# Patient Record
Sex: Female | Born: 1980
Health system: Southern US, Community
[De-identification: ages and names within clinical notes are randomized; demographics above are authoritative.]

## PROBLEM LIST (undated history)

## (undated) ENCOUNTER — Inpatient Hospital Stay (HOSPITAL_COMMUNITY): Payer: Self-pay

## (undated) DIAGNOSIS — O24419 Gestational diabetes mellitus in pregnancy, unspecified control: Secondary | ICD-10-CM

## (undated) DIAGNOSIS — E119 Type 2 diabetes mellitus without complications: Secondary | ICD-10-CM

## (undated) DIAGNOSIS — U071 COVID-19: Secondary | ICD-10-CM

## (undated) HISTORY — DX: Type 2 diabetes mellitus without complications: E11.9

## (undated) HISTORY — DX: Morbid (severe) obesity due to excess calories: E66.01

---
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
## 2012-04-12 LAB — OB RESULTS CONSOLE HGB/HCT, BLOOD: Hemoglobin: 10.8 g/dL

## 2012-04-12 LAB — OB RESULTS CONSOLE HIV ANTIBODY (ROUTINE TESTING): HIV: NONREACTIVE

## 2012-04-12 LAB — OB RESULTS CONSOLE RPR: RPR: NONREACTIVE

## 2012-07-18 ENCOUNTER — Inpatient Hospital Stay (HOSPITAL_COMMUNITY)
Admission: AD | Admit: 2012-07-18 | Discharge: 2012-07-18 | Disposition: A | Discharge status: Home / Self Care | Payer: Self-pay | Source: Ambulatory Visit | Attending: Obstetrics & Gynecology | Admitting: Obstetrics & Gynecology

## 2012-07-18 ENCOUNTER — Encounter (HOSPITAL_COMMUNITY): Payer: Self-pay

## 2012-07-18 DIAGNOSIS — O30009 Twin pregnancy, unspecified number of placenta and unspecified number of amniotic sacs, unspecified trimester: Secondary | ICD-10-CM | POA: Insufficient documentation

## 2012-07-18 DIAGNOSIS — O99810 Abnormal glucose complicating pregnancy: Secondary | ICD-10-CM | POA: Insufficient documentation

## 2012-07-18 DIAGNOSIS — O30049 Twin pregnancy, dichorionic/diamniotic, unspecified trimester: Secondary | ICD-10-CM | POA: Diagnosis present

## 2012-07-18 DIAGNOSIS — O24919 Unspecified diabetes mellitus in pregnancy, unspecified trimester: Secondary | ICD-10-CM

## 2012-07-18 DIAGNOSIS — O24419 Gestational diabetes mellitus in pregnancy, unspecified control: Secondary | ICD-10-CM | POA: Diagnosis present

## 2012-07-18 DIAGNOSIS — O4410 Placenta previa with hemorrhage, unspecified trimester: Secondary | ICD-10-CM | POA: Insufficient documentation

## 2012-07-18 HISTORY — DX: Type 2 diabetes mellitus without complications: E11.9

## 2012-07-18 LAB — DIFFERENTIAL
Lymphocytes Relative: 35 % (ref 12–46)
Monocytes Absolute: 0.5 10*3/uL (ref 0.1–1.0)
Monocytes Relative: 8 % (ref 3–12)
Neutro Abs: 3.3 10*3/uL (ref 1.7–7.7)

## 2012-07-18 LAB — CBC
HCT: 32.4 % — ABNORMAL LOW (ref 36.0–46.0)
Hemoglobin: 10.6 g/dL — ABNORMAL LOW (ref 12.0–15.0)
MCHC: 32.7 g/dL (ref 30.0–36.0)
WBC: 5.9 10*3/uL (ref 4.0–10.5)

## 2012-07-18 LAB — RPR: RPR Ser Ql: NONREACTIVE

## 2012-07-18 LAB — TYPE AND SCREEN

## 2012-07-18 LAB — RAPID HIV SCREEN (WH-MAU): Rapid HIV Screen: NONREACTIVE

## 2012-07-18 NOTE — MAU Note (Signed)
Patient is in requesting check up. [place] She states that she is a type 1 diabetic on insulin 3 times a day, twins (fertility meds to concieve). She denies any pain, vaginal bleeding or lof. She states that the placenta is low lying.

## 2012-07-18 NOTE — MAU Provider Note (Signed)
Attestation of Attending Supervision of Advanced Practitioner (CNM/NP): Evaluation and management procedures were performed by the Advanced Practitioner under my supervision and collaboration.  I have reviewed the Advanced Practitioner's note and chart, and I agree with the management and plan.  HARRAWAY-SMITH, Tashea Othman 3:22 PM

## 2012-07-18 NOTE — MAU Provider Note (Signed)
History     CSN: [id number]  Arrival date and time: 07/18/12 1143   None     No chief complaint on file.  HPI This is a 31 y.o. female at [redacted]w[redacted]d who presents to establish OB prenatal care. She is recently from [place] and received care in [place]. She admits to twin gestation via "pill" induction. Also has gestational diabetes, and is on "insulin 3 times a day".  Does not record blood sugar measurements. She has no complaints, but states was seen in [place] for contractions but did not dilate.  RN NOTE: Patient is in requesting check up. [place] She states that she is a type 1 diabetic on insulin 3 times a day, twins (fertility meds to concieve). She denies any pain, vaginal bleeding or lof. She states that the placenta is low lying.       OB History    Grav Para Term Preterm Abortions TAB SAB Ect Mult Living   1               Past Medical History  Diagnosis Date  . Diabetes mellitus without complication     type 1    History reviewed. No pertinent past surgical history.  History reviewed. No pertinent family history.  History  Substance Use Topics  . Smoking status: Never Smoker   . Smokeless tobacco: Not on file  . Alcohol Use: No    Allergies: No Known Allergies  Prescriptions prior to admission  Medication Sig Dispense Refill  . calcium carbonate 200 MG capsule Take 333 mg by mouth daily.      . ferrous gluconate (FERGON) 246 (28 FE) MG tablet Take 100 mg by mouth daily with breakfast.      . folic acid (FOLVITE) 400 MCG tablet Take 350 mcg by mouth daily.      . Magnesium 100 MG TABS Take 150 mg by mouth daily.      . progesterone (ENDOMETRIN) 100 MG vaginal insert Place 400 mg vaginally daily at 2 PM daily at 2 PM.      . pyridOXINE (VITAMIN B-6) 50 MG tablet Take 5 mg by mouth daily.        ROS See HPI  Physical Exam   Blood pressure 121/60, pulse 90, temperature 97.9 F (36.6 C),  temperature source Oral, resp. rate 16.  Physical Exam  Constitutional: She is oriented to person, place, and time. She appears well-developed and well-nourished. No distress.  Cardiovascular: Normal rate, regular rhythm and normal heart sounds.   Respiratory: Effort normal and breath sounds normal. She has no wheezes. She has no rales.  GI: Soft. She exhibits no distension. There is no tenderness. There is no rebound and no guarding.  Musculoskeletal: Normal range of motion.  Neurological: She is alert and oriented to person, place, and time.  Skin: Skin is warm and dry.  Psychiatric: She has a normal mood and affect.  FHR reassuring in both twins No contractions  MAU Course  Procedures   Assessment and Plan  A:  Twin IUP at [redacted]w[redacted]d        Low lying placenta       Gest Diabetes       Late transfer of care     P:  Consulted Dr Erin Fulling       Will draw OB panel plus Hgb A1c       Schedule outpt [place]  Records sent to be photoscanned       [facility] 07/18/2012, 12:46 PM

## 2012-07-20 LAB — HEMOGLOBINOPATHY EVALUATION
Hgb A2 Quant: 2.9 % (ref 2.2–3.2)
Hgb F Quant: 0 % (ref 0.0–2.0)
Hgb S Quant: 0 %

## 2012-07-22 ENCOUNTER — Other Ambulatory Visit (HOSPITAL_COMMUNITY): Payer: Self-pay | Admitting: Advanced Practice Midwife

## 2012-07-22 DIAGNOSIS — O30049 Twin pregnancy, dichorionic/diamniotic, unspecified trimester: Secondary | ICD-10-CM

## 2012-07-22 DIAGNOSIS — O24919 Unspecified diabetes mellitus in pregnancy, unspecified trimester: Secondary | ICD-10-CM

## 2012-07-29 ENCOUNTER — Ambulatory Visit (HOSPITAL_COMMUNITY)
Admission: RE | Admit: 2012-07-29 | Discharge: 2012-07-29 | Disposition: A | Discharge status: Home / Self Care | Payer: Self-pay | Source: Ambulatory Visit | Attending: Advanced Practice Midwife | Admitting: Advanced Practice Midwife

## 2012-07-29 ENCOUNTER — Other Ambulatory Visit (HOSPITAL_COMMUNITY): Payer: Self-pay | Admitting: Advanced Practice Midwife

## 2012-07-29 DIAGNOSIS — O30049 Twin pregnancy, dichorionic/diamniotic, unspecified trimester: Secondary | ICD-10-CM

## 2012-07-29 DIAGNOSIS — O99810 Abnormal glucose complicating pregnancy: Secondary | ICD-10-CM | POA: Insufficient documentation

## 2012-07-29 DIAGNOSIS — O30009 Twin pregnancy, unspecified number of placenta and unspecified number of amniotic sacs, unspecified trimester: Secondary | ICD-10-CM | POA: Insufficient documentation

## 2012-07-29 DIAGNOSIS — O24919 Unspecified diabetes mellitus in pregnancy, unspecified trimester: Secondary | ICD-10-CM

## 2012-08-01 ENCOUNTER — Encounter: Payer: Self-pay | Admitting: Obstetrics & Gynecology

## 2012-08-01 ENCOUNTER — Encounter: Payer: Self-pay | Attending: Obstetrics & Gynecology | Admitting: Dietician

## 2012-08-01 ENCOUNTER — Ambulatory Visit (INDEPENDENT_AMBULATORY_CARE_PROVIDER_SITE_OTHER): Payer: Self-pay | Admitting: Obstetrics & Gynecology

## 2012-08-01 VITALS — BP 135/76 | Temp 98.1°F | Wt 251.3 lb

## 2012-08-01 DIAGNOSIS — O30049 Twin pregnancy, dichorionic/diamniotic, unspecified trimester: Secondary | ICD-10-CM

## 2012-08-01 DIAGNOSIS — O24419 Gestational diabetes mellitus in pregnancy, unspecified control: Secondary | ICD-10-CM

## 2012-08-01 DIAGNOSIS — O99810 Abnormal glucose complicating pregnancy: Secondary | ICD-10-CM | POA: Insufficient documentation

## 2012-08-01 DIAGNOSIS — O30009 Twin pregnancy, unspecified number of placenta and unspecified number of amniotic sacs, unspecified trimester: Secondary | ICD-10-CM

## 2012-08-01 DIAGNOSIS — Z713 Dietary counseling and surveillance: Secondary | ICD-10-CM | POA: Insufficient documentation

## 2012-08-01 DIAGNOSIS — Z23 Encounter for immunization: Secondary | ICD-10-CM

## 2012-08-01 LAB — POCT URINALYSIS DIP (DEVICE)
Bilirubin Urine: NEGATIVE
Ketones, ur: NEGATIVE mg/dL
pH: 7 (ref 5.0–8.0)

## 2012-08-01 MED ORDER — INFLUENZA VIRUS VACC SPLIT PF IM SUSP
0.5000 mL | Freq: Once | INTRAMUSCULAR | Status: AC
  Administered 2012-08-01: 0.5 mL via INTRAMUSCULAR

## 2012-08-01 MED ORDER — TETANUS-DIPHTH-ACELL PERTUSSIS 5-2.5-18.5 LF-MCG/0.5 IM SUSP
0.5000 mL | Freq: Once | INTRAMUSCULAR | Status: AC
  Administered 2012-08-01: 0.5 mL via INTRAMUSCULAR

## 2012-08-01 NOTE — Patient Instructions (Signed)
Return to clinic for any obstetric concerns or go to MAU for evaluation  

## 2012-08-01 NOTE — Progress Notes (Signed)
Diabetes Education:  [name] pregnant with twins comes in today for GDM counseling.  Has no previous history of diabetes but, has a positive family history for DM type 2 (mother and father).  Became pregnant in [place] and there received some diabetes education and diet instruction.  Was started on Levemir 10 units at HS and Humalog 6 units AC each meal.  Currently, has 1 Levemir insulin pen and 3 Humalog insulin pens at her home.  Will probably need to be switched to NPH on a BID basis given that Levemir is not approved for use in pregnancy in the [place].  She has a One Touch Ultra meter for monitoring.  She did not bring the meter or her glucose log today.  She reports fasting glucose levels at about 110 mg/dl.  I had difficulty discovering what her post-meal glucose levels were running.  She currently has approximately 200 strips left to use.  Given that she is uninsured and the strips will run her approximately $ 100.00 for 100 strips, I provided her a True Tack meter to use when her One Touch Strips run out.  Today, on return demonstration, her glucose was 102 at 11:00 AM following a glass of milk this morning.  We reviewed the CHO restricted diet, the times and the goals of monitoring.  I was assisted by the Arabic interpreter, Lisa [name].  Lisa [name]'s sister was present and she speaks and understands [place].  Lisa [name] lives with her and she is very attentive and helpful.  Maggie Shanira Tine, RN, RD, CDE

## 2012-08-01 NOTE — Progress Notes (Signed)
First prenatal visit today in this clinic, received care in [place]. Records reviewed, problem list updated.  Diagnosed with GDM based on fasting BS of 143; HbA1C of 6.7.  She is on insulin but has not checked her BS in a while.  HbA1C on 11/18 was 5.9.  Growth scan on 11/29 only showed 2% discordance, cervix 2 cm long.  Normal physical exam, pap smear done. Patient to receive DM teaching today, flu shot, Tdap vaccine. No other complaints or concerns.  Fetal movement and labor precautions reviewed.

## 2012-08-01 NOTE — Progress Notes (Signed)
Pulse: 106

## 2012-08-08 ENCOUNTER — Ambulatory Visit (INDEPENDENT_AMBULATORY_CARE_PROVIDER_SITE_OTHER): Payer: Self-pay | Admitting: Obstetrics and Gynecology

## 2012-08-08 ENCOUNTER — Encounter: Payer: Self-pay | Admitting: Dietician

## 2012-08-08 ENCOUNTER — Inpatient Hospital Stay (HOSPITAL_COMMUNITY)
Admission: AD | Admit: 2012-08-08 | Discharge: 2012-08-08 | Disposition: A | Discharge status: Home / Self Care | Payer: Self-pay | Source: Ambulatory Visit | Attending: Obstetrics & Gynecology | Admitting: Obstetrics & Gynecology

## 2012-08-08 ENCOUNTER — Encounter (HOSPITAL_COMMUNITY): Payer: Self-pay | Admitting: *Deleted

## 2012-08-08 VITALS — BP 127/78 | Temp 96.5°F | Wt 246.2 lb

## 2012-08-08 DIAGNOSIS — O30049 Twin pregnancy, dichorionic/diamniotic, unspecified trimester: Secondary | ICD-10-CM | POA: Insufficient documentation

## 2012-08-08 DIAGNOSIS — O24419 Gestational diabetes mellitus in pregnancy, unspecified control: Secondary | ICD-10-CM

## 2012-08-08 DIAGNOSIS — O99810 Abnormal glucose complicating pregnancy: Secondary | ICD-10-CM

## 2012-08-08 DIAGNOSIS — O30009 Twin pregnancy, unspecified number of placenta and unspecified number of amniotic sacs, unspecified trimester: Secondary | ICD-10-CM | POA: Insufficient documentation

## 2012-08-08 DIAGNOSIS — O26879 Cervical shortening, unspecified trimester: Secondary | ICD-10-CM

## 2012-08-08 LAB — POCT URINALYSIS DIP (DEVICE)
Glucose, UA: NEGATIVE mg/dL
Nitrite: NEGATIVE
Urobilinogen, UA: 0.2 mg/dL (ref 0.0–1.0)

## 2012-08-08 MED ORDER — "INSULIN SYRINGE-NEEDLE U-100 31G X 5/16"" 1 ML MISC": 1.0000 | Freq: Two times a day (BID) | Status: DC

## 2012-08-08 MED ORDER — INSULIN NPH (HUMAN) (ISOPHANE) 100 UNIT/ML ~~LOC~~ SUSP: 10.0000 [IU] | Freq: Every day | SUBCUTANEOUS | Status: DC

## 2012-08-08 NOTE — MAU Note (Signed)
Sent from the clinic for her scheduled NST;

## 2012-08-08 NOTE — MAU Provider Note (Signed)
MAU Attending Note  31 y.o. G1P0 with twins at [redacted]w[redacted]d here for NST. NST performed today was reviewed and was found to be reactive X 2.  No contractions noted. Will continue recommended antenatal testing and prenatal care. No other complaints or concerns.  Patient sent home with fetal movement and labor precautions reviewed.   Jaynie Collins, MD, FACOG Attending Obstetrician & Gynecologist Faculty Practice, [facility] [place]

## 2012-08-08 NOTE — Progress Notes (Signed)
Diabetes Education:  Switching from Levemir to Humulin NPH sulin.  Has been using the insulin pen in the past.  Will be using insulin syringes.  Review of drawing up and injecting insulin using a syringe.  She relates she has seen her mother use a syringe in the past.  Completed a return demonstration for drawing up and injecting insulin using the injection pad.  She has been injecting her insulin for a number of months. Provided a BD insulin take home kit.  Maggie Lorita Forinash, RN, RD, CDE

## 2012-08-08 NOTE — Progress Notes (Signed)
Short cx. Denies PTL sx.  Revieweds/sx. Twins/ A2 GDM. Will schedule FATs> Diane unable to take today. NST in MAU.  CBGs within range for last 3 days though had mildly elevated fasting  and pc dinners before that. D/W Maggie> Finish Detemir (5 doses) then start Humulin N 10 u qhs. Nasal congestion> neosynephrine nose GTTS.

## 2012-08-08 NOTE — Addendum Note (Signed)
Addended by: Danae Orleans on: 08/08/2012 09:48 AM   Modules accepted: Orders

## 2012-08-08 NOTE — Progress Notes (Signed)
Pulse- 100  Pain/pressure- lower abd

## 2012-08-08 NOTE — Patient Instructions (Signed)
Fetal Movement Counts Patient Name: __________________________________________________ Patient Due Date: ____________________ Kick counts is highly recommended in high risk pregnancies, but it is a good idea for every pregnant woman to do. Start counting fetal movements at 28 weeks of the pregnancy. Fetal movements increase after eating a full meal or eating or drinking something sweet (the blood sugar is higher). It is also important to drink plenty of fluids (well hydrated) before doing the count. Lie on your left side because it helps with the circulation or you can sit in a comfortable chair with your arms over your belly (abdomen) with no distractions around you. DOING THE COUNT  Try to do the count the same time of day each time you do it.  Mark the day and time, then see how long it takes for you to feel 10 movements (kicks, flutters, swishes, rolls). You should have at least 10 movements within 2 hours. You will most likely feel 10 movements in much less than 2 hours. If you do not, wait an hour and count again. After a couple of days you will see a pattern.  What you are looking for is a change in the pattern or not enough counts in 2 hours. Is it taking longer in time to reach 10 movements? SEEK MEDICAL CARE IF:  You feel less than 10 counts in 2 hours. Tried twice.  No movement in one hour.  The pattern is changing or taking longer each day to reach 10 counts in 2 hours.  You feel the baby is not moving as it usually does. Date: ____________ Movements: ____________ Start time: ____________ Finish time: ____________  Date: ____________ Movements: ____________ Start time: ____________ Finish time: ____________ Date: ____________ Movements: ____________ Start time: ____________ Finish time: ____________ Date: ____________ Movements: ____________ Start time: ____________ Finish time: ____________ Date: ____________ Movements: ____________ Start time: ____________ Finish time:  ____________ Date: ____________ Movements: ____________ Start time: ____________ Finish time: ____________ Date: ____________ Movements: ____________ Start time: ____________ Finish time: ____________ Date: ____________ Movements: ____________ Start time: ____________ Finish time: ____________  Date: ____________ Movements: ____________ Start time: ____________ Finish time: ____________ Date: ____________ Movements: ____________ Start time: ____________ Finish time: ____________ Date: ____________ Movements: ____________ Start time: ____________ Finish time: ____________ Date: ____________ Movements: ____________ Start time: ____________ Finish time: ____________ Date: ____________ Movements: ____________ Start time: ____________ Finish time: ____________ Date: ____________ Movements: ____________ Start time: ____________ Finish time: ____________ Date: ____________ Movements: ____________ Start time: ____________ Finish time: ____________  Date: ____________ Movements: ____________ Start time: ____________ Finish time: ____________ Date: ____________ Movements: ____________ Start time: ____________ Finish time: ____________ Date: ____________ Movements: ____________ Start time: ____________ Finish time: ____________ Date: ____________ Movements: ____________ Start time: ____________ Finish time: ____________ Date: ____________ Movements: ____________ Start time: ____________ Finish time: ____________ Date: ____________ Movements: ____________ Start time: ____________ Finish time: ____________ Date: ____________ Movements: ____________ Start time: ____________ Finish time: ____________  Date: ____________ Movements: ____________ Start time: ____________ Finish time: ____________ Date: ____________ Movements: ____________ Start time: ____________ Finish time: ____________ Date: ____________ Movements: ____________ Start time: ____________ Finish time: ____________ Date: ____________ Movements:  ____________ Start time: ____________ Finish time: ____________ Date: ____________ Movements: ____________ Start time: ____________ Finish time: ____________ Date: ____________ Movements: ____________ Start time: ____________ Finish time: ____________ Date: ____________ Movements: ____________ Start time: ____________ Finish time: ____________  Date: ____________ Movements: ____________ Start time: ____________ Finish time: ____________ Date: ____________ Movements: ____________ Start time: ____________ Finish time: ____________ Date: ____________ Movements: ____________ Start time: ____________ Finish time: ____________ Date: ____________ Movements:   ____________ Start time: ____________ Finish time: ____________ Date: ____________ Movements: ____________ Start time: ____________ Finish time: ____________ Date: ____________ Movements: ____________ Start time: ____________ Finish time: ____________ Date: ____________ Movements: ____________ Start time: ____________ Finish time: ____________  Date: ____________ Movements: ____________ Start time: ____________ Finish time: ____________ Date: ____________ Movements: ____________ Start time: ____________ Finish time: ____________ Date: ____________ Movements: ____________ Start time: ____________ Finish time: ____________ Date: ____________ Movements: ____________ Start time: ____________ Finish time: ____________ Date: ____________ Movements: ____________ Start time: ____________ Finish time: ____________ Date: ____________ Movements: ____________ Start time: ____________ Finish time: ____________ Date: ____________ Movements: ____________ Start time: ____________ Finish time: ____________  Date: ____________ Movements: ____________ Start time: ____________ Finish time: ____________ Date: ____________ Movements: ____________ Start time: ____________ Finish time: ____________ Date: ____________ Movements: ____________ Start time: ____________ Finish  time: ____________ Date: ____________ Movements: ____________ Start time: ____________ Finish time: ____________ Date: ____________ Movements: ____________ Start time: ____________ Finish time: ____________ Date: ____________ Movements: ____________ Start time: ____________ Finish time: ____________ Date: ____________ Movements: ____________ Start time: ____________ Finish time: ____________  Date: ____________ Movements: ____________ Start time: ____________ Finish time: ____________ Date: ____________ Movements: ____________ Start time: ____________ Finish time: ____________ Date: ____________ Movements: ____________ Start time: ____________ Finish time: ____________ Date: ____________ Movements: ____________ Start time: ____________ Finish time: ____________ Date: ____________ Movements: ____________ Start time: ____________ Finish time: ____________ Date: ____________ Movements: ____________ Start time: ____________ Finish time: ____________ Document Released: 09/16/2006 Document Revised: 11/09/2011 Document Reviewed: 03/19/2009 Exit[employer] Patient Information 2013 [employer].  

## 2012-08-11 ENCOUNTER — Emergency Department (HOSPITAL_BASED_OUTPATIENT_CLINIC_OR_DEPARTMENT_OTHER)
Admission: EM | Admit: 2012-08-11 | Discharge: 2012-08-11 | Disposition: A | Discharge status: Home / Self Care | Payer: Self-pay | Attending: Emergency Medicine | Admitting: Emergency Medicine

## 2012-08-11 ENCOUNTER — Encounter (HOSPITAL_BASED_OUTPATIENT_CLINIC_OR_DEPARTMENT_OTHER): Payer: Self-pay | Admitting: *Deleted

## 2012-08-11 DIAGNOSIS — O24919 Unspecified diabetes mellitus in pregnancy, unspecified trimester: Secondary | ICD-10-CM | POA: Insufficient documentation

## 2012-08-11 DIAGNOSIS — O9989 Other specified diseases and conditions complicating pregnancy, childbirth and the puerperium: Secondary | ICD-10-CM | POA: Insufficient documentation

## 2012-08-11 DIAGNOSIS — O30009 Twin pregnancy, unspecified number of placenta and unspecified number of amniotic sacs, unspecified trimester: Secondary | ICD-10-CM | POA: Insufficient documentation

## 2012-08-11 DIAGNOSIS — Z794 Long term (current) use of insulin: Secondary | ICD-10-CM | POA: Insufficient documentation

## 2012-08-11 DIAGNOSIS — J209 Acute bronchitis, unspecified: Secondary | ICD-10-CM | POA: Insufficient documentation

## 2012-08-11 DIAGNOSIS — J029 Acute pharyngitis, unspecified: Secondary | ICD-10-CM | POA: Insufficient documentation

## 2012-08-11 DIAGNOSIS — H9209 Otalgia, unspecified ear: Secondary | ICD-10-CM | POA: Insufficient documentation

## 2012-08-11 MED ORDER — AMOXICILLIN 500 MG PO CAPS: 500.0000 mg | ORAL_CAPSULE | Freq: Three times a day (TID) | ORAL | Status: DC

## 2012-08-11 NOTE — ED Notes (Signed)
Pt amb to room 6 with quick steady gait in nad. Pt reports cough, congestion and sob x 3 days with ear pain and sore throat.

## 2012-08-11 NOTE — ED Provider Notes (Signed)
History     CSN: [id number]  Arrival date & time 08/11/12  0907   First MD Initiated Contact with Patient 08/11/12 1022      Chief Complaint  Patient presents with  . Cough  . Nasal Congestion  . Otalgia    (Consider location/radiation/quality/duration/timing/severity/associated sxs/prior treatment) HPI Comments: Patient is a 31 year old woman from [place] who his pregnant with twins, estimated date of confinement 10/05/2012. She is followed for her pregnancy at [facility]. She is diabetic, on insulin. Today's blood sugar was reportedly 81. She's had a bad cold with ear and throat pain, and a productive cough. She's been sick for over a week with this illness.  Patient is a 31 y.o. female presenting with cough and ear pain. The history is provided by the patient.  Cough This is a new (Onset 1 week ago.) problem. The problem occurs constantly. The problem has not changed since onset.The cough is productive of sputum. There has been no fever. Associated symptoms include ear pain and sore throat. Pertinent negatives include no chills. She has tried nothing for the symptoms. She is not a smoker.  Otalgia Associated symptoms include sore throat and cough.    Past Medical History  Diagnosis Date  . Diabetes mellitus without complication     type 1    History reviewed. No pertinent past surgical history.  Family History  Problem Relation Age of Onset  . Other Neg Hx     History  Substance Use Topics  . Smoking status: Never Smoker   . Smokeless tobacco: Not on file  . Alcohol Use: No    OB History    Grav Para Term Preterm Abortions TAB SAB Ect Mult Living   1               Review of Systems  Constitutional: Negative for fever and chills.  HENT: Positive for ear pain and sore throat.   Respiratory: Positive for cough.   Cardiovascular: Negative.   Gastrointestinal: Negative.   Genitourinary:       Is pregnant with twins.  Musculoskeletal: Negative.    Skin: Negative.   Neurological: Negative.   Psychiatric/Behavioral: Negative.     Allergies  Review of patient's allergies indicates no known allergies.  Home Medications   Current Outpatient Rx  Name  Route  Sig  Dispense  Refill  . CALCIUM CARBONATE 200 MG PO CAPS   Oral   Take 333 mg by mouth daily.         Marland Kitchen FERROUS GLUCONATE IRON 246 (28 FE) MG PO TABS   Oral   Take 100 mg by mouth daily with breakfast.         . INSULIN DETEMIR 100 UNIT/ML [place] SOLN   Subcutaneous   Inject 10 Units into the skin at bedtime.         . INSULIN LISPRO (HUMAN) 100 UNIT/ML [place] SOLN   Subcutaneous   Inject 6 Units into the skin 3 (three) times daily before meals.         . INSULIN ISOPHANE HUMAN 100 UNIT/ML [place] SUSP   Subcutaneous   Inject 10 Units into the skin at bedtime.         . INSULIN SYRINGE-NEEDLE U-100 31G X 5/16" 1 ML MISC   Does not apply   1 Syringe by Does not apply route 2 (two) times daily.   100 each   3   . PRENATAL 27-0.8 MG PO TABS   Oral  Take 1 tablet by mouth daily.           BP 121/72  Pulse 84  Temp 97.9 F (36.6 C) (Oral)  Resp 18  Ht 5\' 6"  (1.676 m)  Wt 246 lb (111.585 kg)  BMI 39.71 kg/m2  SpO2 97%  Physical Exam  Nursing note and vitals reviewed. Constitutional: She is oriented to person, place, and time. She appears well-developed and well-nourished. No distress.  HENT:  Head: Normocephalic and atraumatic.  Right Ear: External ear normal.  Left Ear: External ear normal.  Mouth/Throat: Oropharynx is clear and moist.       Hears her tear by cerumen. She has periodic rhinorrhea. The throat is mildly red.  Eyes: Conjunctivae normal and EOM are normal. Pupils are equal, round, and reactive to light.  Neck: Normal range of motion. Neck supple.  Cardiovascular: Normal rate, regular rhythm and normal heart sounds.   Pulmonary/Chest: Effort normal and breath sounds normal. Respiratory distress: she is gravid, with the uterus above her  umbilicus. The abdomen is nontender.  Abdominal: Soft.  Musculoskeletal: Normal range of motion. She exhibits no edema.  Lymphadenopathy:    She has no cervical adenopathy.  Neurological: She is alert and oriented to person, place, and time.       No sensory or motor deficit.  Skin: Skin is warm and dry.  Psychiatric: She has a normal mood and affect. Her behavior is normal.    ED Course  Procedures (including critical care time)  Rx for bronchitis with amoxicillin500 mg tid x 7 days.    1. Acute bronchitis   2. Twin pregnancy        Carleene Cooper III, MD 08/11/12 1040

## 2012-08-12 ENCOUNTER — Ambulatory Visit (INDEPENDENT_AMBULATORY_CARE_PROVIDER_SITE_OTHER): Payer: Self-pay | Admitting: *Deleted

## 2012-08-12 VITALS — BP 122/77

## 2012-08-12 DIAGNOSIS — O99810 Abnormal glucose complicating pregnancy: Secondary | ICD-10-CM

## 2012-08-12 DIAGNOSIS — O30009 Twin pregnancy, unspecified number of placenta and unspecified number of amniotic sacs, unspecified trimester: Secondary | ICD-10-CM

## 2012-08-12 MED ORDER — FLUCONAZOLE 150 MG PO TABS: 150.0000 mg | ORAL_TABLET | Freq: Once | ORAL | Status: DC

## 2012-08-12 NOTE — Progress Notes (Signed)
P - 97 

## 2012-08-15 ENCOUNTER — Ambulatory Visit (INDEPENDENT_AMBULATORY_CARE_PROVIDER_SITE_OTHER): Payer: Self-pay | Admitting: Family Medicine

## 2012-08-15 ENCOUNTER — Encounter (HOSPITAL_COMMUNITY): Payer: Self-pay

## 2012-08-15 ENCOUNTER — Inpatient Hospital Stay (HOSPITAL_COMMUNITY)
Admission: AD | Admit: 2012-08-15 | Discharge: 2012-08-15 | Disposition: A | Discharge status: Home / Self Care | Payer: Self-pay | Source: Ambulatory Visit | Attending: Obstetrics & Gynecology | Admitting: Obstetrics & Gynecology

## 2012-08-15 VITALS — BP 127/72 | Wt 247.4 lb

## 2012-08-15 DIAGNOSIS — O30049 Twin pregnancy, dichorionic/diamniotic, unspecified trimester: Secondary | ICD-10-CM

## 2012-08-15 DIAGNOSIS — O30009 Twin pregnancy, unspecified number of placenta and unspecified number of amniotic sacs, unspecified trimester: Secondary | ICD-10-CM | POA: Insufficient documentation

## 2012-08-15 DIAGNOSIS — O99891 Other specified diseases and conditions complicating pregnancy: Secondary | ICD-10-CM | POA: Insufficient documentation

## 2012-08-15 DIAGNOSIS — M545 Low back pain, unspecified: Secondary | ICD-10-CM | POA: Insufficient documentation

## 2012-08-15 DIAGNOSIS — O24419 Gestational diabetes mellitus in pregnancy, unspecified control: Secondary | ICD-10-CM

## 2012-08-15 DIAGNOSIS — O99810 Abnormal glucose complicating pregnancy: Secondary | ICD-10-CM | POA: Insufficient documentation

## 2012-08-15 DIAGNOSIS — O0990 Supervision of high risk pregnancy, unspecified, unspecified trimester: Secondary | ICD-10-CM | POA: Insufficient documentation

## 2012-08-15 DIAGNOSIS — O3430 Maternal care for cervical incompetence, unspecified trimester: Secondary | ICD-10-CM

## 2012-08-15 DIAGNOSIS — O479 False labor, unspecified: Secondary | ICD-10-CM

## 2012-08-15 LAB — POCT URINALYSIS DIP (DEVICE)
Bilirubin Urine: NEGATIVE
Glucose, UA: NEGATIVE mg/dL
Nitrite: NEGATIVE
Specific Gravity, Urine: >=1.03 (ref 1.005–1.030)

## 2012-08-15 MED ORDER — BETAMETHASONE SOD PHOS & ACET 6 (3-3) MG/ML IJ SUSP
12.0000 mg | Freq: Once | INTRAMUSCULAR | Status: AC
  Administered 2012-08-15: 12 mg via INTRAMUSCULAR
  Filled 2012-08-15: qty 2

## 2012-08-15 NOTE — MAU Provider Note (Signed)
History     CSN: [id number]  Arrival date and time: 08/15/12 1015   None     No chief complaint on file.  HPI 31 y.o. G1P0 at [redacted]w[redacted]d with di/di twins. Sent from clinic today for pelvic pain/pressure and low back pain since yesterday. No bleeding, contractions or loss of fluid. Babies moving well. Vaginal pain now gone.  Found to be 2.5/90/-1 on exam in clinic. Denies dysuria or vaginal discharge. No N/V.   A2GDM on insulin, did not bring lot today. States fasting today 103, yesterday 135 but generally 80-90s. PP highest 95.    OB History    Grav Para Term Preterm Abortions TAB SAB Ect Mult Living   1               Past Medical History  Diagnosis Date  . Diabetes mellitus without complication     type 1    History reviewed. No pertinent past surgical history.  Family History  Problem Relation Age of Onset  . Other Neg Hx     History  Substance Use Topics  . Smoking status: Never Smoker   . Smokeless tobacco: Not on file  . Alcohol Use: No    Allergies: No Known Allergies  Prescriptions prior to admission  Medication Sig Dispense Refill  . amoxicillin (AMOXIL) 500 MG capsule Take 500 mg by mouth 3 (three) times daily.      . ferrous gluconate (FERGON) 246 (28 FE) MG tablet Take 100 mg by mouth daily with breakfast.      . insulin detemir (LEVEMIR) 100 UNIT/ML injection Inject 10 Units into the skin at bedtime.      . insulin lispro (HUMALOG) 100 UNIT/ML injection Inject 6 Units into the skin 3 (three) times daily before meals.      . insulin NPH (HUMULIN N,NOVOLIN N) 100 UNIT/ML injection Inject 10 Units into the skin at bedtime.      . Prenatal Vit-Fe Fumarate-FA (MULTIVITAMIN-PRENATAL) 27-0.8 MG TABS Take 1 tablet by mouth daily.      . [DISCONTINUED] amoxicillin (AMOXIL) 500 MG capsule Take 1 capsule (500 mg total) by mouth 3 (three) times daily.  21 capsule  0  . Insulin Syringe-Needle U-100 (BD INSULIN SYRINGE ULTRAFINE) 31G X 5/16" 1 ML MISC 1 Syringe by  Does not apply route 2 (two) times daily.  100 each  3    Review of Systems  Constitutional: Negative for fever and chills.  Eyes: Negative for blurred vision and double vision.  Respiratory: Negative for shortness of breath.   Gastrointestinal: Positive for abdominal pain. Negative for nausea, vomiting and diarrhea.  Genitourinary: Negative for dysuria and urgency.  Musculoskeletal: Positive for back pain.  Neurological: Negative for dizziness and headaches.   Physical Exam   Blood pressure 123/70, pulse 89, temperature 97.5 F (36.4 C), temperature source Oral, resp. rate 18, height 5\' 6"  (1.676 m), weight 112.038 kg (247 lb).  Physical Exam  Constitutional: She is oriented to person, place, and time. She appears well-developed and well-nourished. No distress.  HENT:  Head: Normocephalic and atraumatic.  Eyes: Conjunctivae normal and EOM are normal.  Neck: Normal range of motion. Neck supple.  Cardiovascular: Normal rate.   Respiratory: Effort normal. No respiratory distress.  GI: Soft. There is no tenderness. There is no rebound and no guarding.  Genitourinary: Vagina normal. No vaginal discharge found.  Musculoskeletal: Normal range of motion. She exhibits no edema and no tenderness.  Neurological: She is alert and oriented to person,  place, and time.  Skin: Skin is warm and dry.  Psychiatric: She has a normal mood and affect.   Cervix: 2.5/90/-1  Results for orders placed in visit on 08/15/12 (from the past 24 hour(s))  POCT URINALYSIS DIP (DEVICE)     Status: Abnormal   Collection Time   08/15/12  9:01 AM      Component Value Range   Glucose, UA NEGATIVE  NEGATIVE mg/dL   Bilirubin Urine NEGATIVE  NEGATIVE   Ketones, ur NEGATIVE  NEGATIVE mg/dL   Specific Gravity, Urine >=1.030  1.005 - 1.030   Hgb urine dipstick TRACE (*) NEGATIVE   pH 7.0  5.0 - 8.0   Protein, ur NEGATIVE  NEGATIVE mg/dL   Urobilinogen, UA 0.2  0.0 - 1.0 mg/dL   Nitrite NEGATIVE  NEGATIVE    Leukocytes, UA SMALL (*) NEGATIVE     MAU Course  Procedures  NST  135, moderate variability, accels present, no decels 130, moderate variability, accels present, no decels TOCO;  Ctx q 10-15 min not felt by pt  Assessment and Plan  31 y.o. G1P0 at [redacted]w[redacted]d with back pain - Cervical dilation 70-90%, 2.5 cm - Betamethasone given in ED today. Return tomorrow for second dose and recheck of cervix - GDM - NST on Friday, pt to take blood sugar logs at that time. - Discussed labor precautions at length with patient and importance of returning tomorrow for second BMZ.  Discussed with Dr. Jennings Books and Dr. Shawnie Pons, in agreement with plan.     Napoleon Form 08/15/2012, 12:57 PM

## 2012-08-15 NOTE — Progress Notes (Signed)
Dr ferry notified of tracing, ctx pattern. She will come to  MAU to see patient.

## 2012-08-15 NOTE — Progress Notes (Signed)
Brings no book today Reports FBS 90-99 2 hour pp 120-125 C/o low abd. Pain and pressure--2-3 cm on vag. Exam--to [facility]U for further eval, NST, decision on admission.

## 2012-08-15 NOTE — MAU Note (Signed)
Patient is sent from the clinic for efm monitoring and cervical check due to c/o possible contractions. She denies vaginal bleeding or lof. Patient reports good fetal movements.

## 2012-08-15 NOTE — Patient Instructions (Signed)
Gestational Diabetes Mellitus Gestational diabetes mellitus (GDM) is diabetes that occurs only during pregnancy. This happens when the body cannot properly handle the glucose (sugar) that increases in the blood after eating. During pregnancy, insulin resistance (reduced sensitivity to insulin) occurs because of the release of hormones from the placenta. Usually, the pancreas of pregnant women produces enough insulin to overcome the resistance that occurs. However, in gestational diabetes, the insulin is there but it does not work effectively. If the resistance is severe enough that the pancreas does not produce enough insulin, extra glucose builds up in the blood.  WHO IS AT RISK FOR DEVELOPING GESTATIONAL DIABETES?  Women with a history of diabetes in the family.  Women over age 25.  Women who are overweight.  Women in certain ethnic groups (Hispanic, African American, Native American, Asian and Pacific Islander). WHAT CAN HAPPEN TO THE BABY? If the mother's blood glucose is too high while she is pregnant, the extra sugar will travel through the umbilical cord to the baby. Some of the problems the baby may have are:  Large Baby - If the baby receives too much sugar, the baby will gain more weight. This may cause the baby to be too large to be born normally (vaginally) and a Cesarean section (C-section) may be needed.  Low Blood Glucose (hypoglycemia)  The baby makes extra insulin, in response to the extra sugar its gets from its mother. When the baby is born and no longer needs this extra insulin, the baby's blood glucose level may drop.  Jaundice (yellow coloring of the skin and eyes)  This is fairly common in babies. It is caused from a build-up of the chemical called bilirubin. This is rarely serious, but is seen more often in babies whose mothers had gestational diabetes. RISKS TO THE MOTHER Women who have had gestational diabetes may be at higher risk for some problems,  including:  Preeclampsia or toxemia, which includes problems with high blood pressure. Blood pressure and protein levels in the urine must be checked frequently.  Infections.  Cesarean section (C-section) for delivery.  Developing Type 2 diabetes later in life. About 30-50% will develop diabetes later, especially if obese. DIAGNOSIS  The hormones that cause insulin resistance are highest at about 24-28 weeks of pregnancy. If symptoms are experienced, they are much like symptoms you would normally expect during pregnancy.  GDM is often diagnosed using a two part method: 1. After 24-28 weeks of pregnancy, the woman drinks a glucose solution and takes a blood test. If the glucose level is high, a second test will be given. 2. Oral Glucose Tolerance Test (OGTT) which is 3 hours long  After not eating overnight, the blood glucose is checked. The woman drinks a glucose solution, and hourly blood glucose tests are taken. If the woman has risk factors for GDM, the caregiver may test earlier than 24 weeks of pregnancy. TREATMENT  Treatment of GDM is directed at keeping the mother's blood glucose level normal, and may include:  Meal planning.  Taking insulin or other medicine to control your blood glucose level.  Exercise.  Keeping a daily record of the foods you eat.  Blood glucose monitoring and keeping a record of your blood glucose levels.  May monitor ketone levels in the urine, although this is no longer considered necessary in most pregnancies. HOME CARE INSTRUCTIONS  While you are pregnant:  Follow your caregiver's advice regarding your prenatal appointments, meal planning, exercise, medicines, vitamins, blood and other tests, and physical   activities.  Keep a record of your meals, blood glucose tests, and the amount of insulin you are taking (if any). Show this to your caregiver at every prenatal visit.  If you have GDM, you may have problems with hypoglycemia (low blood glucose).  You may suspect this if you become suddenly dizzy, feel shaky, and/or weak. If you think this is happening and you have a glucose meter, try to test your blood glucose level. Follow your caregiver's advice for when and how to treat your low blood glucose. Generally, the 15:15 rule is followed: Treat by consuming 15 grams of carbohydrates, wait 15 minutes, and recheck blood glucose. Examples of 15 grams of carbohydrates are:  1 cup skim or low-fat milk.   cup juice.  3-4 glucose tablets.  5-6 hard candies.  1 small box raisins.   cup regular soda pop.  Practice good hygiene, to avoid infections.  Do not smoke. SEEK MEDICAL CARE IF:   You develop abnormal vaginal discharge, with or without itching.  You become weak and tired more than expected.  You seem to sweat a lot.  You have a sudden increase in weight, 5 pounds or more in one week.  You are losing weight, 3 pounds or more in a week.  Your blood glucose level is high, and you need instructions on what to do about it. SEEK IMMEDIATE MEDICAL CARE IF:   You develop a severe headache.  You faint or pass out.  You develop nausea and vomiting.  You become disoriented or confused.  You have a convulsion.  You develop vision problems.  You develop stomach pain.  You develop vaginal bleeding.  You develop uterine contractions.  You have leaking or a gush of fluid from the vagina. AFTER YOU HAVE THE BABY:  Go to all of your follow-up appointments, and have blood tests as advised by your caregiver.  Maintain a healthy lifestyle, to prevent diabetes in the future. This includes:  Following a healthy meal plan.  Controlling your weight.  Getting enough exercise and proper rest.  Do not smoke.  Breastfeed your baby if you can. This will lower the chance of you and your baby developing diabetes later in life. For more information about diabetes, go to the American Diabetes Association at:  www.americandiabetesassociation.org. For more information about gestational diabetes, go to the American Congress of Obstetricians and Gynecologists at: www.acog.org. Document Released: 11/23/2000 Document Revised: 11/09/2011 Document Reviewed: 06/17/2009 ExitCare Patient Information 2013 [employer].  

## 2012-08-15 NOTE — MAU Provider Note (Signed)
Attestation of Attending Supervision of Advanced Practitioner (CNM/NP): Evaluation and management procedures were performed by the Advanced Practitioner under my supervision and collaboration.  I have reviewed the Advanced Practitioner's note and chart, and I agree with the management and plan.  HARRAWAY-SMITH, Loui Massenburg 7:40 PM     

## 2012-08-15 NOTE — Progress Notes (Signed)
P = 83   Pt reports increasing back pain and pelvic pressure since yesterday.   Pt sent to MAU for PTL evaluation. Report called to Darel Hong, [place]

## 2012-08-16 ENCOUNTER — Inpatient Hospital Stay (HOSPITAL_COMMUNITY)
Admission: AD | Admit: 2012-08-16 | Discharge: 2012-08-16 | Disposition: A | Discharge status: Home / Self Care | Payer: Self-pay | Source: Ambulatory Visit | Attending: Obstetrics and Gynecology | Admitting: Obstetrics and Gynecology

## 2012-08-16 DIAGNOSIS — O4700 False labor before 37 completed weeks of gestation, unspecified trimester: Secondary | ICD-10-CM | POA: Insufficient documentation

## 2012-08-16 MED ORDER — BETAMETHASONE SOD PHOS & ACET 6 (3-3) MG/ML IJ SUSP
12.0000 mg | Freq: Once | INTRAMUSCULAR | Status: AC
  Administered 2012-08-16: 12 mg via INTRAMUSCULAR
  Filled 2012-08-16: qty 2

## 2012-08-16 NOTE — MAU Provider Note (Signed)
Attestation of Attending Supervision of Advanced Practitioner (CNM/NP): Evaluation and management procedures were performed by the Advanced Practitioner under my supervision and collaboration.  I have reviewed the Advanced Practitioner's note and chart, and I agree with the management and plan.  Mieko Kneebone 08/16/2012 6:28 PM

## 2012-08-16 NOTE — MAU Provider Note (Signed)
HPI: Lisa [name] is a 31 y.o. year old G1P0 female at [redacted]w[redacted]d weeks gestation who presents to MAU for second BMZ dose. Seen in MAU yesterday for PTL eval. Cervix 2-3/90 at that time. Denies pain today.   Dilation: 2.5 Effacement (%): 90 Cervical Position: Posterior Station: -1 Presentation: Vertex Exam by:: Ivonne Andrew CNM  F/U NST in [facility] 12/120/13.  [place], CNM 08/16/2012 3:01 PM

## 2012-08-16 NOTE — MAU Note (Signed)
Pt here for 2nd dose of betamethasone.  Pt denies any pain or bleeding today.

## 2012-08-19 ENCOUNTER — Telehealth: Payer: Self-pay | Admitting: Family Medicine

## 2012-08-19 ENCOUNTER — Encounter: Payer: Self-pay | Admitting: Obstetrics & Gynecology

## 2012-08-19 ENCOUNTER — Ambulatory Visit (HOSPITAL_COMMUNITY)
Admission: RE | Admit: 2012-08-19 | Discharge: 2012-08-19 | Disposition: A | Discharge status: Home / Self Care | Payer: Self-pay | Source: Ambulatory Visit | Attending: Family Medicine | Admitting: Family Medicine

## 2012-08-19 ENCOUNTER — Ambulatory Visit (INDEPENDENT_AMBULATORY_CARE_PROVIDER_SITE_OTHER): Payer: Self-pay | Admitting: *Deleted

## 2012-08-19 ENCOUNTER — Other Ambulatory Visit: Payer: Self-pay | Admitting: Obstetrics & Gynecology

## 2012-08-19 VITALS — BP 124/78 | Wt 248.2 lb

## 2012-08-19 DIAGNOSIS — O30009 Twin pregnancy, unspecified number of placenta and unspecified number of amniotic sacs, unspecified trimester: Secondary | ICD-10-CM | POA: Insufficient documentation

## 2012-08-19 DIAGNOSIS — O99810 Abnormal glucose complicating pregnancy: Secondary | ICD-10-CM

## 2012-08-19 DIAGNOSIS — O30049 Twin pregnancy, dichorionic/diamniotic, unspecified trimester: Secondary | ICD-10-CM | POA: Insufficient documentation

## 2012-08-19 MED ORDER — PROGESTERONE MICRONIZED 200 MG PO CAPS: ORAL_CAPSULE | ORAL | Status: DC

## 2012-08-19 NOTE — Progress Notes (Signed)
12/13 NST reviewed and category I x 2

## 2012-08-19 NOTE — Progress Notes (Signed)
P = 80   Labor sx reviewed- interpreter present.

## 2012-08-19 NOTE — Telephone Encounter (Signed)
See note.  Called about rx not at pharmacy. Prometrium. Called [facility] and reordered. Pt notified to pick up tonight.

## 2012-08-22 ENCOUNTER — Encounter: Payer: Self-pay | Admitting: Dietician

## 2012-08-22 ENCOUNTER — Ambulatory Visit (INDEPENDENT_AMBULATORY_CARE_PROVIDER_SITE_OTHER): Payer: Self-pay | Admitting: Family Medicine

## 2012-08-22 ENCOUNTER — Telehealth: Payer: Self-pay | Admitting: *Deleted

## 2012-08-22 VITALS — BP 139/89 | Temp 97.0°F | Wt 246.3 lb

## 2012-08-22 DIAGNOSIS — O24419 Gestational diabetes mellitus in pregnancy, unspecified control: Secondary | ICD-10-CM

## 2012-08-22 DIAGNOSIS — O99810 Abnormal glucose complicating pregnancy: Secondary | ICD-10-CM

## 2012-08-22 DIAGNOSIS — O30049 Twin pregnancy, dichorionic/diamniotic, unspecified trimester: Secondary | ICD-10-CM

## 2012-08-22 LAB — POCT URINALYSIS DIP (DEVICE)
Glucose, UA: NEGATIVE mg/dL
Ketones, ur: NEGATIVE mg/dL
Specific Gravity, Urine: 1.025 (ref 1.005–1.030)
pH: 7 (ref 5.0–8.0)

## 2012-08-22 NOTE — Progress Notes (Signed)
Pt was able to pick up progesterone and begin using on 12/20.  Pt has had URI sx x2 wks, has been taking OTC sudafed and delsym- now has cough and chest congestion.

## 2012-08-22 NOTE — Progress Notes (Signed)
On Prometrium NST reviewed and reactive x 2. No book today Reports BS was 85 this am. Advised to bring book! U/S 12/20 A-1910 gms, vtx, nml fluid, B-1852gms vtx nml fluid, cx 1 cm PTL precautions given.

## 2012-08-22 NOTE — Telephone Encounter (Signed)
A female left 2 messages 08/19/12 stating she was Carmelina's sister and was calling for her. States that Dayla has an ultrasound 08/19/12 and ws supposed to pick up a medicine and start taking it and it was not at the pharmacy.  Called Don's phone number with [employer] and was told by a female identifying herself as Nougoud's sister that she was at the clinic.  Called pharmacy and heard message it is closed until 9 am.  Will attempt to follow up with Armida at her visit today.

## 2012-08-22 NOTE — Progress Notes (Signed)
Diabetes Education:  Accompanied by sister and interpreter [place].  Provided 1 box strips [id number] Exp: 2014/10/29 and 1 box of lancets Lot: [id number]  Exp: 2016/02/28.  Maggie Midas Daughety, RN, RD, CDE

## 2012-08-22 NOTE — Patient Instructions (Signed)
Breastfeeding Deciding to breastfeed is one of the best choices you can make for you and your baby. The information that follows gives a brief overview of the benefits of breastfeeding as well as common topics surrounding breastfeeding. BENEFITS OF BREASTFEEDING For the baby  The first milk (colostrum) helps the baby's digestive system function better.   There are antibodies in the mother's milk that help the baby fight off infections.   The baby has a lower incidence of asthma, allergies, and sudden infant death syndrome (SIDS).   The nutrients in breast milk are better for the baby than infant formulas, and breast milk helps the baby's brain grow better.   Babies who breastfeed have less gas, colic, and constipation.  For the mother  Breastfeeding helps develop a very special bond between the mother and her baby.   Breastfeeding is convenient, always available at the correct temperature, and costs nothing.   Breastfeeding burns calories in the mother and helps her lose weight that was gained during pregnancy.   Breastfeeding makes the uterus contract back down to normal size faster and slows bleeding following delivery.   Breastfeeding mothers have a lower risk of developing breast cancer.  BREASTFEEDING FREQUENCY  A healthy, full-term baby may breastfeed as often as every hour or space his or her feedings to every 3 hours.   Watch your baby for signs of hunger. Nurse your baby if he or she shows signs of hunger. How often you nurse will vary from baby to baby.   Nurse as often as the baby requests, or when you feel the need to reduce the fullness of your breasts.   Awaken the baby if it has been 3 4 hours since the last feeding.   Frequent feeding will help the mother make more milk and will help prevent problems, such as sore nipples and engorgement of the breasts.  BABY'S POSITION AT THE BREAST  Whether lying down or sitting, be sure that the baby's tummy is  facing your tummy.   Support the breast with 4 fingers underneath the breast and the thumb above. Make sure your fingers are well away from the nipple and baby's mouth.   Stroke the baby's lips gently with your finger or nipple.   When the baby's mouth is open wide enough, place all of your nipple and as much of the areola as possible into your baby's mouth.   Pull the baby in close so the tip of the nose and the baby's cheeks touch the breast during the feeding.  FEEDINGS AND SUCTION  The length of each feeding varies from baby to baby and from feeding to feeding.   The baby must suck about 2 3 minutes for your milk to get to him or her. This is called a "let down." For this reason, allow the baby to feed on each breast as long as he or she wants. Your baby will end the feeding when he or she has received the right balance of nutrients.   To break the suction, put your finger into the corner of the baby's mouth and slide it between his or her gums before removing your breast from his or her mouth. This will help prevent sore nipples.  HOW TO TELL WHETHER YOUR BABY IS GETTING ENOUGH BREAST MILK. Wondering whether or not your baby is getting enough milk is a common concern among mothers. You can be assured that your baby is getting enough milk if:   Your baby is actively   sucking and you hear swallowing.   Your baby seems relaxed and satisfied after a feeding.   Your baby nurses at least 8 12 times in a 24 hour time period. Nurse your baby until he or she unlatches or falls asleep at the first breast (at least 10 20 minutes), then offer the second side.   Your baby is wetting 5 6 disposable diapers (6 8 cloth diapers) in a 24 hour period by 5 6 days of age.   Your baby is having at least 3 4 stools every 24 hours for the first 6 weeks. The stool should be soft and yellow.   Your baby should gain 4 7 ounces per week after he or she is 4 days old.   Your breasts feel softer  after nursing.  REDUCING BREAST ENGORGEMENT  In the first week after your baby is born, you may experience signs of breast engorgement. When breasts are engorged, they feel heavy, warm, full, and may be tender to the touch. You can reduce engorgement if you:   Nurse frequently, every 2 3 hours. Mothers who breastfeed early and often have fewer problems with engorgement.   Place light ice packs on your breasts for 10 20 minutes between feedings. This reduces swelling. Wrap the ice packs in a lightweight towel to protect your skin. Bags of frozen vegetables work well for this purpose.   Take a warm shower or apply warm, moist heat to your breast for 5 10 minutes just before each feeding. This increases circulation and helps the milk flow.   Gently massage your breast before and during the feeding. Using your finger tips, massage from the chest wall towards your nipple in a circular motion.   Make sure that the baby empties at least one breast at every feeding before switching sides.   Use a breast pump to empty the breasts if your baby is sleepy or not nursing well. You may also want to pump if you are returning to work oryou feel you are getting engorged.   Avoid bottle feeds, pacifiers, or supplemental feedings of water or juice in place of breastfeeding. Breast milk is all the food your baby needs. It is not necessary for your baby to have water or formula. In fact, to help your breasts make more milk, it is best not to give your baby supplemental feedings during the early weeks.   Be sure the baby is latched on and positioned properly while breastfeeding.   Wear a supportive bra, avoiding underwire styles.   Eat a balanced diet with enough fluids.   Rest often, relax, and take your prenatal vitamins to prevent fatigue, stress, and anemia.  If you follow these suggestions, your engorgement should improve in 24 48 hours. If you are still experiencing difficulty, call your  lactation consultant or caregiver.  CARING FOR YOURSELF Take care of your breasts  Bathe or shower daily.   Avoid using soap on your nipples.   Start feedings on your left breast at one feeding and on your right breast at the next feeding.   You will notice an increase in your milk supply 2 5 days after delivery. You may feel some discomfort from engorgement, which makes your breasts very firm and often tender. Engorgement "peaks" out within 24 48 hours. In the meantime, apply warm moist towels to your breasts for 5 10 minutes before feeding. Gentle massage and expression of some milk before feeding will soften your breasts, making it easier for your   baby to latch on.   Wear a well-fitting nursing bra, and air dry your nipples for a 3 4minutes after each feeding.   Only use cotton bra pads.   Only use pure lanolin on your nipples after nursing. You do not need to wash it off before feeding the baby again. Another option is to express a few drops of breast milk and gently massage it into your nipples.  Take care of yourself  Eat well-balanced meals and nutritious snacks.   Drinking milk, fruit juice, and water to satisfy your thirst (about 8 glasses a day).   Get plenty of rest.  Avoid foods that you notice affect the baby in a bad way.  SEEK MEDICAL CARE IF:   You have difficulty with breastfeeding and need help.   You have a hard, red, sore area on your breast that is accompanied by a fever.   Your baby is too sleepy to eat well or is having trouble sleeping.   Your baby is wetting less than 6 diapers a day, by 5 days of age.   Your baby's skin or white part of his or her eyes is more yellow than it was in the hospital.   You feel depressed.  Document Released: 08/17/2005 Document Revised: 02/16/2012 Document Reviewed: 11/15/2011 ExitCare Patient Information 2013 [employer].  

## 2012-08-22 NOTE — Progress Notes (Signed)
Pulse- 105  Pain/pressure- lower abd

## 2012-08-22 NOTE — Telephone Encounter (Signed)
Per nurse upon checkin today patient states she did get the prometrium on Friday and is taking it.

## 2012-08-26 ENCOUNTER — Ambulatory Visit (INDEPENDENT_AMBULATORY_CARE_PROVIDER_SITE_OTHER): Payer: Self-pay | Admitting: *Deleted

## 2012-08-26 VITALS — BP 123/67 | Wt 247.8 lb

## 2012-08-26 DIAGNOSIS — O30009 Twin pregnancy, unspecified number of placenta and unspecified number of amniotic sacs, unspecified trimester: Secondary | ICD-10-CM

## 2012-08-26 DIAGNOSIS — O99810 Abnormal glucose complicating pregnancy: Secondary | ICD-10-CM

## 2012-08-26 DIAGNOSIS — O30049 Twin pregnancy, dichorionic/diamniotic, unspecified trimester: Secondary | ICD-10-CM

## 2012-08-26 NOTE — Progress Notes (Signed)
P = 91 

## 2012-08-29 ENCOUNTER — Ambulatory Visit (INDEPENDENT_AMBULATORY_CARE_PROVIDER_SITE_OTHER): Payer: Self-pay | Admitting: Family Medicine

## 2012-08-29 VITALS — BP 116/83 | Temp 97.9°F | Wt 248.9 lb

## 2012-08-29 DIAGNOSIS — O24419 Gestational diabetes mellitus in pregnancy, unspecified control: Secondary | ICD-10-CM

## 2012-08-29 DIAGNOSIS — O99810 Abnormal glucose complicating pregnancy: Secondary | ICD-10-CM

## 2012-08-29 DIAGNOSIS — O30009 Twin pregnancy, unspecified number of placenta and unspecified number of amniotic sacs, unspecified trimester: Secondary | ICD-10-CM

## 2012-08-29 LAB — POCT URINALYSIS DIP (DEVICE)
Ketones, ur: NEGATIVE mg/dL
Protein, ur: 30 mg/dL — AB
Specific Gravity, Urine: >=1.03 (ref 1.005–1.030)
pH: 5.5 (ref 5.0–8.0)

## 2012-08-29 MED ORDER — INSULIN NPH (HUMAN) (ISOPHANE) 100 UNIT/ML ~~LOC~~ SUSP: 5.0000 [IU] | Freq: Two times a day (BID) | SUBCUTANEOUS | Status: DC

## 2012-08-29 NOTE — Patient Instructions (Signed)
Gestational Diabetes Mellitus Gestational diabetes mellitus (GDM) is diabetes that occurs only during pregnancy. This happens when the body cannot properly handle the glucose (sugar) that increases in the blood after eating. During pregnancy, insulin resistance (reduced sensitivity to insulin) occurs because of the release of hormones from the placenta. Usually, the pancreas of pregnant women produces enough insulin to overcome the resistance that occurs. However, in gestational diabetes, the insulin is there but it does not work effectively. If the resistance is severe enough that the pancreas does not produce enough insulin, extra glucose builds up in the blood.  WHO IS AT RISK FOR DEVELOPING GESTATIONAL DIABETES?  Women with a history of diabetes in the family.  Women over age 25.  Women who are overweight.  Women in certain ethnic groups (Hispanic, African American, Native American, Asian and Pacific Islander). WHAT CAN HAPPEN TO THE BABY? If the mother's blood glucose is too high while she is pregnant, the extra sugar will travel through the umbilical cord to the baby. Some of the problems the baby may have are:  Large Baby - If the baby receives too much sugar, the baby will gain more weight. This may cause the baby to be too large to be born normally (vaginally) and a Cesarean section (C-section) may be needed.  Low Blood Glucose (hypoglycemia)  The baby makes extra insulin, in response to the extra sugar its gets from its mother. When the baby is born and no longer needs this extra insulin, the baby's blood glucose level may drop.  Jaundice (yellow coloring of the skin and eyes)  This is fairly common in babies. It is caused from a build-up of the chemical called bilirubin. This is rarely serious, but is seen more often in babies whose mothers had gestational diabetes. RISKS TO THE MOTHER Women who have had gestational diabetes may be at higher risk for some problems,  including:  Preeclampsia or toxemia, which includes problems with high blood pressure. Blood pressure and protein levels in the urine must be checked frequently.  Infections.  Cesarean section (C-section) for delivery.  Developing Type 2 diabetes later in life. About 30-50% will develop diabetes later, especially if obese. DIAGNOSIS  The hormones that cause insulin resistance are highest at about 24-28 weeks of pregnancy. If symptoms are experienced, they are much like symptoms you would normally expect during pregnancy.  GDM is often diagnosed using a two part method: 1. After 24-28 weeks of pregnancy, the woman drinks a glucose solution and takes a blood test. If the glucose level is high, a second test will be given. 2. Oral Glucose Tolerance Test (OGTT) which is 3 hours long  After not eating overnight, the blood glucose is checked. The woman drinks a glucose solution, and hourly blood glucose tests are taken. If the woman has risk factors for GDM, the caregiver may test earlier than 24 weeks of pregnancy. TREATMENT  Treatment of GDM is directed at keeping the mother's blood glucose level normal, and may include:  Meal planning.  Taking insulin or other medicine to control your blood glucose level.  Exercise.  Keeping a daily record of the foods you eat.  Blood glucose monitoring and keeping a record of your blood glucose levels.  May monitor ketone levels in the urine, although this is no longer considered necessary in most pregnancies. HOME CARE INSTRUCTIONS  While you are pregnant:  Follow your caregiver's advice regarding your prenatal appointments, meal planning, exercise, medicines, vitamins, blood and other tests, and physical   activities.  Keep a record of your meals, blood glucose tests, and the amount of insulin you are taking (if any). Show this to your caregiver at every prenatal visit.  If you have GDM, you may have problems with hypoglycemia (low blood glucose).  You may suspect this if you become suddenly dizzy, feel shaky, and/or weak. If you think this is happening and you have a glucose meter, try to test your blood glucose level. Follow your caregiver's advice for when and how to treat your low blood glucose. Generally, the 15:15 rule is followed: Treat by consuming 15 grams of carbohydrates, wait 15 minutes, and recheck blood glucose. Examples of 15 grams of carbohydrates are:  1 cup skim or low-fat milk.   cup juice.  3-4 glucose tablets.  5-6 hard candies.  1 small box raisins.   cup regular soda pop.  Practice good hygiene, to avoid infections.  Do not smoke. SEEK MEDICAL CARE IF:   You develop abnormal vaginal discharge, with or without itching.  You become weak and tired more than expected.  You seem to sweat a lot.  You have a sudden increase in weight, 5 pounds or more in one week.  You are losing weight, 3 pounds or more in a week.  Your blood glucose level is high, and you need instructions on what to do about it. SEEK IMMEDIATE MEDICAL CARE IF:   You develop a severe headache.  You faint or pass out.  You develop nausea and vomiting.  You become disoriented or confused.  You have a convulsion.  You develop vision problems.  You develop stomach pain.  You develop vaginal bleeding.  You develop uterine contractions.  You have leaking or a gush of fluid from the vagina. AFTER YOU HAVE THE BABY:  Go to all of your follow-up appointments, and have blood tests as advised by your caregiver.  Maintain a healthy lifestyle, to prevent diabetes in the future. This includes:  Following a healthy meal plan.  Controlling your weight.  Getting enough exercise and proper rest.  Do not smoke.  Breastfeed your baby if you can. This will lower the chance of you and your baby developing diabetes later in life. For more information about diabetes, go to the American Diabetes Association at:  www.americandiabetesassociation.org. For more information about gestational diabetes, go to the American Congress of Obstetricians and Gynecologists at: www.acog.org. Document Released: 11/23/2000 Document Revised: 11/09/2011 Document Reviewed: 06/17/2009 ExitCare Patient Information 2013 [employer].  Breastfeeding Deciding to breastfeed is one of the best choices you can make for you and your baby. The information that follows gives a brief overview of the benefits of breastfeeding as well as common topics surrounding breastfeeding. BENEFITS OF BREASTFEEDING For the baby  The first milk (colostrum) helps the baby's digestive system function better.   There are antibodies in the mother's milk that help the baby fight off infections.   The baby has a lower incidence of asthma, allergies, and sudden infant death syndrome (SIDS).   The nutrients in breast milk are better for the baby than infant formulas, and breast milk helps the baby's brain grow better.   Babies who breastfeed have less gas, colic, and constipation.  For the mother  Breastfeeding helps develop a very special bond between the mother and her baby.   Breastfeeding is convenient, always available at the correct temperature, and costs nothing.   Breastfeeding burns calories in the mother and helps her lose weight that was gained during pregnancy.     Breastfeeding makes the uterus contract back down to normal size faster and slows bleeding following delivery.   Breastfeeding mothers have a lower risk of developing breast cancer.  BREASTFEEDING FREQUENCY  A healthy, full-term baby may breastfeed as often as every hour or space his or her feedings to every 3 hours.   Watch your baby for signs of hunger. Nurse your baby if he or she shows signs of hunger. How often you nurse will vary from baby to baby.   Nurse as often as the baby requests, or when you feel the need to reduce the fullness of your breasts.    Awaken the baby if it has been 3 4 hours since the last feeding.   Frequent feeding will help the mother make more milk and will help prevent problems, such as sore nipples and engorgement of the breasts.  BABY'S POSITION AT THE BREAST  Whether lying down or sitting, be sure that the baby's tummy is facing your tummy.   Support the breast with 4 fingers underneath the breast and the thumb above. Make sure your fingers are well away from the nipple and baby's mouth.   Stroke the baby's lips gently with your finger or nipple.   When the baby's mouth is open wide enough, place all of your nipple and as much of the areola as possible into your baby's mouth.   Pull the baby in close so the tip of the nose and the baby's cheeks touch the breast during the feeding.  FEEDINGS AND SUCTION  The length of each feeding varies from baby to baby and from feeding to feeding.   The baby must suck about 2 3 minutes for your milk to get to him or her. This is called a "let down." For this reason, allow the baby to feed on each breast as long as he or she wants. Your baby will end the feeding when he or she has received the right balance of nutrients.   To break the suction, put your finger into the corner of the baby's mouth and slide it between his or her gums before removing your breast from his or her mouth. This will help prevent sore nipples.  HOW TO TELL WHETHER YOUR BABY IS GETTING ENOUGH BREAST MILK. Wondering whether or not your baby is getting enough milk is a common concern among mothers. You can be assured that your baby is getting enough milk if:   Your baby is actively sucking and you hear swallowing.   Your baby seems relaxed and satisfied after a feeding.   Your baby nurses at least 8 12 times in a 24 hour time period. Nurse your baby until he or she unlatches or falls asleep at the first breast (at least 10 20 minutes), then offer the second side.   Your baby is wetting  5 6 disposable diapers (6 8 cloth diapers) in a 24 hour period by 5 6 days of age.   Your baby is having at least 3 4 stools every 24 hours for the first 6 weeks. The stool should be soft and yellow.   Your baby should gain 4 7 ounces per week after he or she is 4 days old.   Your breasts feel softer after nursing.  REDUCING BREAST ENGORGEMENT  In the first week after your baby is born, you may experience signs of breast engorgement. When breasts are engorged, they feel heavy, warm, full, and may be tender to the touch. You can reduce   engorgement if you:   Nurse frequently, every 2 3 hours. Mothers who breastfeed early and often have fewer problems with engorgement.   Place light ice packs on your breasts for 10 20 minutes between feedings. This reduces swelling. Wrap the ice packs in a lightweight towel to protect your skin. Bags of frozen vegetables work well for this purpose.   Take a warm shower or apply warm, moist heat to your breast for 5 10 minutes just before each feeding. This increases circulation and helps the milk flow.   Gently massage your breast before and during the feeding. Using your finger tips, massage from the chest wall towards your nipple in a circular motion.   Make sure that the baby empties at least one breast at every feeding before switching sides.   Use a breast pump to empty the breasts if your baby is sleepy or not nursing well. You may also want to pump if you are returning to work oryou feel you are getting engorged.   Avoid bottle feeds, pacifiers, or supplemental feedings of water or juice in place of breastfeeding. Breast milk is all the food your baby needs. It is not necessary for your baby to have water or formula. In fact, to help your breasts make more milk, it is best not to give your baby supplemental feedings during the early weeks.   Be sure the baby is latched on and positioned properly while breastfeeding.   Wear a supportive  bra, avoiding underwire styles.   Eat a balanced diet with enough fluids.   Rest often, relax, and take your prenatal vitamins to prevent fatigue, stress, and anemia.  If you follow these suggestions, your engorgement should improve in 24 48 hours. If you are still experiencing difficulty, call your lactation consultant or caregiver.  CARING FOR YOURSELF Take care of your breasts  Bathe or shower daily.   Avoid using soap on your nipples.   Start feedings on your left breast at one feeding and on your right breast at the next feeding.   You will notice an increase in your milk supply 2 5 days after delivery. You may feel some discomfort from engorgement, which makes your breasts very firm and often tender. Engorgement "peaks" out within 24 48 hours. In the meantime, apply warm moist towels to your breasts for 5 10 minutes before feeding. Gentle massage and expression of some milk before feeding will soften your breasts, making it easier for your baby to latch on.   Wear a well-fitting nursing bra, and air dry your nipples for a 3 4minutes after each feeding.   Only use cotton bra pads.   Only use pure lanolin on your nipples after nursing. You do not need to wash it off before feeding the baby again. Another option is to express a few drops of breast milk and gently massage it into your nipples.  Take care of yourself  Eat well-balanced meals and nutritious snacks.   Drinking milk, fruit juice, and water to satisfy your thirst (about 8 glasses a day).   Get plenty of rest.  Avoid foods that you notice affect the baby in a bad way.  SEEK MEDICAL CARE IF:   You have difficulty with breastfeeding and need help.   You have a hard, red, sore area on your breast that is accompanied by a fever.   Your baby is too sleepy to eat well or is having trouble sleeping.   Your baby is wetting less than   6 diapers a day, by 5 days of age.   Your baby's skin or white part of  his or her eyes is more yellow than it was in the hospital.   You feel depressed.  Document Released: 08/17/2005 Document Revised: 02/16/2012 Document Reviewed: 11/15/2011 ExitCare Patient Information 2013 [employer].  

## 2012-08-29 NOTE — Progress Notes (Signed)
NST 12-20 reactive 

## 2012-08-29 NOTE — Progress Notes (Signed)
Pt continues to have extreme nasal congestion and mucous drainage despite OTC meds (sudafed, claritin, mucinex, saline nasal spray)

## 2012-08-29 NOTE — Progress Notes (Signed)
NST reviewed and reactive x 2 Irregular contractions. FBS 86-99 2 hr pp 91-130-most are out of range will add NPH in am at 5 units.

## 2012-08-29 NOTE — Progress Notes (Signed)
Pulse- 101  Pain/pressure- lower abd

## 2012-08-31 NOTE — L&D Delivery Note (Signed)
Delivery Note   Lisa [name], Lisa [name] [[id number]]  At 10:08 PM a viable female was delivered via Vaginal, Spontaneous Delivery (Presentation: ; Occiput Anterior).  APGAR: 9, 9; weight 5 lb 10.3 oz (2560 g).   Placenta status: Intact, Spontaneous.  Cord:  with the following complications: None.  Anesthesia: Epidural  Episiotomy: none Lacerations: small 1st degree perineal and periclitoral Suture Repair: 3.0 vicryl Est. Blood Loss (mL):     Lisa [name], Lisa [name] [[id number]]  At 10:18 PM a viable female was delivered via Vaginal, Spontaneous Delivery (Presentation: ;  ).  APGAR: 9, 9; weight 4 lb 15 oz (2240 g).   Placenta status: Intact, Spontaneous.  Cord:  with the following complications: .  Anesthesia:  Epidural Episiotomy: none Lacerations: small 1st degree perineal  and periclitoral Suture Repair: 3.0 vicryl Est. Blood Loss (mL):    Mom to postpartum.   Baby A to nursery-stable.   Baby B to nursery-stable.  Tawnya Crook 09/12/2012, 10:50 PM

## 2012-09-02 ENCOUNTER — Ambulatory Visit (INDEPENDENT_AMBULATORY_CARE_PROVIDER_SITE_OTHER): Payer: Self-pay | Admitting: *Deleted

## 2012-09-02 VITALS — BP 122/68 | Wt 250.5 lb

## 2012-09-02 DIAGNOSIS — O30049 Twin pregnancy, dichorionic/diamniotic, unspecified trimester: Secondary | ICD-10-CM

## 2012-09-02 DIAGNOSIS — O24419 Gestational diabetes mellitus in pregnancy, unspecified control: Secondary | ICD-10-CM

## 2012-09-02 DIAGNOSIS — O99810 Abnormal glucose complicating pregnancy: Secondary | ICD-10-CM

## 2012-09-02 MED ORDER — PRENATAL 27-0.8 MG PO TABS: 1.0000 | ORAL_TABLET | Freq: Every day | ORAL | Status: DC

## 2012-09-02 NOTE — Progress Notes (Signed)
NST performed today was reviewed and was found to be reactive for both twins.  Continue recommended antenatal testing and prenatal care.

## 2012-09-02 NOTE — Progress Notes (Signed)
P = 98   Persistent URI sx- nasal congestion, cough.  Interpreter present during visit.

## 2012-09-05 ENCOUNTER — Ambulatory Visit (INDEPENDENT_AMBULATORY_CARE_PROVIDER_SITE_OTHER): Payer: Self-pay | Admitting: Obstetrics and Gynecology

## 2012-09-05 ENCOUNTER — Encounter: Payer: Self-pay | Admitting: Obstetrics and Gynecology

## 2012-09-05 VITALS — BP 126/81 | Temp 96.9°F | Wt 252.7 lb

## 2012-09-05 DIAGNOSIS — O99810 Abnormal glucose complicating pregnancy: Secondary | ICD-10-CM

## 2012-09-05 DIAGNOSIS — O30049 Twin pregnancy, dichorionic/diamniotic, unspecified trimester: Secondary | ICD-10-CM

## 2012-09-05 DIAGNOSIS — O26879 Cervical shortening, unspecified trimester: Secondary | ICD-10-CM

## 2012-09-05 DIAGNOSIS — O24419 Gestational diabetes mellitus in pregnancy, unspecified control: Secondary | ICD-10-CM

## 2012-09-05 LAB — POCT URINALYSIS DIP (DEVICE)
Ketones, ur: NEGATIVE mg/dL
Protein, ur: >=300 mg/dL — AB
Specific Gravity, Urine: >=1.03 (ref 1.005–1.030)
pH: 7 (ref 5.0–8.0)

## 2012-09-05 LAB — COMPREHENSIVE METABOLIC PANEL
AST: 22 U/L (ref 0–37)
Albumin: 3.3 g/dL — ABNORMAL LOW (ref 3.5–5.2)
BUN: 10 mg/dL (ref 6–23)
Calcium: 9.1 mg/dL (ref 8.4–10.5)
Chloride: 105 mEq/L (ref 96–112)
Creat: 0.52 mg/dL (ref 0.50–1.10)
Glucose, Bld: 80 mg/dL (ref 70–99)
Potassium: 4.1 mEq/L (ref 3.5–5.3)

## 2012-09-05 NOTE — Addendum Note (Signed)
Addended by: Soyla Murphy T on: 09/05/2012 12:25 PM   Modules accepted: Orders

## 2012-09-05 NOTE — Progress Notes (Signed)
Pulse: 86

## 2012-09-05 NOTE — Progress Notes (Signed)
U/S scheduled 09/09/12 at 945 am.

## 2012-09-05 NOTE — Progress Notes (Signed)
NST reviewed and reactive x 2. Patient with large amount of protein in urine without headaches, visual disturbances, RUQ/epigastric pain, nausea or emesis. Will obtain 24 hr urine collection and blood work. Cultures done today. Will schedule f/u growth ultrasound CBG fasting 75-88 2hr pp majority within range.

## 2012-09-06 LAB — CBC
HCT: 35.8 % — ABNORMAL LOW (ref 36.0–46.0)
Hemoglobin: 12.1 g/dL (ref 12.0–15.0)
MCHC: 33.8 g/dL (ref 30.0–36.0)
MCV: 77.3 fL — ABNORMAL LOW (ref 78.0–100.0)
RDW: 16.5 % — ABNORMAL HIGH (ref 11.5–15.5)

## 2012-09-07 NOTE — Addendum Note (Signed)
Addended by: Franchot Mimes on: 09/07/2012 12:37 PM   Modules accepted: Orders

## 2012-09-08 ENCOUNTER — Encounter: Payer: Self-pay | Admitting: Obstetrics and Gynecology

## 2012-09-09 ENCOUNTER — Ambulatory Visit (INDEPENDENT_AMBULATORY_CARE_PROVIDER_SITE_OTHER): Payer: Self-pay | Admitting: Obstetrics & Gynecology

## 2012-09-09 ENCOUNTER — Ambulatory Visit (HOSPITAL_COMMUNITY)
Admission: RE | Admit: 2012-09-09 | Discharge: 2012-09-09 | Disposition: A | Discharge status: Home / Self Care | Payer: Self-pay | Source: Ambulatory Visit | Attending: Obstetrics and Gynecology | Admitting: Obstetrics and Gynecology

## 2012-09-09 VITALS — BP 126/73 | Wt 253.5 lb

## 2012-09-09 DIAGNOSIS — B373 Candidiasis of vulva and vagina: Secondary | ICD-10-CM

## 2012-09-09 DIAGNOSIS — O99810 Abnormal glucose complicating pregnancy: Secondary | ICD-10-CM | POA: Insufficient documentation

## 2012-09-09 DIAGNOSIS — N760 Acute vaginitis: Secondary | ICD-10-CM

## 2012-09-09 DIAGNOSIS — N898 Other specified noninflammatory disorders of vagina: Secondary | ICD-10-CM

## 2012-09-09 DIAGNOSIS — O30049 Twin pregnancy, dichorionic/diamniotic, unspecified trimester: Secondary | ICD-10-CM

## 2012-09-09 DIAGNOSIS — O9989 Other specified diseases and conditions complicating pregnancy, childbirth and the puerperium: Secondary | ICD-10-CM

## 2012-09-09 DIAGNOSIS — O24419 Gestational diabetes mellitus in pregnancy, unspecified control: Secondary | ICD-10-CM

## 2012-09-09 DIAGNOSIS — O30009 Twin pregnancy, unspecified number of placenta and unspecified number of amniotic sacs, unspecified trimester: Secondary | ICD-10-CM | POA: Insufficient documentation

## 2012-09-09 DIAGNOSIS — O26899 Other specified pregnancy related conditions, unspecified trimester: Secondary | ICD-10-CM

## 2012-09-09 DIAGNOSIS — A499 Bacterial infection, unspecified: Secondary | ICD-10-CM

## 2012-09-09 DIAGNOSIS — O26879 Cervical shortening, unspecified trimester: Secondary | ICD-10-CM

## 2012-09-09 LAB — OB RESULTS CONSOLE GBS: GBS: NEGATIVE

## 2012-09-09 NOTE — Progress Notes (Signed)
P = 78  Pt had [place] growth today.  Pt reports green Vag d/c and itching x3 days.  SSE per Dr. Macon Large, GC/Chlamydia, GBS & wet prep sent.

## 2012-09-09 NOTE — Progress Notes (Addendum)
NST performed today was reviewed and was found to be reactive x 2.  Continue recommended antenatal testing and prenatal care. 09/09/12 A EFW 2418g/20%, cephalic; B EFW 2443g/22%; normal AFV x 2. Patient reported seeing green discharge and having some itching. On exam, no green discharge seen, just yellow-white discharge. GC/Chlam, wet prep and GBS cultures done.  Will follow up results and manage accordingly. No other complaints or concerns.  Fetal movement and labor precautions reviewed.    Lab Addendum on 09/10/12: Wet prep showed moderate clue cells and yeast. Prescribed Metronidazole and Diflucan. Patient called to inform her of results and told to pick up prescription. She was told to come back for any worsening symptoms. No other complaints or concerns.  Fetal movement and labor precautions reviewed.

## 2012-09-09 NOTE — Patient Instructions (Signed)
Return to clinic for any obstetric concerns or go to MAU for evaluation  

## 2012-09-10 LAB — WET PREP, GENITAL: Trich, Wet Prep: NONE SEEN

## 2012-09-10 MED ORDER — METRONIDAZOLE 500 MG PO TABS: 500.0000 mg | ORAL_TABLET | Freq: Two times a day (BID) | ORAL | Status: AC

## 2012-09-10 MED ORDER — FLUCONAZOLE 150 MG PO TABS: 150.0000 mg | ORAL_TABLET | Freq: Once | ORAL | Status: DC

## 2012-09-10 NOTE — Addendum Note (Signed)
Addended by: Jaynie Collins A on: 09/10/2012 11:13 AM   Modules accepted: Orders

## 2012-09-11 LAB — CREATININE CLEARANCE, URINE, 24 HOUR
Creatinine, Urine: 62.2 mg/dL
Creatinine: 0.52 mg/dL (ref 0.50–1.10)

## 2012-09-11 LAB — PROTEIN, URINE, 24 HOUR: Protein, 24H Urine: 1479 mg/d — ABNORMAL HIGH (ref 50–100)

## 2012-09-12 ENCOUNTER — Inpatient Hospital Stay (HOSPITAL_COMMUNITY): Payer: Medicaid Other | Admitting: Anesthesiology

## 2012-09-12 ENCOUNTER — Encounter (HOSPITAL_COMMUNITY): Payer: Self-pay | Admitting: Anesthesiology

## 2012-09-12 ENCOUNTER — Inpatient Hospital Stay (HOSPITAL_COMMUNITY)
Admission: AD | Admit: 2012-09-12 | Discharge: 2012-09-14 | DRG: 775 | Disposition: A | Discharge status: Home / Self Care | Payer: Medicaid Other | Source: Ambulatory Visit | Attending: Obstetrics & Gynecology | Admitting: Obstetrics & Gynecology

## 2012-09-12 ENCOUNTER — Encounter (HOSPITAL_COMMUNITY): Payer: Self-pay | Admitting: *Deleted

## 2012-09-12 ENCOUNTER — Ambulatory Visit (INDEPENDENT_AMBULATORY_CARE_PROVIDER_SITE_OTHER): Payer: Self-pay | Admitting: Obstetrics & Gynecology

## 2012-09-12 VITALS — BP 132/75 | Wt 252.9 lb

## 2012-09-12 DIAGNOSIS — O26879 Cervical shortening, unspecified trimester: Secondary | ICD-10-CM | POA: Diagnosis present

## 2012-09-12 DIAGNOSIS — O30009 Twin pregnancy, unspecified number of placenta and unspecified number of amniotic sacs, unspecified trimester: Secondary | ICD-10-CM

## 2012-09-12 DIAGNOSIS — O24419 Gestational diabetes mellitus in pregnancy, unspecified control: Secondary | ICD-10-CM

## 2012-09-12 DIAGNOSIS — O99810 Abnormal glucose complicating pregnancy: Secondary | ICD-10-CM

## 2012-09-12 DIAGNOSIS — O99814 Abnormal glucose complicating childbirth: Secondary | ICD-10-CM | POA: Diagnosis present

## 2012-09-12 DIAGNOSIS — O30049 Twin pregnancy, dichorionic/diamniotic, unspecified trimester: Secondary | ICD-10-CM

## 2012-09-12 DIAGNOSIS — F1920 Other psychoactive substance dependence, uncomplicated: Secondary | ICD-10-CM

## 2012-09-12 DIAGNOSIS — O99320 Drug use complicating pregnancy, unspecified trimester: Secondary | ICD-10-CM

## 2012-09-12 LAB — POCT URINALYSIS DIP (DEVICE)
Glucose, UA: NEGATIVE mg/dL
Leukocytes, UA: NEGATIVE
Nitrite: NEGATIVE
Urobilinogen, UA: 0.2 mg/dL (ref 0.0–1.0)

## 2012-09-12 LAB — CBC
MCHC: 32.9 g/dL (ref 30.0–36.0)
Platelets: 109 10*3/uL — ABNORMAL LOW (ref 150–400)
RDW: 15.7 % — ABNORMAL HIGH (ref 11.5–15.5)
WBC: 4.9 10*3/uL (ref 4.0–10.5)

## 2012-09-12 LAB — GLUCOSE, CAPILLARY
Glucose-Capillary: 113 mg/dL — ABNORMAL HIGH (ref 70–99)
Glucose-Capillary: 82 mg/dL (ref 70–99)
Glucose-Capillary: 76 mg/dL (ref 70–99)
Glucose-Capillary: 67 mg/dL — ABNORMAL LOW (ref 70–99)
Glucose-Capillary: 73 mg/dL (ref 70–99)

## 2012-09-12 LAB — RPR: RPR Ser Ql: NONREACTIVE

## 2012-09-12 MED ORDER — PHENYLEPHRINE 40 MCG/ML (10ML) SYRINGE FOR IV PUSH (FOR BLOOD PRESSURE SUPPORT)
80.0000 ug | PREFILLED_SYRINGE | INTRAVENOUS | Status: DC | PRN
  Filled 2012-09-12: qty 5

## 2012-09-12 MED ORDER — FLEET ENEMA 7-19 GM/118ML RE ENEM: 1.0000 | ENEMA | RECTAL | Status: DC | PRN

## 2012-09-12 MED ORDER — SODIUM CHLORIDE 0.9 % IV SOLN: 250.0000 mL | INTRAVENOUS | Status: DC | PRN

## 2012-09-12 MED ORDER — IBUPROFEN 600 MG PO TABS
600.0000 mg | ORAL_TABLET | Freq: Four times a day (QID) | ORAL | Status: DC
  Administered 2012-09-13 – 2012-09-14 (×6): 600 mg via ORAL
  Filled 2012-09-12 (×6): qty 1

## 2012-09-12 MED ORDER — OXYTOCIN BOLUS FROM INFUSION
500.0000 mL | INTRAVENOUS | Status: DC
  Administered 2012-09-12: 500 mL via INTRAVENOUS

## 2012-09-12 MED ORDER — ONDANSETRON HCL 4 MG/2ML IJ SOLN: 4.0000 mg | INTRAMUSCULAR | Status: DC | PRN

## 2012-09-12 MED ORDER — OXYTOCIN 40 UNITS IN LACTATED RINGERS INFUSION - SIMPLE MED
62.5000 mL/h | INTRAVENOUS | Status: DC
  Administered 2012-09-12: 62.5 mL/h via INTRAVENOUS
  Filled 2012-09-12: qty 1000

## 2012-09-12 MED ORDER — TETANUS-DIPHTH-ACELL PERTUSSIS 5-2.5-18.5 LF-MCG/0.5 IM SUSP
0.5000 mL | Freq: Once | INTRAMUSCULAR | Status: DC
  Filled 2012-09-12: qty 0.5

## 2012-09-12 MED ORDER — BENZOCAINE-MENTHOL 20-0.5 % EX AERO
1.0000 "application " | INHALATION_SPRAY | CUTANEOUS | Status: DC | PRN
  Administered 2012-09-13: 1 via TOPICAL
  Filled 2012-09-12 (×2): qty 56

## 2012-09-12 MED ORDER — WITCH HAZEL-GLYCERIN EX PADS: 1.0000 "application " | MEDICATED_PAD | CUTANEOUS | Status: DC | PRN

## 2012-09-12 MED ORDER — ONDANSETRON HCL 4 MG PO TABS: 4.0000 mg | ORAL_TABLET | ORAL | Status: DC | PRN

## 2012-09-12 MED ORDER — LACTATED RINGERS IV SOLN
500.0000 mL | INTRAVENOUS | Status: DC | PRN
  Administered 2012-09-12: 500 mL via INTRAVENOUS

## 2012-09-12 MED ORDER — EPHEDRINE 5 MG/ML INJ: 10.0000 mg | INTRAVENOUS | Status: DC | PRN

## 2012-09-12 MED ORDER — LIDOCAINE HCL (PF) 1 % IJ SOLN
INTRAMUSCULAR | Status: DC | PRN
  Administered 2012-09-12 (×2): 5 mL

## 2012-09-12 MED ORDER — SIMETHICONE 80 MG PO CHEW: 80.0000 mg | CHEWABLE_TABLET | ORAL | Status: DC | PRN

## 2012-09-12 MED ORDER — FENTANYL 2.5 MCG/ML BUPIVACAINE 1/10 % EPIDURAL INFUSION (WH - ANES)
14.0000 mL/h | INTRAMUSCULAR | Status: DC
  Administered 2012-09-12 (×2): 14 mL/h via EPIDURAL
  Filled 2012-09-12 (×2): qty 125

## 2012-09-12 MED ORDER — SODIUM CHLORIDE 0.45 % IV SOLN: INTRAVENOUS | Status: DC

## 2012-09-12 MED ORDER — MEASLES, MUMPS & RUBELLA VAC ~~LOC~~ INJ
0.5000 mL | INJECTION | Freq: Once | SUBCUTANEOUS | Status: DC
  Filled 2012-09-12: qty 0.5

## 2012-09-12 MED ORDER — LACTATED RINGERS IV SOLN
INTRAVENOUS | Status: DC
  Administered 2012-09-12 (×2): via INTRAVENOUS

## 2012-09-12 MED ORDER — ZOLPIDEM TARTRATE 5 MG PO TABS: 5.0000 mg | ORAL_TABLET | Freq: Every evening | ORAL | Status: DC | PRN

## 2012-09-12 MED ORDER — SODIUM CHLORIDE 0.9 % IV SOLN
2.0000 g | Freq: Four times a day (QID) | INTRAVENOUS | Status: DC
  Filled 2012-09-12: qty 2000

## 2012-09-12 MED ORDER — PHENYLEPHRINE 40 MCG/ML (10ML) SYRINGE FOR IV PUSH (FOR BLOOD PRESSURE SUPPORT): 80.0000 ug | PREFILLED_SYRINGE | INTRAVENOUS | Status: DC | PRN

## 2012-09-12 MED ORDER — ACETAMINOPHEN 325 MG PO TABS
650.0000 mg | ORAL_TABLET | ORAL | Status: DC | PRN
  Administered 2012-09-12: 650 mg via ORAL
  Filled 2012-09-12: qty 2

## 2012-09-12 MED ORDER — LIDOCAINE HCL (PF) 1 % IJ SOLN
30.0000 mL | INTRAMUSCULAR | Status: DC | PRN
  Administered 2012-09-12: 30 mL via SUBCUTANEOUS
  Filled 2012-09-12: qty 30

## 2012-09-12 MED ORDER — CITRIC ACID-SODIUM CITRATE 334-500 MG/5ML PO SOLN: 30.0000 mL | ORAL | Status: DC | PRN

## 2012-09-12 MED ORDER — EPHEDRINE 5 MG/ML INJ
10.0000 mg | INTRAVENOUS | Status: DC | PRN
  Filled 2012-09-12: qty 4

## 2012-09-12 MED ORDER — OXYTOCIN 40 UNITS IN LACTATED RINGERS INFUSION - SIMPLE MED
1.0000 m[IU]/min | INTRAVENOUS | Status: DC
  Administered 2012-09-12: 14 m[IU]/min via INTRAVENOUS
  Administered 2012-09-12: 2 m[IU]/min via INTRAVENOUS

## 2012-09-12 MED ORDER — OXYCODONE-ACETAMINOPHEN 5-325 MG PO TABS
1.0000 | ORAL_TABLET | ORAL | Status: DC | PRN
Start: 2012-09-12 — End: 2012-09-12

## 2012-09-12 MED ORDER — SODIUM CHLORIDE 0.9 % IJ SOLN: 3.0000 mL | INTRAMUSCULAR | Status: DC | PRN

## 2012-09-12 MED ORDER — OXYCODONE-ACETAMINOPHEN 5-325 MG PO TABS
1.0000 | ORAL_TABLET | ORAL | Status: DC | PRN
  Administered 2012-09-13: 2 via ORAL
  Administered 2012-09-14: 1 via ORAL
  Filled 2012-09-12: qty 1
  Filled 2012-09-12: qty 2

## 2012-09-12 MED ORDER — TERBUTALINE SULFATE 1 MG/ML IJ SOLN: 0.2500 mg | Freq: Once | INTRAMUSCULAR | Status: DC | PRN

## 2012-09-12 MED ORDER — SODIUM CHLORIDE 0.9 % IJ SOLN
3.0000 mL | Freq: Two times a day (BID) | INTRAMUSCULAR | Status: DC
  Administered 2012-09-13: 3 mL via INTRAVENOUS

## 2012-09-12 MED ORDER — MISOPROSTOL 200 MCG PO TABS
ORAL_TABLET | ORAL | Status: AC
  Administered 2012-09-12: 1000 ug
  Filled 2012-09-12: qty 5

## 2012-09-12 MED ORDER — SENNOSIDES-DOCUSATE SODIUM 8.6-50 MG PO TABS
2.0000 | ORAL_TABLET | Freq: Every day | ORAL | Status: DC
  Administered 2012-09-13: 2 via ORAL

## 2012-09-12 MED ORDER — DIPHENHYDRAMINE HCL 50 MG/ML IJ SOLN: 12.5000 mg | INTRAMUSCULAR | Status: DC | PRN

## 2012-09-12 MED ORDER — IBUPROFEN 600 MG PO TABS: 600.0000 mg | ORAL_TABLET | Freq: Four times a day (QID) | ORAL | Status: DC | PRN

## 2012-09-12 MED ORDER — DIBUCAINE 1 % RE OINT
1.0000 "application " | TOPICAL_OINTMENT | RECTAL | Status: DC | PRN
  Filled 2012-09-12: qty 28

## 2012-09-12 MED ORDER — LANOLIN HYDROUS EX OINT: TOPICAL_OINTMENT | CUTANEOUS | Status: DC | PRN

## 2012-09-12 MED ORDER — DIPHENHYDRAMINE HCL 25 MG PO CAPS: 25.0000 mg | ORAL_CAPSULE | Freq: Four times a day (QID) | ORAL | Status: DC | PRN

## 2012-09-12 MED ORDER — ONDANSETRON HCL 4 MG/2ML IJ SOLN: 4.0000 mg | Freq: Four times a day (QID) | INTRAMUSCULAR | Status: DC | PRN

## 2012-09-12 MED ORDER — PRENATAL MULTIVITAMIN CH
1.0000 | ORAL_TABLET | Freq: Every day | ORAL | Status: DC
  Administered 2012-09-13 – 2012-09-14 (×2): 1 via ORAL
  Filled 2012-09-12 (×2): qty 1

## 2012-09-12 MED ORDER — LACTATED RINGERS IV SOLN
500.0000 mL | Freq: Once | INTRAVENOUS | Status: AC
  Administered 2012-09-12: 12:00:00 via INTRAVENOUS

## 2012-09-12 NOTE — Progress Notes (Signed)
FHR stable on both babies Feeling pressure  UCs every 2-3 minutes  Cervix 9cm per RN  Anticipate SVD soon.

## 2012-09-12 NOTE — Progress Notes (Signed)
P = 75    Pt wants to deliver!!  Pt states she felt only 1 FM yesterday- none so far today.  Pt is not aware of FM during NST.  Pt has not started diflucan or metronidazole- will begin today.  Pt was taking Delsym for cough which was helping, cough stopped and now has returned- advised to take Delsym again.

## 2012-09-12 NOTE — H&P (Signed)
Obstetric History and Physical  Lisa [name] is a 32 y.o. G1P0 with IUP with di/di twins at [redacted]w[redacted]d presenting for SROM, early labor.  She was being seen in the [facility] today and was noted to be grossly ruptured. Twins are vertex/vertex, patient desires vaginal delivery. Followed at [facility] for A2GDM.  No vaginal bleeding with active fetal movement.    Prenatal Course Source of Care: [facility] with onset of care at 28 weeks, transfer of care from [place] Pregnancy complications or risks: Patient Active Problem List  Diagnosis  . Twin gestation, dichorionic diamniotic  . Gestational diabetes mellitus, antepartum  . Short cervix, antepartum  . Supervision of high-risk pregnancy   She plans to breastfeed She desires abstinence for postpartum contraception, husband is in [place].   Prenatal labs and studies: ABO, Rh: --/--/A POS (01/13 1030) Antibody: NEG (01/13 1030) Rubella: 314.5 (11/18 1235) RPR: NON REACTIVE (01/13 1030)  HBsAg: NEGATIVE (11/18 1235)  HIV: Non-reactive (08/13 0000)  [id number] (01/10 0000) Genetic screening too late Anatomy US normal x 2  Prenatal Transfer Tool  Maternal Diabetes: Yes:  Diabetes Type:  Insulin/Medication controlled Genetic Screening: Declined Maternal Ultrasounds/Referrals: Normal Fetal Ultrasounds or other Referrals:  None Maternal Substance Abuse:  No Significant Maternal Medications:  Meds include: Other: Glyburide then insulin Significant Maternal Lab Results: Lab values include: Group B Strep negative  Past Medical History  Diagnosis Date  . Diabetes mellitus without complication     type 1    History reviewed. No pertinent past surgical history.  OB History    Grav Para Term Preterm Abortions TAB SAB Ect Mult Living   1 0 0 0 0 0 0 0 0 0      Patient denies any recent cervical dysplasia or STIs.    History   Social History  . Marital Status: Married    Spouse Name: N/A    Number of Children: N/A  . Years of Education: N/A   Social  History Main Topics  . Smoking status: Never Smoker   . Smokeless tobacco: None  . Alcohol Use: No  . Drug Use: No  . Sexually Active: Not Currently    Birth Control/ Protection: None   Other Topics Concern  . None   Social History Narrative  . None    Family History  Problem Relation Age of Onset  . Other Neg Hx   . Diabetes Maternal Grandmother   . Diabetes Maternal Grandfather     Prescriptions prior to admission  Medication Sig Dispense Refill  . ferrous gluconate (FERGON) 246 (28 FE) MG tablet Take 100 mg by mouth daily with breakfast.      . guaiFENesin (MUCINEX) 600 MG 12 hr tablet Take 1,200 mg by mouth 2 (two) times daily.      . insulin lispro (HUMALOG) 100 UNIT/ML injection Inject 6 Units into the skin 3 (three) times daily before meals.      . insulin NPH (HUMULIN N,NOVOLIN N) 100 UNIT/ML injection Inject 5 Units into the skin 2 (two) times daily.      Marland Kitchen loratadine (CLARITIN) 10 MG tablet Take 10 mg by mouth daily as needed. For allergies      . Prenatal Vit-Fe Fumarate-FA (MULTIVITAMIN-PRENATAL) 27-0.8 MG TABS Take 1 tablet by mouth daily.  30 each  3  . pseudoephedrine (SUDAFED) 30 MG tablet Take 30 mg by mouth every 4 (four) hours as needed. For cold      . [DISCONTINUED] insulin NPH (HUMULIN N,NOVOLIN N) 100 UNIT/ML injection Inject  5 Units into the skin 2 (two) times daily. q am and q hs  1 vial  2  . metroNIDAZOLE (FLAGYL) 500 MG tablet Take 1 tablet (500 mg total) by mouth 2 (two) times daily.  14 tablet  0    No Known Allergies  Review of Systems: Negative except for what is mentioned in HPI.  Physical Exam: BP 131/69  Pulse 85  Temp 97.8 F (36.6 C) Resp 20  Ht 5\' 4"  (1.626 m)  Wt 252 lb (114.306 kg)  BMI 43.26 kg/m2  SpO2 100% GENERAL: Well-developed, well-nourished female in no acute distress.  LUNGS: Clear to auscultation bilaterally.  HEART: Regular rate and rhythm. ABDOMEN: Soft, nontender, nondistended, gravid. EXTREMITIES: Nontender, no  edema, 2+ distal pulses. Cervical Exam: Dilatation 4.5 cm   Effacement 100%   Station 0   Presentation: cephalic x 2 FHT:  A Baseline rate 130 bpm   Variability moderate  Accelerations present   Decelerations none B Baseline rate 150 bpm   Variability moderate  Accelerations present   Decelerations none Contractions: Every 3-5 mins   Pertinent Labs/Studies:   Results for orders placed during the hospital encounter of 09/12/12 (from the past 24 hour(s))  CBC     Status: Abnormal   Collection Time   09/12/12 10:30 AM      Component Value Range   WBC 4.9  4.0 - 10.5 K/uL   RBC 4.45  3.87 - 5.11 MIL/uL   Hemoglobin 11.6 (*) 12.0 - 15.0 g/dL   HCT 16.1 (*) 09.6 - 04.5 %   MCV 79.3  78.0 - 100.0 fL   MCH 26.1  26.0 - 34.0 pg   MCHC 32.9  30.0 - 36.0 g/dL   RDW 40.9 (*) 81.1 - 91.4 %   Platelets 109 (*) 150 - 400 K/uL  RPR     Status: Normal   Collection Time   09/12/12 10:30 AM      Component Value Range   RPR NON REACTIVE  NON REACTIVE  TYPE AND SCREEN     Status: Normal (Preliminary result)   Collection Time   09/12/12 10:30 AM      Component Value Range   ABO/RH(D) A POS     Antibody Screen NEG     Sample Expiration 09/15/2012     Unit Number [id number]     Blood Component Type RED CELLS,LR     Unit division 00     Status of Unit ALLOCATED     Transfusion Status OK TO TRANSFUSE     Crossmatch Result Compatible     Unit Number [id number]     Blood Component Type RED CELLS,LR     Unit division 00     Status of Unit ALLOCATED     Transfusion Status OK TO TRANSFUSE     Crossmatch Result Compatible      Assessment : Lisa [name] is a 32 y.o. G1P0 at [redacted]w[redacted]d with di/di twins being admitted for labor.  Plan: Labor: Expectant management.  Induction as needed, per protocol A2GDM: Will monitor CBGs every two hours and treat accordingly FWB: Reassuring fetal heart tracing x 2.  GBS negative Delivery plan: Hopeful for vaginal delivery  Jaynie Collins, MD, FACOG Attending  Obstetrician & Gynecologist Faculty Practice, [facility] [place]

## 2012-09-12 NOTE — Anesthesia Preprocedure Evaluation (Addendum)
Anesthesia Evaluation  Patient identified by MRN, date of birth, ID band Patient awake    Reviewed: Allergy & Precautions, H&P , Patient's Chart, lab work & pertinent test results  Airway Mallampati: III TM Distance: >3 FB Neck ROM: full    Dental No notable dental hx.    Pulmonary neg pulmonary ROS,  breath sounds clear to auscultation  Pulmonary exam normal       Cardiovascular negative cardio ROS  Rhythm:regular Rate:Normal     Neuro/Psych negative neurological ROS  negative psych ROS   GI/Hepatic negative GI ROS, Neg liver ROS,   Endo/Other  negative endocrine ROSdiabetesMorbid obesity  Renal/GU negative Renal ROS     Musculoskeletal   Abdominal   Peds  Hematology negative hematology ROS (+)   Anesthesia Other Findings Questionable thrombocytopenia twins  Reproductive/Obstetrics (+) Pregnancy                          Anesthesia Physical Anesthesia Plan  ASA: III  Anesthesia Plan: Epidural   Post-op Pain Management:    Induction:   Airway Management Planned:   Additional Equipment:   Intra-op Plan:   Post-operative Plan:   Informed Consent: I have reviewed the patients History and Physical, chart, labs and discussed the procedure including the risks, benefits and alternatives for the proposed anesthesia with the patient or authorized representative who has indicated his/her understanding and acceptance.     Plan Discussed with:   Anesthesia Plan Comments:         Anesthesia Quick Evaluation

## 2012-09-12 NOTE — Progress Notes (Addendum)
Lisa [name] is a 32 y.o. G1P0 at [redacted]w[redacted]d by ultrasound admitted for rupture of membranes I have been watching this patient all day and monitoring FHR and progress of contractions throughout the day, but volume of patients in hospital has precluded writing other notes.  FHR on both babies has been stable all day. UCs have progressed to every 2-3 minutes. Cervix has not changed.    Subjective: Getting epidural now.   Objective: BP 129/75  Pulse 79  Temp 97.5 F (36.4 C) (Oral)  Resp 20  Ht 5\' 4"  (1.626 m)  Wt 114.306 kg (252 lb)  BMI 43.26 kg/m2  SpO2 100%      FHT:  FHR: 140-150 on both twins bpm, variability: moderate,  accelerations:  Present,  decelerations:  Absent   Accels are small UC:   regular, every 3 minutes SVE:   Dilation: 4.5 Effacement (%): 90 Station: 0 Exam by:: Lynda Rainwater rn   Labs: Lab Results  Component Value Date   WBC 4.9 09/12/2012   HGB 11.6* 09/12/2012   HCT 35.3* 09/12/2012   MCV 79.3 09/12/2012   PLT 109* 09/12/2012    Assessment / Plan: Augmentation of labor, progressing well  Labor: Progressing on Pitocin, will continue to increase then AROM no AROM necessary.  Preeclampsia:   Fetal Wellbeing:  Category II Pain Control:  Epidural I/D:  n/a Anticipated MOD:  NSVD  Lisa [name] 09/12/2012, 5:00 PM  US done earlier reported that both babies are vertex. Twin B was reported to be somewhat variable in position at times

## 2012-09-12 NOTE — Progress Notes (Signed)
Pt reports decreased fetal mvmt.  NST reactive x2.  Pt has diabetes and did not bring CBG log.  2 hr pp today is 113.  Pt c/o LOF.  SSE:  Copious pooling and nitrizine positive.  Cervix 4-5 cm/100/0 .  Pt has ROM and needs to be augmented.  Cultures done last week.  Dr. Macon Large, L & D attending is aware and will admit pt.  Has been vtx/vtx for past few [place], but will confirm position by [place] on floor.  Pt still complaining of nasal stuffines.  Will need Flonase.  Ears have copious wax and needs to be irrigated with home kit.  RN could possibly do on post partum floor.

## 2012-09-12 NOTE — Anesthesia Procedure Notes (Signed)

## 2012-09-13 LAB — CBC
HCT: 31.2 % — ABNORMAL LOW (ref 36.0–46.0)
HCT: 35.7 % — ABNORMAL LOW (ref 36.0–46.0)
Hemoglobin: 12.1 g/dL (ref 12.0–15.0)
Platelets: 110 10*3/uL — ABNORMAL LOW (ref 150–400)
RBC: 3.98 MIL/uL (ref 3.87–5.11)
RBC: 4.55 MIL/uL (ref 3.87–5.11)
RDW: 15.6 % — ABNORMAL HIGH (ref 11.5–15.5)
WBC: 9.8 10*3/uL (ref 4.0–10.5)

## 2012-09-13 MED ORDER — LACTATED RINGERS IV SOLN: 500.0000 mL | Freq: Once | INTRAVENOUS | Status: DC

## 2012-09-13 NOTE — Progress Notes (Signed)
Post Partum Day 1 Subjective: no complaints, up ad lib, voiding, tolerating PO and + flatus. Pt without dizziness, SOB, calf pain, vaginal d/c.  Objective: Blood pressure 113/70, pulse 70, temperature 98 F (36.7 C), temperature source Oral, resp. rate 18, height 5\' 4"  (1.626 m), weight 114.306 kg (252 lb), SpO2 100.00%, unknown if currently breastfeeding.  Physical Exam:  General: alert, cooperative and appears stated age Lochia: appropriate Uterine Fundus: firm DVT Evaluation: No evidence of DVT seen on physical exam. Negative Homan's sign. No significant calf/ankle edema.   Basename 09/13/12 0605 09/12/12 2332  HGB 10.5* 12.1  HCT 31.2* 35.7*    Assessment/Plan: Plan for discharge tomorrow   LOS: 1 day   Gildardo Cranker 09/13/2012, 7:23 AM    I have seen the patient with resident and agree with the above.

## 2012-09-13 NOTE — Progress Notes (Signed)
Ur chart review completed.  

## 2012-09-13 NOTE — Anesthesia Postprocedure Evaluation (Signed)
  Anesthesia Post-op Note  Patient: Lisa [name]  Procedure(s) Performed: * No procedures listed *  Patient Location: Mother/Baby  Anesthesia Type:Epidural  Level of Consciousness: awake, alert  and oriented  Airway and Oxygen Therapy: Patient Spontanous Breathing  Post-op Pain: mild  Post-op Assessment: Patient's Cardiovascular Status Stable, Respiratory Function Stable, No headache, No backache, No residual numbness and No residual motor weakness  Post-op Vital Signs: stable  Complications: No apparent anesthesia complications

## 2012-09-14 MED ORDER — IBUPROFEN 600 MG PO TABS: 600.0000 mg | ORAL_TABLET | Freq: Four times a day (QID) | ORAL | Status: DC

## 2012-09-14 NOTE — Discharge Summary (Signed)
Obstetric Discharge Summary Reason for Admission: onset of labor and rupture of membranes Prenatal Procedures: none Intrapartum Procedures: spontaneous vaginal delivery- twins Postpartum Procedures: none Complications-Operative and Postpartum: small 1st degree perineal  and periclitoral Hemoglobin  Date Value Range Status  09/13/2012 10.5* 12.0 - 15.0 g/dL Final  1/61/0960 45.4   Final     HCT  Date Value Range Status  09/13/2012 31.2* 36.0 - 46.0 % Final  04/12/2012 32   Final   Lisa [name] is a 32 y.o. G1P0 with IUP with di/di twins at [redacted]w[redacted]d presenting for SROM, early labor. She was being seen in the [facility] today and was noted to be grossly ruptured. Twins are vertex/vertex, patient desires vaginal delivery. Followed at [facility] for A2GDM. No vaginal bleeding with active fetal movement.  Prenatal Course  Source of Care: [facility] with onset of care at 28 weeks, transfer of care from [place]  Pregnancy complications or risks:  Patient Active Problem List   Diagnosis   .  Twin gestation, dichorionic diamniotic   .  Gestational diabetes mellitus, antepartum   .  Short cervix, antepartum   .  Supervision of high-risk pregnancy     Physical Exam:  General: alert, cooperative and no distress Lochia: appropriate Uterine Fundus: firm DVT Evaluation: No evidence of DVT seen on physical exam.  Discharge Diagnoses: 36.5wk twins delivery; GDM  Discharge Information: Date: 09/14/2012 Activity: pelvic rest Diet: routine Medications: PNV and Ibuprofen Condition: stable Instructions: refer to practice specific booklet Discharge to: home   Newborn Data:   Hyacinth, Marcelli [[id number]]  Live born female  Birth Weight: 5 lb 10.3 oz (2560 g) APGAR: 9, 9   Eeva, Schlosser [[id number]]  Live born female  Birth Weight: 4 lb 15 oz (2240 g) APGAR: 9, 9  Home with mother. Breastfeeding; abstinence as husband is in [place].  Cam Hai 09/14/2012, 9:10 AM

## 2012-09-14 NOTE — Progress Notes (Signed)
Post Partum Day 2 Subjective: no complaints, up ad lib, voiding, tolerating PO, + flatus and no SOB, chest pain.  Patient noticed some edema in her lower extremities.  Patient still unable to produce any milk.    Objective: Blood pressure 120/73, pulse 76, temperature 97.4 F (36.3 C), temperature source Oral, resp. rate 16, height 5\' 4"  (1.626 m), weight 252 lb (114.306 kg), SpO2 95.00%, unknown if currently breastfeeding.  Physical Exam:  General: alert, cooperative, appears stated age and no distress Lochia: appropriate Uterine Fundus: firm DVT Evaluation: No evidence of DVT seen on physical exam. Negative Homan's sign. Calf/Ankle edema is present, 2+ Heart: RRR, no extra sounds noted Lungs: clear and equal breath sounds bilaterally. ABD: uterus firm to palpation, tenderness in the RLQ and LLQ   Basename 09/13/12 0605 09/12/12 2332  HGB 10.5* 12.1  HCT 31.2* 35.7*    Assessment/Plan: Breastfeeding and Lactation consult.  Patient still unable to produce milk but is supplementing with a bottle.  Plan for discharge today.   LOS: 2 days   Evalee Mutton 09/14/2012, 7:42 AM

## 2012-09-14 NOTE — Progress Notes (Signed)
I have seen and examined this patient and I agree with the above. Cam Hai 9:09 AM 09/14/2012

## 2012-09-16 ENCOUNTER — Other Ambulatory Visit: Payer: Self-pay

## 2012-09-16 LAB — TYPE AND SCREEN
ABO/RH(D): A POS
Antibody Screen: NEGATIVE
Unit division: 0

## 2012-09-17 NOTE — Progress Notes (Signed)
NST reactive x2 pn 08/26/12

## 2012-09-22 ENCOUNTER — Emergency Department (HOSPITAL_BASED_OUTPATIENT_CLINIC_OR_DEPARTMENT_OTHER): Payer: Self-pay

## 2012-09-22 ENCOUNTER — Encounter (HOSPITAL_BASED_OUTPATIENT_CLINIC_OR_DEPARTMENT_OTHER): Payer: Self-pay | Admitting: *Deleted

## 2012-09-22 ENCOUNTER — Inpatient Hospital Stay (HOSPITAL_BASED_OUTPATIENT_CLINIC_OR_DEPARTMENT_OTHER)
Admission: EM | Admit: 2012-09-22 | Discharge: 2012-09-24 | DRG: 776 | Disposition: A | Discharge status: Home / Self Care | Payer: Self-pay | Attending: Internal Medicine | Admitting: Internal Medicine

## 2012-09-22 DIAGNOSIS — O99893 Other specified diseases and conditions complicating puerperium: Secondary | ICD-10-CM | POA: Diagnosis present

## 2012-09-22 DIAGNOSIS — E109 Type 1 diabetes mellitus without complications: Secondary | ICD-10-CM | POA: Diagnosis present

## 2012-09-22 DIAGNOSIS — O2493 Unspecified diabetes mellitus in the puerperium: Secondary | ICD-10-CM | POA: Diagnosis present

## 2012-09-22 DIAGNOSIS — N10 Acute tubulo-interstitial nephritis: Secondary | ICD-10-CM | POA: Diagnosis present

## 2012-09-22 DIAGNOSIS — R1110 Vomiting, unspecified: Secondary | ICD-10-CM | POA: Diagnosis present

## 2012-09-22 DIAGNOSIS — O2390 Unspecified genitourinary tract infection in pregnancy, unspecified trimester: Principal | ICD-10-CM | POA: Diagnosis present

## 2012-09-22 DIAGNOSIS — E876 Hypokalemia: Secondary | ICD-10-CM | POA: Diagnosis present

## 2012-09-22 DIAGNOSIS — R000 Tachycardia, unspecified: Secondary | ICD-10-CM | POA: Diagnosis present

## 2012-09-22 LAB — CBC WITH DIFFERENTIAL/PLATELET
Basophils Absolute: 0 10*3/uL (ref 0.0–0.1)
Basophils Relative: 0 % (ref 0–1)
Eosinophils Relative: 0 % (ref 0–5)
HCT: 30.2 % — ABNORMAL LOW (ref 36.0–46.0)
Hemoglobin: 10.3 g/dL — ABNORMAL LOW (ref 12.0–15.0)
Lymphocytes Relative: 18 % (ref 12–46)
Monocytes Relative: 11 % (ref 3–12)
Neutro Abs: 8.4 10*3/uL — ABNORMAL HIGH (ref 1.7–7.7)
RBC: 3.97 MIL/uL (ref 3.87–5.11)
RDW: 15.6 % — ABNORMAL HIGH (ref 11.5–15.5)
WBC: 11.8 10*3/uL — ABNORMAL HIGH (ref 4.0–10.5)

## 2012-09-22 LAB — URINALYSIS, ROUTINE W REFLEX MICROSCOPIC
Glucose, UA: NEGATIVE mg/dL
pH: 6.5 (ref 5.0–8.0)

## 2012-09-22 LAB — WET PREP, GENITAL

## 2012-09-22 LAB — COMPREHENSIVE METABOLIC PANEL
AST: 31 U/L (ref 0–37)
CO2: 22 mEq/L (ref 19–32)
Chloride: 103 mEq/L (ref 96–112)
Creatinine, Ser: 0.7 mg/dL (ref 0.50–1.10)
GFR calc non Af Amer: >90 mL/min (ref >90)
Total Bilirubin: 0.3 mg/dL (ref 0.3–1.2)

## 2012-09-22 LAB — URINE MICROSCOPIC-ADD ON

## 2012-09-22 MED ORDER — SODIUM CHLORIDE 0.9 % IV BOLUS (SEPSIS)
1000.0000 mL | Freq: Once | INTRAVENOUS | Status: AC
  Administered 2012-09-22: 1000 mL via INTRAVENOUS

## 2012-09-22 MED ORDER — ONDANSETRON HCL 4 MG/2ML IJ SOLN
4.0000 mg | Freq: Once | INTRAMUSCULAR | Status: AC
  Administered 2012-09-22: 4 mg via INTRAVENOUS
  Filled 2012-09-22: qty 2

## 2012-09-22 MED ORDER — MORPHINE SULFATE 2 MG/ML IJ SOLN
2.0000 mg | Freq: Once | INTRAMUSCULAR | Status: AC
  Administered 2012-09-22: 2 mg via INTRAVENOUS
  Filled 2012-09-22: qty 1

## 2012-09-22 MED ORDER — DEXTROSE 5 % IV SOLN
1.0000 g | Freq: Once | INTRAVENOUS | Status: AC
  Administered 2012-09-22: 1 g via INTRAVENOUS
  Filled 2012-09-22: qty 10

## 2012-09-22 MED ORDER — POTASSIUM CHLORIDE 10 MEQ/100ML IV SOLN
10.0000 meq | INTRAVENOUS | Status: AC
  Administered 2012-09-22: 10 meq via INTRAVENOUS
  Filled 2012-09-22: qty 100

## 2012-09-22 MED ORDER — MORPHINE SULFATE 4 MG/ML IJ SOLN
4.0000 mg | Freq: Once | INTRAMUSCULAR | Status: AC
  Administered 2012-09-22: 4 mg via INTRAVENOUS
  Filled 2012-09-22: qty 1

## 2012-09-22 NOTE — ED Notes (Signed)
Pt c/o right flank pain x 2 days vaginal delivery 1/13

## 2012-09-22 NOTE — Progress Notes (Signed)
PENDING ACCEPTANCE TRANFER NOTE:  Call received from:   Dr Ranae Palms of the EDP.  REASON FOR REQUESTING TRANSFER:  Persistent N/V with pyelonephritis, recent vaginal twin delivery.  HPI:  Patient has vaginal delivery of twins 11 days ago, presents with flank pain.  No abdominal pain, no vaginal complaints.  Pelvic was done and unremarkable.  UA is positive, CT showed perinephric stranding and inflammation c/w pyelo, but no hydro.  She was to be discharged home after IV antibiotic, but turn for the worse with increase pain, nausea and vomiting.     PLAN:  According to telephone report, this patient was accepted for transfer to medical floor,   Under [facility] team: [id number],  I have requested an order be written to call Flow Manager at [phone] upon patient arrival to the floor for final physician assignment who will do the admission and give admitting orders.  SIGNEDHouston Siren, MD [facility]  09/22/2012, 11:28 PM

## 2012-09-22 NOTE — ED Notes (Signed)
Assigned to room 5531 @ [facility], [facility] called for transport, RN notified.

## 2012-09-22 NOTE — ED Provider Notes (Signed)
History     CSN: [id number]  Arrival date & time 09/22/12  7829   First MD Initiated Contact with Patient 09/22/12 1855      Chief Complaint  Patient presents with  . Flank Pain    (Consider location/radiation/quality/duration/timing/severity/associated sxs/prior treatment) HPI Pt p/w R flank pain x 1 day with 1 episode of vomiting. Denies urinary symptoms, vaginal d/c. Pt has small amount of vag bleeding after vag in delivery 11 days ago. No complications. F/u with Ob/GYN normal. Pt denies abd pain, diarrhea, fever chills. +history of renal stones.  Past Medical History  Diagnosis Date  . Diabetes mellitus without complication     type 1    History reviewed. No pertinent past surgical history.  Family History  Problem Relation Age of Onset  . Other Neg Hx   . Diabetes Maternal Grandmother   . Diabetes Maternal Grandfather     History  Substance Use Topics  . Smoking status: Never Smoker   . Smokeless tobacco: Not on file  . Alcohol Use: No    OB History    Grav Para Term Preterm Abortions TAB SAB Ect Mult Living   1 1 0 1 0 0 0 0 1 2       Review of Systems  Constitutional: Positive for fatigue. Negative for fever and chills.  Respiratory: Negative for shortness of breath.   Cardiovascular: Negative for chest pain.  Gastrointestinal: Positive for nausea and vomiting. Negative for abdominal pain, diarrhea and constipation.  Genitourinary: Positive for flank pain and vaginal bleeding. Negative for dysuria, hematuria, vaginal discharge, vaginal pain and pelvic pain.  Musculoskeletal: Positive for back pain. Negative for myalgias.  Skin: Negative for rash and wound.  Neurological: Negative for dizziness, weakness, light-headedness, numbness and headaches.  All other systems reviewed and are negative.    Allergies  Review of patient's allergies indicates no known allergies.  Home Medications   Current Outpatient Rx  Name  Route  Sig  Dispense  Refill  .  IBUPROFEN 600 MG PO TABS   Oral   Take 1 tablet (600 mg total) by mouth every 6 (six) hours.   50 tablet   1   . LORATADINE 10 MG PO TABS   Oral   Take 10 mg by mouth daily as needed. For allergies         . PRENATAL 27-0.8 MG PO TABS   Oral   Take 1 tablet by mouth daily.   30 each   3     BP 142/84  Pulse 66  Temp 99.4 F (37.4 C) (Oral)  Resp 16  Ht 5\' 7"  (1.702 m)  Wt 250 lb (113.399 kg)  BMI 39.16 kg/m2  SpO2 100%  LMP 09/12/2012  Breastfeeding? Yes  Physical Exam  Nursing note and vitals reviewed. Constitutional: She is oriented to person, place, and time. She appears well-developed and well-nourished. No distress.  HENT:  Head: Normocephalic and atraumatic.  Mouth/Throat: Oropharynx is clear and moist.  Eyes: EOM are normal. Pupils are equal, round, and reactive to light.  Neck: Normal range of motion. Neck supple.  Cardiovascular: Normal rate and regular rhythm.   Pulmonary/Chest: Effort normal and breath sounds normal. No respiratory distress. She has no wheezes. She has no rales. She exhibits no tenderness.  Abdominal: Soft. Bowel sounds are normal. She exhibits no distension and no mass. There is no tenderness. There is no rebound and no guarding.  Musculoskeletal: Normal range of motion. She exhibits tenderness (R flank tenderness  to percussion). She exhibits no edema.  Neurological: She is alert and oriented to person, place, and time.  Skin: Skin is warm and dry. No rash noted. No erythema.  Psychiatric: She has a normal mood and affect. Her behavior is normal.    ED Course  Procedures (including critical care time)  Labs Reviewed  URINALYSIS, ROUTINE W REFLEX MICROSCOPIC - Abnormal; Notable for the following:    APPearance TURBID (*)     Hgb urine dipstick LARGE (*)     Protein, ur 100 (*)     Leukocytes, UA LARGE (*)     All other components within normal limits  CBC WITH DIFFERENTIAL - Abnormal; Notable for the following:    WBC 11.8 (*)       Hemoglobin 10.3 (*)     HCT 30.2 (*)     MCV 76.1 (*)     MCH 25.9 (*)     RDW 15.6 (*)     Neutro Abs 8.4 (*)     Monocytes Absolute 1.3 (*)     All other components within normal limits  COMPREHENSIVE METABOLIC PANEL - Abnormal; Notable for the following:    Potassium 2.9 (*)     Glucose, Bld 110 (*)     Albumin 2.1 (*)     Alkaline Phosphatase 246 (*)     All other components within normal limits  URINE MICROSCOPIC-ADD ON - Abnormal; Notable for the following:    Squamous Epithelial / LPF FEW (*)     Bacteria, UA MANY (*)     All other components within normal limits  WET PREP, GENITAL - Abnormal; Notable for the following:    Clue Cells Wet Prep HPF POC FEW (*)     WBC, Wet Prep HPF POC MANY (*)     All other components within normal limits  URINE CULTURE  GC/CHLAMYDIA PROBE AMP  CULTURE, BLOOD (ROUTINE X 2)  CULTURE, BLOOD (ROUTINE X 2)   Ct Abdomen Pelvis Wo Contrast  09/22/2012  *RADIOLOGY REPORT*  Clinical Data: Right flank pain.  Status post vaginal delivery 09/12/2012.  CT ABDOMEN AND PELVIS WITHOUT CONTRAST  Technique:  Multidetector CT imaging of the abdomen and pelvis was performed following the standard protocol without intravenous contrast.  Comparison: None.  Findings: Lung bases are clear.  No pleural or pericardial effusion.  The right kidney appears markedly swollen with extensive stranding about the right kidney and ureter. There is mild fullness of the right renal collecting system.  No urinary tract stones identified on the right or left.  The left kidney is unremarkable.  The spleen, liver, gallbladder, adrenal glands and pancreas appear normal.  There are some calcified retroperitoneal lymph nodes but no pathologic lymph nodes by CT size criteria identified.  The uterus is bulky with a small amount of air within it consistent with recent childbirth.  There is no focal fluid collection. Laxity of the ventral abdominal wall is noted without frank hernia. No focal  bony abnormality.  IMPRESSION:  1.  Markedly edematous appearance of the right kidney with extensive perirenal and periureteral stranding may be due to infectious or inflammatory process.  No obstructing stone is identified.  Mild fullness of the right renal collecting system is noted. 2.  Bulky appearance of the uterus with a small amount of air within the endometrial canal compatible with recent vaginal delivery.   Original Report Authenticated By: Holley Dexter, M.D.      1. Pyelonephritis, acute   2. Vomiting  3. Hypokalemia       MDM  Pelvic performed by midlevel. No D/C, CMT, adnexal tenderness  Pt symptoms worsened in ED and required repeat dosing of pain and nausea medication. Discussed with Dr Conley Rolls who accepted pt in transfer to observation med/surg bed. Requested that blood culture be sent.  Pt in agreement with plan. Findings relayed through interpreter           Loren Racer, MD 09/22/12 [id number]

## 2012-09-22 NOTE — ED Notes (Signed)
Pt c/o return of increase pain and vomiting-EDP [place] was notified-orders received

## 2012-09-23 ENCOUNTER — Encounter (HOSPITAL_COMMUNITY): Payer: Self-pay | Admitting: *Deleted

## 2012-09-23 DIAGNOSIS — E876 Hypokalemia: Secondary | ICD-10-CM | POA: Diagnosis present

## 2012-09-23 DIAGNOSIS — R1110 Vomiting, unspecified: Secondary | ICD-10-CM | POA: Diagnosis present

## 2012-09-23 DIAGNOSIS — N10 Acute tubulo-interstitial nephritis: Secondary | ICD-10-CM

## 2012-09-23 LAB — CBC
Platelets: 156 10*3/uL (ref 150–400)
RBC: 3.72 MIL/uL — ABNORMAL LOW (ref 3.87–5.11)
WBC: 11.8 10*3/uL — ABNORMAL HIGH (ref 4.0–10.5)

## 2012-09-23 LAB — BASIC METABOLIC PANEL
Calcium: 8.3 mg/dL — ABNORMAL LOW (ref 8.4–10.5)
GFR calc non Af Amer: >90 mL/min (ref >90)
Sodium: 141 mEq/L (ref 135–145)

## 2012-09-23 LAB — GC/CHLAMYDIA PROBE AMP
CT Probe RNA: NEGATIVE
GC Probe RNA: NEGATIVE

## 2012-09-23 MED ORDER — DEXTROSE 5 % IV SOLN
1.0000 g | INTRAVENOUS | Status: DC
  Administered 2012-09-23: 1 g via INTRAVENOUS
  Filled 2012-09-23 (×2): qty 10

## 2012-09-23 MED ORDER — ACETAMINOPHEN 325 MG PO TABS
650.0000 mg | ORAL_TABLET | Freq: Four times a day (QID) | ORAL | Status: DC | PRN
  Administered 2012-09-23 – 2012-09-24 (×3): 650 mg via ORAL
  Filled 2012-09-23 (×3): qty 2

## 2012-09-23 MED ORDER — ACETAMINOPHEN 650 MG RE SUPP: 650.0000 mg | Freq: Four times a day (QID) | RECTAL | Status: DC | PRN

## 2012-09-23 MED ORDER — ONDANSETRON HCL 4 MG PO TABS: 4.0000 mg | ORAL_TABLET | Freq: Four times a day (QID) | ORAL | Status: DC | PRN

## 2012-09-23 MED ORDER — SODIUM CHLORIDE 0.9 % IJ SOLN
3.0000 mL | Freq: Two times a day (BID) | INTRAMUSCULAR | Status: DC
  Administered 2012-09-23: 3 mL via INTRAVENOUS

## 2012-09-23 MED ORDER — SODIUM CHLORIDE 0.9 % IV SOLN
INTRAVENOUS | Status: DC
  Administered 2012-09-23 – 2012-09-24 (×4): via INTRAVENOUS
  Filled 2012-09-23 (×7): qty 1000

## 2012-09-23 MED ORDER — ENOXAPARIN SODIUM 40 MG/0.4ML ~~LOC~~ SOLN
40.0000 mg | SUBCUTANEOUS | Status: DC
  Administered 2012-09-23 – 2012-09-24 (×2): 40 mg via SUBCUTANEOUS
  Filled 2012-09-23 (×2): qty 0.4

## 2012-09-23 MED ORDER — PRENATAL 27-0.8 MG PO TABS
1.0000 | ORAL_TABLET | Freq: Every day | ORAL | Status: DC
  Administered 2012-09-23: 1 via ORAL
  Filled 2012-09-23 (×2): qty 1

## 2012-09-23 MED ORDER — ONDANSETRON HCL 4 MG/2ML IJ SOLN: 4.0000 mg | Freq: Four times a day (QID) | INTRAMUSCULAR | Status: DC | PRN

## 2012-09-23 MED ORDER — POTASSIUM CHLORIDE CRYS ER 20 MEQ PO TBCR
60.0000 meq | EXTENDED_RELEASE_TABLET | Freq: Four times a day (QID) | ORAL | Status: AC
  Administered 2012-09-23 (×2): 60 meq via ORAL
  Filled 2012-09-23: qty 6

## 2012-09-23 NOTE — ED Notes (Signed)
Report called to [id number] RN

## 2012-09-23 NOTE — H&P (Signed)
Lisa [name] is an 32 y.o. female.   Patient was seen and examined on September 23, 2012. PCP - none. Chief Complaint: Right flank pain nausea and vomiting. HPI: 32 year-old female with no significant past history who has had recent twin delivery presented to the ER at [facility] with complaints of right flank pain back pain and fever and chills for 3 days. In addition patient also has been having nausea vomiting unable to keep anything. CT abdomen pelvis shows right-sided pyelonephritis. Patient has been admitted for IV antibiotics for pyelonephritis. Labs also reveal patient has hypokalemia probably from the nausea vomiting. Denies any chest pain or shortness of breath productive cough.  Past Medical History  Diagnosis Date  . Diabetes mellitus without complication     type 1    History reviewed. No pertinent past surgical history.  Family History  Problem Relation Age of Onset  . Other Neg Hx   . Diabetes Maternal Grandmother   . Diabetes Maternal Grandfather    Social History:  reports that she has never smoked. She does not have any smokeless tobacco history on file. She reports that she does not drink alcohol or use illicit drugs.  Allergies: No Known Allergies  Medications Prior to Admission  Medication Sig Dispense Refill  . ibuprofen (ADVIL,MOTRIN) 600 MG tablet Take 1 tablet (600 mg total) by mouth every 6 (six) hours.  50 tablet  1  . loratadine (CLARITIN) 10 MG tablet Take 10 mg by mouth daily as needed. For allergies      . Prenatal Vit-Fe Fumarate-FA (MULTIVITAMIN-PRENATAL) 27-0.8 MG TABS Take 1 tablet by mouth daily.  30 each  3    Results for orders placed during the hospital encounter of 09/22/12 (from the past 48 hour(s))  URINALYSIS, ROUTINE W REFLEX MICROSCOPIC     Status: Abnormal   Collection Time   09/22/12  7:03 PM      Component Value Range Comment   Color, Urine YELLOW  YELLOW    APPearance TURBID (*) CLEAR    Specific Gravity, Urine 1.010  1.005  - 1.030    pH 6.5  5.0 - 8.0    Glucose, UA NEGATIVE  NEGATIVE mg/dL    Hgb urine dipstick LARGE (*) NEGATIVE    Bilirubin Urine NEGATIVE  NEGATIVE    Ketones, ur NEGATIVE  NEGATIVE mg/dL    Protein, ur 161 (*) NEGATIVE mg/dL    Urobilinogen, UA 1.0  0.0 - 1.0 mg/dL    Nitrite NEGATIVE  NEGATIVE    Leukocytes, UA LARGE (*) NEGATIVE   URINE MICROSCOPIC-ADD ON     Status: Abnormal   Collection Time   09/22/12  7:03 PM      Component Value Range Comment   Squamous Epithelial / LPF FEW (*) RARE    WBC, UA TOO NUMEROUS TO COUNT  <3 WBC/hpf    RBC / HPF 7-10  <3 RBC/hpf    Bacteria, UA MANY (*) RARE   CBC WITH DIFFERENTIAL     Status: Abnormal   Collection Time   09/22/12  7:40 PM      Component Value Range Comment   WBC 11.8 (*) 4.0 - 10.5 K/uL    RBC 3.97  3.87 - 5.11 MIL/uL    Hemoglobin 10.3 (*) 12.0 - 15.0 g/dL    HCT 09.6 (*) 04.5 - 46.0 %    MCV 76.1 (*) 78.0 - 100.0 fL    MCH 25.9 (*) 26.0 - 34.0 pg  MCHC 34.1  30.0 - 36.0 g/dL    RDW 16.1 (*) 09.6 - 15.5 %    Platelets 158  150 - 400 K/uL    Neutrophils Relative 71  43 - 77 %    Lymphocytes Relative 18  12 - 46 %    Monocytes Relative 11  3 - 12 %    Eosinophils Relative 0  0 - 5 %    Basophils Relative 0  0 - 1 %    Neutro Abs 8.4 (*) 1.7 - 7.7 K/uL    Lymphs Abs 2.1  0.7 - 4.0 K/uL    Monocytes Absolute 1.3 (*) 0.1 - 1.0 K/uL    Eosinophils Absolute 0.0  0.0 - 0.7 K/uL    Basophils Absolute 0.0  0.0 - 0.1 K/uL   COMPREHENSIVE METABOLIC PANEL     Status: Abnormal   Collection Time   09/22/12  7:40 PM      Component Value Range Comment   Sodium 138  135 - 145 mEq/L    Potassium 2.9 (*) 3.5 - 5.1 mEq/L    Chloride 103  96 - 112 mEq/L    CO2 22  19 - 32 mEq/L    Glucose, Bld 110 (*) 70 - 99 mg/dL    BUN 10  6 - 23 mg/dL    Creatinine, Ser 0.45  0.50 - 1.10 mg/dL    Calcium 8.9  8.4 - 40.9 mg/dL    Total Protein 6.3  6.0 - 8.3 g/dL    Albumin 2.1 (*) 3.5 - 5.2 g/dL    AST 31  0 - 37 U/L    ALT 31  0 - 35 U/L     Alkaline Phosphatase 246 (*) 39 - 117 U/L    Total Bilirubin 0.3  0.3 - 1.2 mg/dL    GFR calc non Af Amer >90  >90 mL/min    GFR calc Af Amer >90  >90 mL/min   WET PREP, GENITAL     Status: Abnormal   Collection Time   09/22/12  7:59 PM      Component Value Range Comment   Yeast Wet Prep HPF POC NONE SEEN  NONE SEEN    Trich, Wet Prep NONE SEEN  NONE SEEN    Clue Cells Wet Prep HPF POC FEW (*) NONE SEEN    WBC, Wet Prep HPF POC MANY (*) NONE SEEN    Ct Abdomen Pelvis Wo Contrast  09/22/2012  *RADIOLOGY REPORT*  Clinical Data: Right flank pain.  Status post vaginal delivery 09/12/2012.  CT ABDOMEN AND PELVIS WITHOUT CONTRAST  Technique:  Multidetector CT imaging of the abdomen and pelvis was performed following the standard protocol without intravenous contrast.  Comparison: None.  Findings: Lung bases are clear.  No pleural or pericardial effusion.  The right kidney appears markedly swollen with extensive stranding about the right kidney and ureter. There is mild fullness of the right renal collecting system.  No urinary tract stones identified on the right or left.  The left kidney is unremarkable.  The spleen, liver, gallbladder, adrenal glands and pancreas appear normal.  There are some calcified retroperitoneal lymph nodes but no pathologic lymph nodes by CT size criteria identified.  The uterus is bulky with a small amount of air within it consistent with recent childbirth.  There is no focal fluid collection. Laxity of the ventral abdominal wall is noted without frank hernia. No focal bony abnormality.  IMPRESSION:  1.  Markedly edematous appearance of  the right kidney with extensive perirenal and periureteral stranding may be due to infectious or inflammatory process.  No obstructing stone is identified.  Mild fullness of the right renal collecting system is noted. 2.  Bulky appearance of the uterus with a small amount of air within the endometrial canal compatible with recent vaginal delivery.    Original Report Authenticated By: Holley Dexter, M.D.     Review of Systems  Constitutional: Positive for fever and chills.  HENT: Negative.   Eyes: Negative.   Respiratory: Negative.   Cardiovascular: Negative.   Gastrointestinal: Positive for nausea and vomiting.  Genitourinary: Positive for flank pain.  Musculoskeletal: Positive for back pain.  Skin: Negative.   Neurological: Negative.   Endo/Heme/Allergies: Negative.   Psychiatric/Behavioral: Negative.     Blood pressure 150/71, pulse 119, temperature 100.1 F (37.8 C), temperature source Oral, resp. rate 20, height 5\' 7"  (1.702 m), weight 106 kg (233 lb 11 oz), last menstrual period 09/12/2012, SpO2 98.00%, currently breastfeeding. Physical Exam  Constitutional: She is oriented to person, place, and time. She appears well-developed and well-nourished. No distress.  HENT:  Head: Normocephalic and atraumatic.  Right Ear: External ear normal.  Left Ear: External ear normal.  Nose: Nose normal.  Mouth/Throat: Oropharynx is clear and moist. No oropharyngeal exudate.  Eyes: Conjunctivae normal are normal. Pupils are equal, round, and reactive to light. Right eye exhibits no discharge. Left eye exhibits no discharge. No scleral icterus.  Neck: Normal range of motion. Neck supple.  Cardiovascular: Regular rhythm.        Tachycardia.  Respiratory: Effort normal and breath sounds normal. No respiratory distress. She has no wheezes. She has no rales.  GI: Soft. Bowel sounds are normal. She exhibits distension. There is no tenderness. There is no rebound.  Musculoskeletal: She exhibits no edema and no tenderness.  Neurological: She is alert and oriented to person, place, and time.  Skin: Skin is warm and dry. She is not diaphoretic.  Psychiatric: Her behavior is normal.     Assessment/Plan #1. Pyelonephritis - since patient is not able to eat or keep anything orally patient has been admitted for IV antibiotics. Patient is on  ceftriaxone which we will continue follow urine cultures. Continue IV fluids. #2. Hypokalemia - probably from her nausea vomiting and poor oral intake. Replace and recheck. #3. Recent vaginal delivery 2 weeks ago.  CODE STATUS - full code.  Taahir Grisby N. 09/23/2012, 3:59 AM

## 2012-09-23 NOTE — Progress Notes (Signed)
Addendum  Patient seen and examined, chart and data base reviewed.  I agree with the above assessment and plan.  For full details please see Mrs. Lisa Smothers NP note.  Rt pyelonephritis continue Rocephin.   Clint Lipps, MD [facility] Pager: [phone] 09/23/2012, 12:07 PM

## 2012-09-23 NOTE — Progress Notes (Signed)
[facility] PROGRESS NOTE  Lisa [name] NFA:[id number] DOB: [birth year] DOA: 09/22/2012 PCP: No primary provider on file.  Assessment/Plan: #1. Pyelonephritis - pain somewhat improved. Max temp 100.1. CT yields Markedly edematous appearance of the right kidney with  extensive perirenal and periureteral stranding may be due to infectious or inflammatory process. No obstructing stone is identified. Mild fullness of the right renal collecting system is noted.  Continue rocephin day #2.  Urine culture pending. Continue  IV fluids at 125/hr and supportive treatment . Will also mobilize pt a little more today.  #2. Hypokalemia - related to  nausea vomiting and poor oral intake.Labs pending.  Potassium in IV fluid. Will monitor.  #3. Recent vaginal delivery 2 weeks ago. Wet prep with few clue cells and many WBC related to recent delivery. Pt very anxious to get home to twins.  #4. Dm: diet controlled. Hga1c 11/13 5.9. Will monitor glucose via bmet. Glucose 110 09/22/12. Today's labs pending.     Code Status: full Family Communication: relative at bedside also translated Disposition Plan: hopefully home tomorrow.    Consultants:  none  Procedures:  none  Antibiotics:  Rocephin 09/22/12>>>  HPI/Subjective: Awake alert somewhat ill appearing. Reports no nausea and improved pain  Objective: Filed Vitals:   09/22/12 2248 09/22/12 2334 09/23/12 0057 09/23/12 0620  BP:  159/82 150/71 112/79  Pulse:  110 119 93  Temp: 99.4 F (37.4 C) 97.6 F (36.4 C) 100.1 F (37.8 C) 99.2 F (37.3 C)  TempSrc: Oral Oral Oral Oral  Resp:  20 20 20   Height:   5\' 7"  (1.702 m)   Weight:   106 kg (233 lb 11 oz)   SpO2:  98% 98% 97%    Intake/Output Summary (Last 24 hours) at 09/23/12 0801 Last data filed at 09/23/12 0600  Gross per 24 hour  Intake 423.75 ml  Output      0 ml  Net 423.75 ml   Filed Weights   09/22/12 1847 09/23/12 0057  Weight: 113.399 kg (250 lb) 106 kg (233 lb 11 oz)     Exam:   General:  Awake alert well nourished  Cardiovascular: RRR No MGR No LEE  Respiratory: normal effort BSCTAB No wheeze/rhonchi  Abdomen: soft, sluggish BS non-tender to palpation  Data Reviewed: Basic Metabolic Panel:  Lab 09/22/12 4696  NA 138  K 2.9*  CL 103  CO2 22  GLUCOSE 110*  BUN 10  CREATININE 0.70  CALCIUM 8.9  MG --  PHOS --   Liver Function Tests:  Lab 09/22/12 1940  AST 31  ALT 31  ALKPHOS 246*  BILITOT 0.3  PROT 6.3  ALBUMIN 2.1*   No results found for this basename: LIPASE:5,AMYLASE:5 in the last 168 hours No results found for this basename: AMMONIA:5 in the last 168 hours CBC:  Lab 09/22/12 1940  WBC 11.8*  NEUTROABS 8.4*  HGB 10.3*  HCT 30.2*  MCV 76.1*  PLT 158   Cardiac Enzymes: No results found for this basename: CKTOTAL:5,CKMB:5,CKMBINDEX:5,TROPONINI:5 in the last 168 hours BNP (last 3 results) No results found for this basename: PROBNP:3 in the last 8760 hours CBG: No results found for this basename: GLUCAP:5 in the last 168 hours  Recent Results (from the past 240 hour(s))  WET PREP, GENITAL     Status: Abnormal   Collection Time   09/22/12  7:59 PM      Component Value Range Status Comment   Yeast Wet Prep HPF POC NONE SEEN  NONE SEEN  Final    Trich, Wet Prep NONE SEEN  NONE SEEN Final    Clue Cells Wet Prep HPF POC FEW (*) NONE SEEN Final    WBC, Wet Prep HPF POC MANY (*) NONE SEEN Final      Studies: Ct Abdomen Pelvis Wo Contrast  09/22/2012  *RADIOLOGY REPORT*  Clinical Data: Right flank pain.  Status post vaginal delivery 09/12/2012.  CT ABDOMEN AND PELVIS WITHOUT CONTRAST  Technique:  Multidetector CT imaging of the abdomen and pelvis was performed following the standard protocol without intravenous contrast.  Comparison: None.  Findings: Lung bases are clear.  No pleural or pericardial effusion.  The right kidney appears markedly swollen with extensive stranding about the right kidney and ureter. There is mild  fullness of the right renal collecting system.  No urinary tract stones identified on the right or left.  The left kidney is unremarkable.  The spleen, liver, gallbladder, adrenal glands and pancreas appear normal.  There are some calcified retroperitoneal lymph nodes but no pathologic lymph nodes by CT size criteria identified.  The uterus is bulky with a small amount of air within it consistent with recent childbirth.  There is no focal fluid collection. Laxity of the ventral abdominal wall is noted without frank hernia. No focal bony abnormality.  IMPRESSION:  1.  Markedly edematous appearance of the right kidney with extensive perirenal and periureteral stranding may be due to infectious or inflammatory process.  No obstructing stone is identified.  Mild fullness of the right renal collecting system is noted. 2.  Bulky appearance of the uterus with a small amount of air within the endometrial canal compatible with recent vaginal delivery.   Original Report Authenticated By: Holley Dexter, M.D.     Scheduled Meds:   . cefTRIAXone (ROCEPHIN)  IV  1 g Intravenous Q24H  . enoxaparin (LOVENOX) injection  40 mg Subcutaneous Q24H  . multivitamin-prenatal  1 tablet Oral Daily  . sodium chloride  3 mL Intravenous Q12H   Continuous Infusions:   . sodium chloride 0.9 % 1,000 mL with potassium chloride 20 mEq infusion 125 mL/hr at 09/23/12 0551    Principal Problem:  *Pyelonephritis, acute Active Problems:  Hypokalemia  Vomiting    Time spent: 30 minutes    [facility]  [facility]  If 8PM-8AM, please contact night-coverage at [email or web address], password [facility] 09/23/2012, 8:01 AM  LOS: 1 day

## 2012-09-24 DIAGNOSIS — R1110 Vomiting, unspecified: Secondary | ICD-10-CM

## 2012-09-24 LAB — URINE CULTURE

## 2012-09-24 LAB — BASIC METABOLIC PANEL
BUN: 5 mg/dL — ABNORMAL LOW (ref 6–23)
GFR calc Af Amer: >90 mL/min (ref >90)
GFR calc non Af Amer: >90 mL/min (ref >90)
Potassium: 3.6 mEq/L (ref 3.5–5.1)

## 2012-09-24 MED ORDER — CEFUROXIME AXETIL 500 MG PO TABS: 500.0000 mg | ORAL_TABLET | Freq: Two times a day (BID) | ORAL | Status: DC

## 2012-09-24 MED ORDER — PRENATAL MULTIVITAMIN CH
1.0000 | ORAL_TABLET | Freq: Every day | ORAL | Status: DC
  Administered 2012-09-24: 1 via ORAL
  Filled 2012-09-24: qty 1

## 2012-09-24 MED ORDER — MORPHINE SULFATE 2 MG/ML IJ SOLN: 2.0000 mg | INTRAMUSCULAR | Status: DC | PRN

## 2012-09-24 MED ORDER — HYDROCODONE-ACETAMINOPHEN 5-325 MG PO TABS: 1.0000 | ORAL_TABLET | ORAL | Status: DC | PRN

## 2012-09-24 NOTE — Progress Notes (Signed)
NURSING PROGRESS NOTE  Aimi Essner [id number] Discharge Data: 09/24/2012 2:23 PM Attending Provider: No att. providers found PCP:No primary provider on file.     Kenneshia Wyka to be D/C'd Home per MD order.  Discussed with the patient the After Visit Summary and all questions fully answered. All IV's discontinued with no bleeding noted. All belongings returned to patient for patient to take home.   Last Vital Signs:  Blood pressure 138/89, pulse 71, temperature 98.3 F (36.8 C), temperature source Oral, resp. rate 18, height 5\' 7"  (1.702 m), weight 106 kg (233 lb 11 oz), last menstrual period 09/12/2012, SpO2 94.00%, currently breastfeeding.  Discharge Medication List   Medication List     As of 09/24/2012  2:23 PM    TAKE these medications         cefUROXime 500 MG tablet   Commonly known as: CEFTIN   Take 1 tablet (500 mg total) by mouth 2 (two) times daily.      ibuprofen 600 MG tablet   Commonly known as: ADVIL,MOTRIN   Take 1 tablet (600 mg total) by mouth every 6 (six) hours.        Rosalie Doctor, RN

## 2012-09-24 NOTE — Progress Notes (Signed)
NCM spoke to pt and gave permission to speak to her sister. NCM explained abx she can pick up from [facility] $38. NCM reviewed location for follow up care with [facility] or [facility]. Explained to call and schedule appt for follow up. Isidoro Donning RN CCM Case Mgmt phone [phone]

## 2012-09-24 NOTE — Discharge Summary (Signed)
Physician Discharge Summary  Mehreen Azizi ZOX:[id number] DOB: [birth year] DOA: 09/22/2012  PCP: No primary provider on file.  Admit date: 09/22/2012 Discharge date: 09/24/2012  Time spent: 40 minutes  Recommendations for Outpatient Follow-up:  1. Followup with obstetric doctor  Discharge Diagnoses:  Principal Problem:  *Pyelonephritis, acute Active Problems:  Hypokalemia  Vomiting   Discharge Condition: Stable  Diet recommendation: Regular  Filed Weights   09/22/12 1847 09/23/12 0057  Weight: 113.399 kg (250 lb) 106 kg (233 lb 11 oz)    History of present illness:  32 year-old female with no significant past history who has had recent twin delivery presented to the ER at [facility] with complaints of right flank pain back pain and fever and chills for 3 days. In addition patient also has been having nausea vomiting unable to keep anything. CT abdomen pelvis shows right-sided pyelonephritis. Patient has been admitted for IV antibiotics for pyelonephritis. Labs also reveal patient has hypokalemia probably from the nausea vomiting   Hospital Course:   1. Acute right-sided pyelonephritis: Patient presented to the hospital with fever, nausea and vomiting as well as left sided flank pain. Upon initial evaluation in the [facility] pelvic examination was done to rule out wound infection as she had recent vaginal delivery and perineal/periclitoral lacerations. Pelvic examination show no evidence of infection. Patient has urinalysis consistent with UTI, CT scan of the abdomen and pelvis was done and showed findings consistent with right-sided acute pyelonephritis. Patient started on Rocephin, the culture come back positive for pansensitive Escherichia coli, Ceftin was chosen because it's safety with lactation. Patient will be on antibiotics for a total 14 days.  2. Fever: Likely secondary to the pyelonephritis, there is no evidence of mastitis, wound infection or other  causes of fever. Patient defervesced before discharge.  3. Hypokalemia: Presented with potassium of 2.8, likely secondary to nausea/vomiting. Potassium aggressively repleted with oral supplementation, patient advised to consume high potassium diet.  4. Recent vaginal delivery: With viable outcome, delivered a boy and a girl, as mentioned above per ED evaluation no evidence of wound infection. Continue multi-vitamins.  5. History of gestational diabetes: Blood sugar is normal, hemoglobin A1c was checked is 5.6.  Procedures:  None, Recent Normal vaginal delivery. On 09/12/2012  Consultations:  None  Discharge Exam: Filed Vitals:   09/23/12 0837 09/23/12 1430 09/23/12 2116 09/24/12 0555  BP:  137/83 118/78 138/89  Pulse:  86 79 71  Temp: 99.2 F (37.3 C) 98.4 F (36.9 C) 98.8 F (37.1 C) 98.3 F (36.8 C)  TempSrc: Oral Oral Oral Oral  Resp:  18 20 18   Height:      Weight:      SpO2:  98% 97% 94%   General: Alert and awake, oriented x3, not in any acute distress. HEENT: anicteric sclera, pupils reactive to light and accommodation, EOMI CVS: S1-S2 clear, no murmur rubs or gallops Chest: clear to auscultation bilaterally, no wheezing, rales or rhonchi Abdomen: soft nontender, nondistended, normal bowel sounds, no organomegaly Extremities: no cyanosis, clubbing or edema noted bilaterally Neuro: Cranial nerves II-XII intact, no focal neurological deficits  Discharge Instructions  Discharge Orders    Future Appointments: Provider: Department: Dept Phone: Center:   10/19/2012 12:45 PM Deirdre Colin Mulders, CNM [facility] [phone] [facility]     Future Orders Please Complete By Expires   Increase activity slowly          Medication List     As of 09/24/2012 12:29 PM  TAKE these medications         cefUROXime 500 MG tablet   Commonly known as: CEFTIN   Take 1 tablet (500 mg total) by mouth 2 (two) times daily.      ibuprofen 600 MG tablet   Commonly known as:  ADVIL,MOTRIN   Take 1 tablet (600 mg total) by mouth every 6 (six) hours.          The results of significant diagnostics from this hospitalization (including imaging, microbiology, ancillary and laboratory) are listed below for reference.    Significant Diagnostic Studies: Ct Abdomen Pelvis Wo Contrast  09/22/2012  *RADIOLOGY REPORT*  Clinical Data: Right flank pain.  Status post vaginal delivery 09/12/2012.  CT ABDOMEN AND PELVIS WITHOUT CONTRAST  Technique:  Multidetector CT imaging of the abdomen and pelvis was performed following the standard protocol without intravenous contrast.  Comparison: None.  Findings: Lung bases are clear.  No pleural or pericardial effusion.  The right kidney appears markedly swollen with extensive stranding about the right kidney and ureter. There is mild fullness of the right renal collecting system.  No urinary tract stones identified on the right or left.  The left kidney is unremarkable.  The spleen, liver, gallbladder, adrenal glands and pancreas appear normal.  There are some calcified retroperitoneal lymph nodes but no pathologic lymph nodes by CT size criteria identified.  The uterus is bulky with a small amount of air within it consistent with recent childbirth.  There is no focal fluid collection. Laxity of the ventral abdominal wall is noted without frank hernia. No focal bony abnormality.  IMPRESSION:  1.  Markedly edematous appearance of the right kidney with extensive perirenal and periureteral stranding may be due to infectious or inflammatory process.  No obstructing stone is identified.  Mild fullness of the right renal collecting system is noted. 2.  Bulky appearance of the uterus with a small amount of air within the endometrial canal compatible with recent vaginal delivery.   Original Report Authenticated By: Holley Dexter, M.D.    US Ob Follow Up  09/09/2012  OBSTETRICAL ULTRASOUND: This exam was performed within a [facility]  Department. The OB US report was generated in the AS system, and faxed to the ordering physician.   This report is also available in [employer] and in the [employer]. See AS Obstetric US report.   US Ob Follow Up Addl Gest  09/09/2012  OBSTETRICAL ULTRASOUND: This exam was performed within a [facility] Department. The OB US report was generated in the AS system, and faxed to the ordering physician.   This report is also available in [employer] and in the [employer]. See [facility] Obstetric US report.    Microbiology: Recent Results (from the past 240 hour(s))  URINE CULTURE     Status: Normal   Collection Time   09/22/12  7:03 PM      Component Value Range Status Comment   Specimen Description URINE, CLEAN CATCH   Final    Special Requests NONE   Final    Culture  Setup Time 09/23/2012 02:17   Final    Colony Count 20,OOO COLONIES/ML   Final    Culture ESCHERICHIA COLI   Final    Report Status 09/24/2012 FINAL   Final    Organism ID, Bacteria ESCHERICHIA COLI   Final   WET PREP, GENITAL     Status: Abnormal   Collection Time   09/22/12  7:59 PM  Component Value Range Status Comment   Yeast Wet Prep HPF POC NONE SEEN  NONE SEEN Final    Trich, Wet Prep NONE SEEN  NONE SEEN Final    Clue Cells Wet Prep HPF POC FEW (*) NONE SEEN Final    WBC, Wet Prep HPF POC MANY (*) NONE SEEN Final   GC/CHLAMYDIA PROBE AMP     Status: Normal   Collection Time   09/22/12  7:59 PM      Component Value Range Status Comment   CT Probe RNA NEGATIVE  NEGATIVE Final    GC Probe RNA NEGATIVE  NEGATIVE Final      Labs: Basic Metabolic Panel:  Lab 09/24/12 4098 09/23/12 0852 09/22/12 1940  NA 143 141 138  K 3.6 2.8* 2.9*  CL 110 106 103  CO2 22 21 22   GLUCOSE 151* 167* 110*  BUN 5* 6 10  CREATININE 0.71 0.77 0.70  CALCIUM 8.8 8.3* 8.9  MG -- -- --  PHOS -- -- --   Liver Function Tests:  Lab 09/22/12 1940  AST 31  ALT 31  ALKPHOS 246*    BILITOT 0.3  PROT 6.3  ALBUMIN 2.1*   No results found for this basename: LIPASE:5,AMYLASE:5 in the last 168 hours No results found for this basename: AMMONIA:5 in the last 168 hours CBC:  Lab 09/23/12 0852 09/22/12 1940  WBC 11.8* 11.8*  NEUTROABS -- 8.4*  HGB 9.6* 10.3*  HCT 28.4* 30.2*  MCV 76.3* 76.1*  PLT 156 158   Cardiac Enzymes: No results found for this basename: CKTOTAL:5,CKMB:5,CKMBINDEX:5,TROPONINI:5 in the last 168 hours BNP: BNP (last 3 results) No results found for this basename: PROBNP:3 in the last 8760 hours CBG: No results found for this basename: GLUCAP:5 in the last 168 hours     Signed:  [facility]  [facility] 09/24/2012, 12:29 PM

## 2012-10-19 ENCOUNTER — Ambulatory Visit (INDEPENDENT_AMBULATORY_CARE_PROVIDER_SITE_OTHER): Payer: Self-pay | Admitting: Obstetrics and Gynecology

## 2012-10-19 ENCOUNTER — Encounter: Payer: Self-pay | Admitting: Obstetrics and Gynecology

## 2012-10-19 NOTE — Patient Instructions (Signed)
  Place postpartum visit patient instructions here.  

## 2012-10-19 NOTE — Progress Notes (Signed)
  Subjective:     Lisa [name] is a 32 y.o. female who presents for a postpartum visit. She is 5 weeks postpartum following a spontaneous vaginal delivery. Twins at 72w5dI have fully reviewed the prenatal and intrapartum course. The delivery was at 36.5 gestational weeks. Outcome: spontaneous vaginal delivery. Anesthesia: epidural. Postpartum course has been uncomplicated. Baby's course has been uncomplicated. Pecola Leisure is feeding by breast and bottle. Bleeding pink. Bleeding completely stopped, then started her period today. Bowel function is normal. Bladder function is normal. Patient is not sexually active. Contraception method is abstinence. Postpartum depression screening: negative. Husband is in [place] where she will viist in March. Declines contraception.   The following portions of the patient's history were reviewed and updated as appropriate: allergies, current medications, past family history, past medical history, past social history, past surgical history and problem list.  Review of Systems Pertinent items are noted in HPI.   Objective:    BP 141/97  Pulse 89  Temp(Src) 99.2 F (37.3 C) (Oral)  Wt 213 lb 14.4 oz (97.024 kg)  BMI 33.49 kg/m2  LMP 10/19/2012  Breastfeeding? Yes  General:  alert and no distress   Breasts:  inspection negative, no nipple discharge or bleeding, no masses or nodularity palpable and lactational  Lungs: clear to auscultation bilaterally  Heart:  regular rate and rhythm, S1, S2 normal, no murmur, click, rub or gallop  Abdomen: soft, non-tender; bowel sounds normal; no masses,  no organomegaly   Vulva:  normal  Vagina: normal vagina and good tone and support  Cervix:  retroverted  Corpus: normal  Adnexa:  not evaluated  Rectal Exam: Normal rectovaginal exam        Assessment:  [id number]   5 wks postpartum exam NSVD twins Pap smear not done at today's visit.   Plan:    1. Contraception: abstinence 2. Continue PNV until 6 wks 3. Follow up in: 10  months or as needed. Call if wants contraception.

## 2014-03-26 IMAGING — US US OB COMP EACH ADDL GEST+14 WKS
2 series · 15 of 28 positions shown · non-contrast
Comparison: none

[Series 1: us ob detail +14 wk · 11 of 68 slices shown (1 of 2)]
[im 1/68]
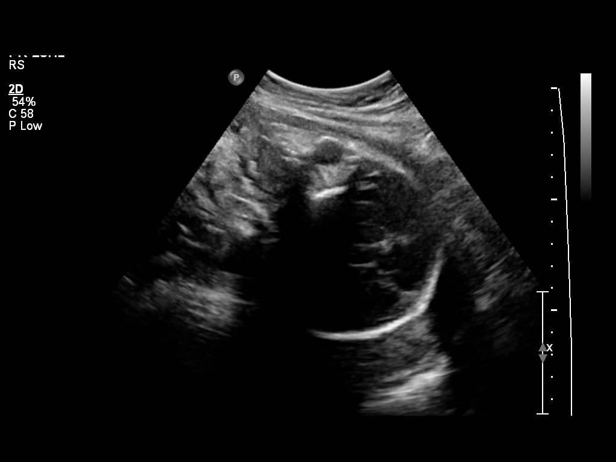
[im 7/68]
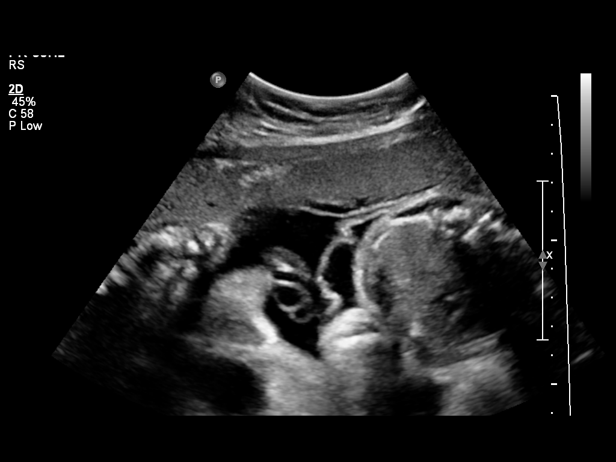
[im 14/68]
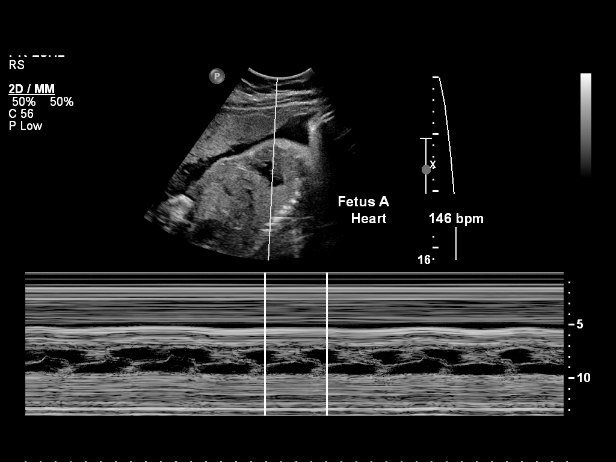
[im 21/68]
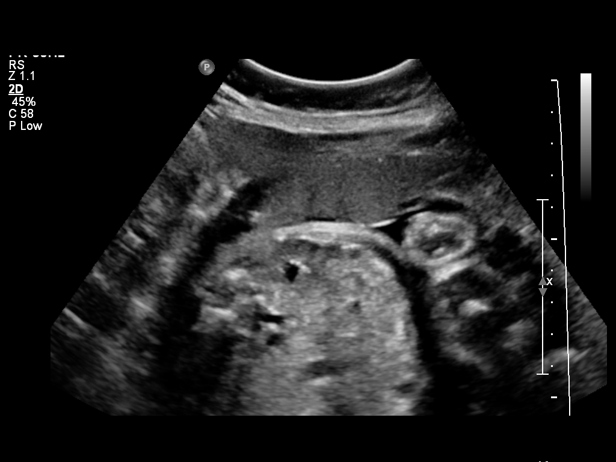
[im 27/68]
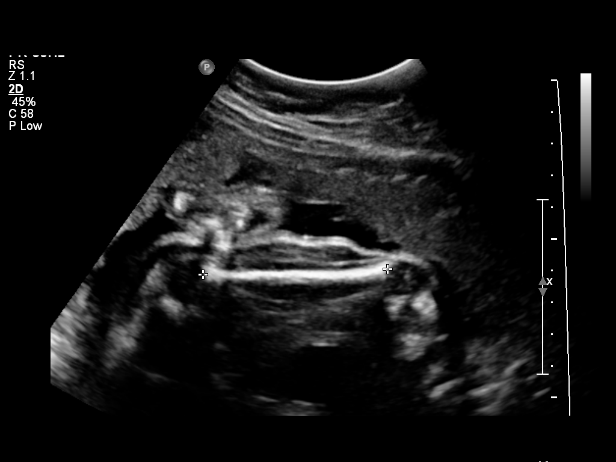
[im 34/68]
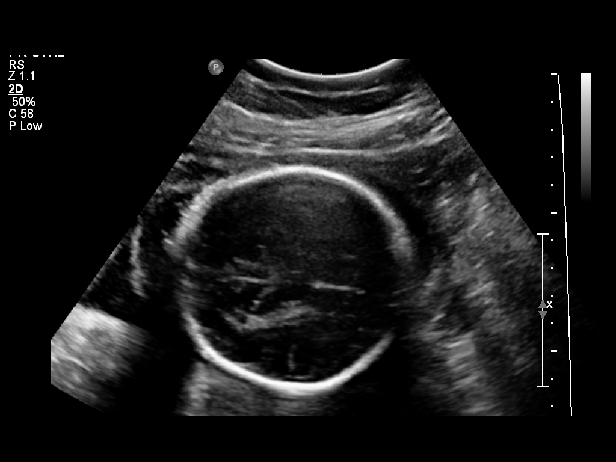
[im 41/68]
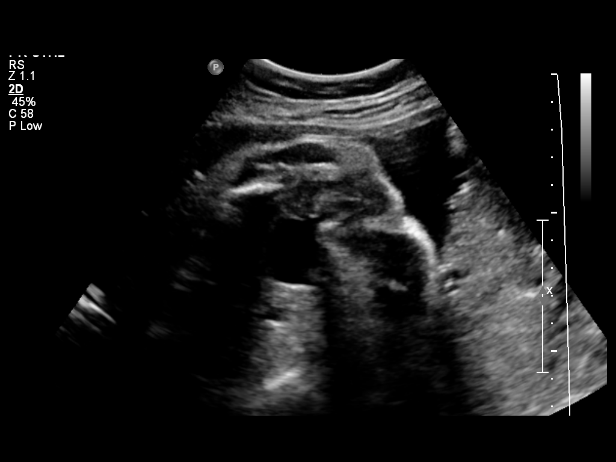
[im 47/68]
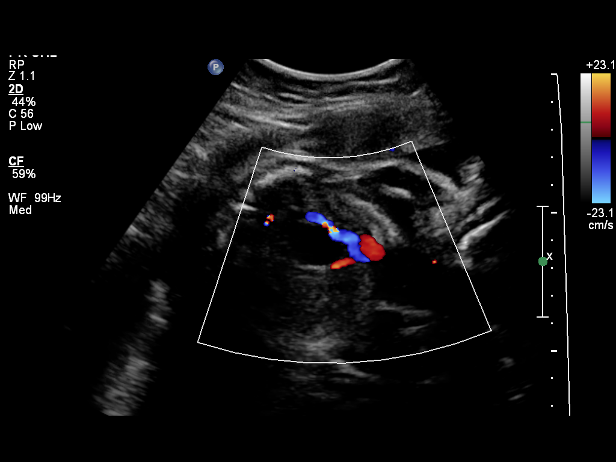
[im 51/68]
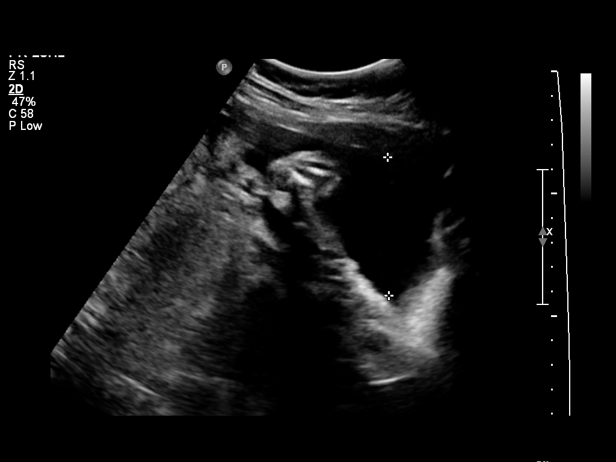
[im 57/68]
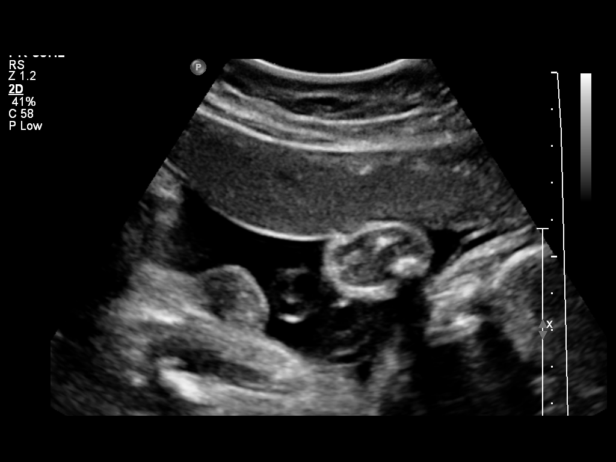
[im 64/68]
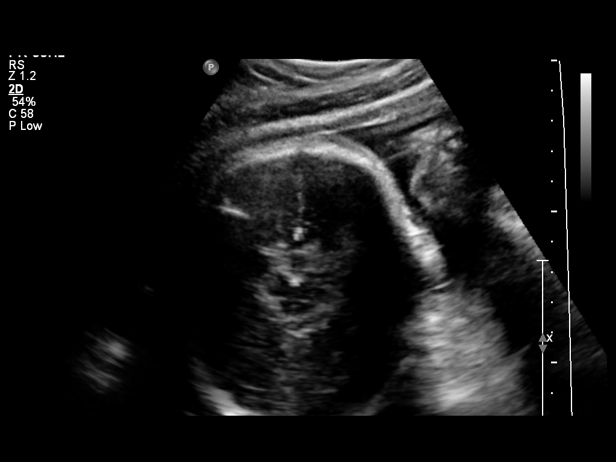

[Series 1: us ob detail +14 wk · 4 of 21 slices shown (2 of 2)]
[im 1/21]
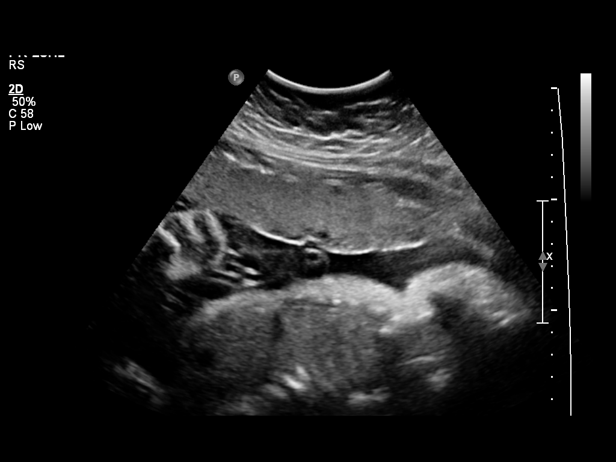
[im 7/21]
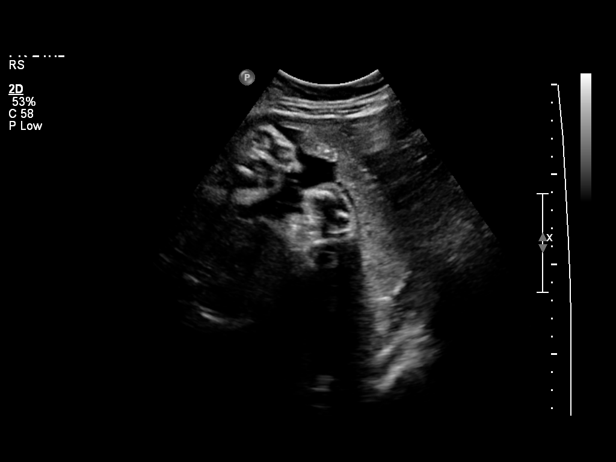
[im 14/21]
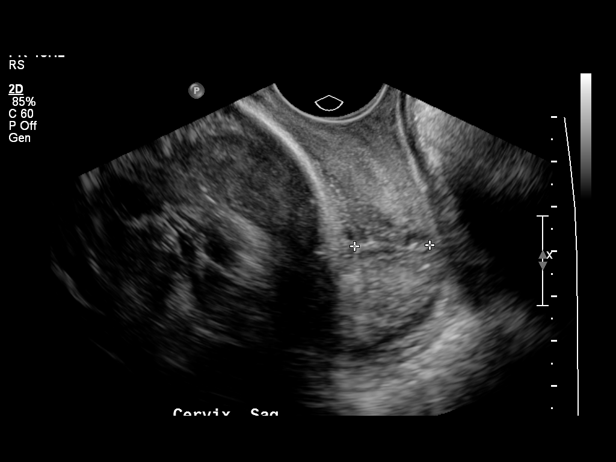
[im 21/21]
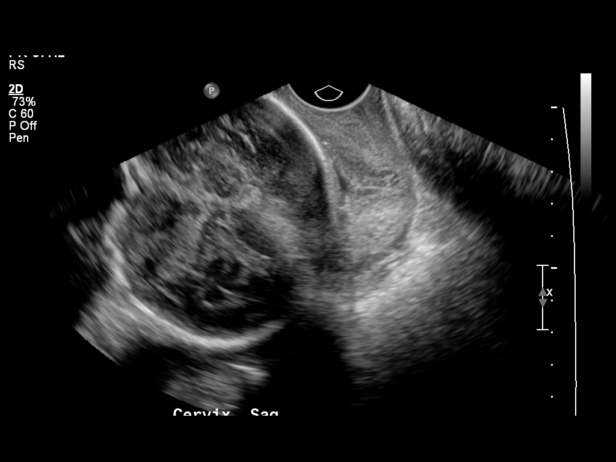

[15 of 28 positions shown; findings below may reference images not displayed]

OBSTETRICS REPORT
                      (Signed Final 07/29/2012 [DATE])

Service(s) Provided

 US OB COMP ADDL GEST + 14 WK                          76810.1
 US OB COMP + 14 WK                                    76805.1
 US OB TRANSVAGINAL                                    76817.0
Indications

 Diabetes - Gestational, A2 (medication controlled)
 Twin gestation, Di-Di
Fetal Evaluation (Fetus A)

 Num Of Fetuses:    2
 Fetal Heart Rate:  146                         bpm
 Cardiac Activity:  Observed
 Fetal Lie:         Right Fetus
 Presentation:      Cephalic
 Placenta:          Anterior, above cervical os

 Membrane Desc:     Dividing
                    Membrane seen
                    - Dichorionic.

 Amniotic Fluid
 AFI FV:      Subjectively within normal limits
                                             Larg Pckt:     4.1  cm
Biometry (Fetus A)

 BPD:     76.4  mm    G. Age:   30w 5d                CI:        75.04   70 - 86
                                                      FL/HC:      20.7   19.2 -

 HC:     279.8  mm    G. Age:   30w 5d       24  %    HC/AC:      1.09   0.99 -

 AC:     257.5  mm    G. Age:   30w 0d       35  %    FL/BPD:     75.9   71 - 87
 FL:        58  mm    G. Age:   30w 2d       37  %    FL/AC:      22.5   20 - 24

 Est. FW:    5676  gm      3 lb 6 oz     52  %     FW Discordancy      0 \ 2 %
Gestational Age (Fetus A)

 U/S Today:     30w 3d                                        EDD:   10/04/12
 Best:          30w 2d    Det. By:   Early Ultrasound         EDD:   10/05/12
Anatomy (Fetus A)
 Cranium:          Appears normal         Ductal Arch:      Basic anatomy
                                                            exam per order
 Fetal Cavum:      Not well visualized    Diaphragm:        Appears normal
 Ventricles:       Not well visualized    Stomach:          Appears normal, left
                                                            sided
 Choroid Plexus:   Appears normal         Abdomen:          Appears normal
 Cerebellum:       Not well visualized    Abdominal Wall:   Not well visualized
 Posterior Fossa:  Not well visualized    Cord Vessels:     Appears normal (3
                                                            vessel cord)
 Nuchal Fold:      Not applicable (>20    Kidneys:          Appear normal
                   wks GA)
 Lips:             Not well visualized    Bladder:          Appears normal
 Heart:            Appears normal         Spine:            Not well visualized
                   (4CH, axis, and
                   situs)
 RVOT:             Appears normal         Lower             Not well visualized
                                          Extremities:
 LVOT:             Appears normal         Upper             Not well visualized
                                          Extremities:
 Aortic Arch:      Basic anatomy
                   exam per order

 Other:  Male gender. Technically difficult due to advanced GA and fetal
         position.

Fetal Evaluation (Fetus B)

 Num Of Fetuses:    2
 Fetal Heart Rate:  165                         bpm
 Cardiac Activity:  Observed
 Fetal Lie:         Left Fetus
 Presentation:      Cephalic
 Placenta:          Anterior, above cervical os

 Amniotic Fluid
 AFI FV:      Subjectively within normal limits
                                             Larg Pckt:     5.6  cm
Biometry (Fetus B)

 BPD:     76.2  mm    G. Age:   30w 4d                CI:        75.59   70 - 86
                                                      FL/HC:      20.3   19.2 -

 HC:     277.9  mm    G. Age:   30w 3d       19  %    HC/AC:      1.07   0.99 -

 AC:       259  mm    G. Age:   30w 1d       39  %    FL/BPD:     73.9   71 - 87
 FL:      56.3  mm    G. Age:   29w 4d       19  %    FL/AC:      21.7   20 - 24
 Est. FW:    3186  gm      3 lb 5 oz     48  %     FW Discordancy         2  %
Gestational Age (Fetus B)

 U/S Today:     30w 1d                                        EDD:   10/06/12
 Best:          30w 2d    Det. By:   Early Ultrasound         EDD:   10/05/12
Anatomy (Fetus B)

 Cranium:          Appears normal         Ductal Arch:      Basic anatomy
                                                            exam per order
 Fetal Cavum:      Appears normal         Diaphragm:        Not well visualized
 Ventricles:       Appears normal         Stomach:          Appears normal, left
                                                            sided
 Choroid Plexus:   Appears normal         Abdomen:          Appears normal
 Cerebellum:       Appears normal         Abdominal Wall:   Not well visualized
 Posterior Fossa:  Appears normal         Cord Vessels:     Appears normal (3
                                                            vessel cord)
 Nuchal Fold:      Not applicable (>20    Kidneys:          Appear normal
                   wks GA)
 Lips:             Appears normal         Bladder:          Appears normal
 Heart:            Appears normal         Spine:            Not well visualized
                   (4CH, axis, and
                   situs)
 RVOT:             Not well visualized    Lower             Not well visualized
                                          Extremities:
 LVOT:             Not well visualized    Upper             Not well visualized
                                          Extremities:
 Aortic Arch:      Basic anatomy
                   exam per order

 Other:  Female gender. Technically difficult due to advanced GA and fetal
         position.
Cervix Uterus Adnexa

 Cervical Length:   2         cm

 Cervix:       Measured transvaginally.

 Adnexa:     No abnormality visualized.
Myomas

 Site                     L(cm)      W(cm)      D(cm)       Location
 Anterior                 3.1        1.4        2.8         Intramural

 Blood Flow                  RI       PI       Comments

Impression

 Living twin IUP, with assigned GA currently 30w 2d.
 Appropriate and concordant twin fetal growth.
 Suboptimal visualization of anatomy due to advanced GA,
 however no fetal anomalies seen involving visualized
 anatomy for either twin.
 Amniotic fluid volumes within normal limits for both twins.
 Shortened cervix, measuring 2.0cm in length.  No previa
 identified.
 Small anterior fibroid noted.

 questions or concerns.

## 2014-07-02 ENCOUNTER — Encounter: Payer: Self-pay | Admitting: Obstetrics and Gynecology

## 2015-09-27 LAB — BASIC METABOLIC PANEL: Glucose: 118

## 2016-11-13 ENCOUNTER — Ambulatory Visit (INDEPENDENT_AMBULATORY_CARE_PROVIDER_SITE_OTHER): Payer: Self-pay

## 2016-11-13 ENCOUNTER — Encounter (HOSPITAL_COMMUNITY): Payer: Self-pay | Admitting: Emergency Medicine

## 2016-11-13 ENCOUNTER — Ambulatory Visit (HOSPITAL_COMMUNITY)
Admission: EM | Admit: 2016-11-13 | Discharge: 2016-11-13 | Disposition: A | Discharge status: Home / Self Care | Payer: Medicaid Other | Attending: Internal Medicine | Admitting: Internal Medicine

## 2016-11-13 DIAGNOSIS — Z3202 Encounter for pregnancy test, result negative: Secondary | ICD-10-CM

## 2016-11-13 DIAGNOSIS — S39012A Strain of muscle, fascia and tendon of lower back, initial encounter: Secondary | ICD-10-CM

## 2016-11-13 DIAGNOSIS — K5901 Slow transit constipation: Secondary | ICD-10-CM | POA: Diagnosis not present

## 2016-11-13 DIAGNOSIS — M545 Low back pain, unspecified: Secondary | ICD-10-CM

## 2016-11-13 DIAGNOSIS — G44209 Tension-type headache, unspecified, not intractable: Secondary | ICD-10-CM

## 2016-11-13 LAB — POCT URINALYSIS DIP (DEVICE)
Bilirubin Urine: NEGATIVE
Glucose, UA: NEGATIVE mg/dL
Ketones, ur: NEGATIVE mg/dL
Leukocytes, UA: NEGATIVE
Nitrite: NEGATIVE
PROTEIN: NEGATIVE mg/dL
UROBILINOGEN UA: 0.2 mg/dL (ref 0.0–1.0)
pH: 5.5 (ref 5.0–8.0)

## 2016-11-13 LAB — POCT PREGNANCY, URINE: Preg Test, Ur: NEGATIVE

## 2016-11-13 NOTE — Discharge Instructions (Signed)
Recommend putting heat to the right low back and performing stretches to help with the muscle pain. For constipation obtaining MiraLAX and drink 3 consecutive 6 ounce glasses of liquid with one cap full of MiraLAX in each. If not having good results and 6 hours may repeat another 2 or 3 glasses. Likely the headache is tension type headache he may take Tylenol for this. Follow-up with a primary care provider is sent is possible.

## 2016-11-13 NOTE — ED Triage Notes (Signed)
[facility] a [facility] lower right side back pain and lower abdominal pain with nausea x 1 month.

## 2016-11-13 NOTE — ED Provider Notes (Signed)
CSN: [id number]     Arrival date & time 11/13/16  1239 History   First MD Initiated Contact with Patient 11/13/16 1404     Chief Complaint  Patient presents with  . Back Pain   (Consider location/radiation/quality/duration/timing/severity/associated sxs/prior Treatment) [name] is accompanied by English-speaking interpreter with complaints of right lower back pain for one month. Complaining of headache off and on for one month in the bitemporal areas and mid abdominal pain. She states the back pain is mostly constant. It is often exacerbated by certain movements such as turning at the waist and bending over. The abdominal pain primarily lower mid abdomen but migrates.  Review of systems:  Denies fever, chills, dyspnea, chest pain, shortness of breath, vomiting, diarrhea, upper respiratory symptoms, urinary symptoms.      Past Medical History:  Diagnosis Date  . Diabetes mellitus without complication (HCC)    type 1   History reviewed. No pertinent surgical history. Family History  Problem Relation Age of Onset  . Other Neg Hx   . Diabetes Maternal Grandmother   . Diabetes Maternal Grandfather    Social History  Substance Use Topics  . Smoking status: Never Smoker  . Smokeless tobacco: Not on file  . Alcohol use No   OB History    Gravida Para Term Preterm AB Living   1 1 0 1 0 2   SAB TAB Ectopic Multiple Live Births   0 0 0 1 2     Review of Systems  Constitutional: Negative.  Negative for fever.  Neurological: Positive for headaches. Negative for dizziness, seizures, syncope, facial asymmetry and speech difficulty.  All other systems reviewed and are negative.   Allergies  Patient has no known allergies.  Home Medications   Prior to Admission medications   Not on File   Meds Ordered and Administered this Visit  Medications - No data to display  BP 116/74 (BP Location: Right Arm)   Pulse 74   Temp 97.9 F (36.6 C) (Oral)   Resp 18    LMP 11/06/2016 (Exact Date)   SpO2 100%  No data found.   Physical Exam  Constitutional: She is oriented to person, place, and time. She appears well-developed and well-nourished. No distress.  HENT:  Head: Normocephalic and atraumatic.  Mouth/Throat: Oropharynx is clear and moist.  Eyes: Conjunctivae and EOM are normal. Pupils are equal, round, and reactive to light. Right eye exhibits no discharge. Left eye exhibits no discharge.  Neck: Normal range of motion. Neck supple.  Cardiovascular: Normal rate, regular rhythm and normal heart sounds.   Pulmonary/Chest: Effort normal and breath sounds normal. No respiratory distress.  Abdominal: Soft. She exhibits no distension and no mass. There is no rebound and no guarding.  Bowel sounds present but hypoactive. There is mild tenderness across the lower mid abdomen. The abdomen percusses is dull or flat.  Musculoskeletal: Normal range of motion. She exhibits no deformity.  Lymphadenopathy:    She has no cervical adenopathy.  Neurological: She is alert and oriented to person, place, and time. No cranial nerve deficit.  Skin: Skin is warm and dry. No rash noted.  Psychiatric: She has a normal mood and affect. Her behavior is normal.  Nursing note and vitals reviewed.   Urgent Care Course     Procedures (including critical care time)  Labs Review Labs Reviewed  POCT URINALYSIS DIP (DEVICE) - Abnormal; Notable for the following:       Result Value   Hgb  urine dipstick SMALL (*)    All other components within normal limits  POCT PREGNANCY, URINE    Imaging Review Dg Abd 1 View  Result Date: 11/13/2016 CLINICAL DATA:  Pain in the front and back for over 1 month, right lower quadrant EXAM: ABDOMEN - 1 VIEW COMPARISON:  09/22/2012, 09/09/2012 FINDINGS: Nonobstructed bowel-gas pattern. Faint calcification to the left of the lower L3 vertebral body is unchanged compared to CT from 2014 and may represent a calcified retroperitoneal node.  There is a rounded calcification within the pelvis which may represent a calcified fibroid. IMPRESSION: Nonobstructed bowel-gas pattern. Possible calcified fibroid in the pelvis. Electronically Signed   By: Jasmine PangKim  Fujinaga M.D.   On: 11/13/2016 14:37     Visual Acuity Review  Right Eye Distance:   Left Eye Distance:   Bilateral Distance:    Right Eye Near:   Left Eye Near:    Bilateral Near:         MDM   1. Acute right-sided low back pain without sciatica   2. Strain of lumbar region, initial encounter   3. Slow transit constipation   4. Tension headache    Recommend putting heat to the right low back and performing stretches to help with the muscle pain. For constipation obtaining MiraLAX and drink 3 consecutive 6 ounce glasses of liquid with one cap full of MiraLAX in each. If not having good results and 6 hours may repeat another 2 or 3 glasses. Likely the headache is tension type headache he may take Tylenol for this. Follow-up with a primary care provider is sent is possible.     Hayden Rasmussenavid Lawson [place], NP 11/13/16 [phone]

## 2017-04-04 ENCOUNTER — Encounter (HOSPITAL_COMMUNITY): Payer: Self-pay | Admitting: Family Medicine

## 2017-04-04 ENCOUNTER — Ambulatory Visit (HOSPITAL_COMMUNITY)
Admission: EM | Admit: 2017-04-04 | Discharge: 2017-04-04 | Disposition: A | Discharge status: Home / Self Care | Payer: Self-pay | Attending: Emergency Medicine | Admitting: Emergency Medicine

## 2017-04-04 DIAGNOSIS — H6123 Impacted cerumen, bilateral: Secondary | ICD-10-CM

## 2017-04-04 NOTE — ED Triage Notes (Signed)
Pt here for right ear pain 

## 2017-04-04 NOTE — ED Provider Notes (Signed)
  [facility]   [id number] 04/04/17 Arrival Time: 1316  ASSESSMENT & PLAN:  1. Bilateral impacted cerumen     No orders of the defined types were placed in this encounter.  Cerumen impaction cleared in clinic, tympanic membranes remain intact without evidence of trauma or perforation Reviewed expectations re: course of current medical issues. Questions answered. Outlined signs and symptoms indicating need for more acute intervention. Patient verbalized understanding. After Visit Summary given.   SUBJECTIVE:  Lisa [name] is a 36 y.o. female who presents with complaint of ear pain and pressure minutes been for 3 days. she believes she has too much wax in her ears. She has decreased hearing on the right ear, no dizziness, fever, congestion, chills, or other symptoms  ROS: As per HPI, otherwise negative.   OBJECTIVE:  Vitals:   04/04/17 1343  BP: 126/68  Pulse: 66  Resp: 18  Temp: 98.6 F (37 C)  SpO2: 100%     General appearance: alert; no distress HEENT: normocephalic; atraumatic; conjunctivae normal; Bilateral cerumen impaction; nasal mucosa normal; oral mucosa normal Neck: supple Lungs: clear to auscultation bilaterally Heart: regular rate and rhythm Abdomen: soft, non-tender Back: no CVA tenderness Extremities: no cyanosis or edema; symmetrical with no gross deformities Skin: warm and dry Neurologic: Grossly normal Psychological:  alert and cooperative; normal mood and affect Labs Reviewed - No data to display  No results found.  No Known Allergies  PMHx, SurgHx, SocialHx, Medications, and Allergies were reviewed in the Visit Navigator and updated as appropriate.      Lisa [name], Lisa Danish, NP 04/04/17 [phone]

## 2017-04-04 NOTE — Discharge Instructions (Signed)
Return as needed

## 2017-09-03 ENCOUNTER — Encounter (HOSPITAL_COMMUNITY): Payer: Self-pay | Admitting: Emergency Medicine

## 2017-09-03 ENCOUNTER — Ambulatory Visit (HOSPITAL_COMMUNITY)
Admission: EM | Admit: 2017-09-03 | Discharge: 2017-09-03 | Disposition: A | Discharge status: Home / Self Care | Payer: Self-pay | Attending: Family Medicine | Admitting: Family Medicine

## 2017-09-03 DIAGNOSIS — R109 Unspecified abdominal pain: Secondary | ICD-10-CM

## 2017-09-03 DIAGNOSIS — R350 Frequency of micturition: Secondary | ICD-10-CM

## 2017-09-03 DIAGNOSIS — G44209 Tension-type headache, unspecified, not intractable: Secondary | ICD-10-CM

## 2017-09-03 LAB — POCT URINALYSIS DIP (DEVICE)
Bilirubin Urine: NEGATIVE
Glucose, UA: NEGATIVE mg/dL
KETONES UR: NEGATIVE mg/dL
Leukocytes, UA: NEGATIVE
Nitrite: NEGATIVE
PH: 5.5 (ref 5.0–8.0)
PROTEIN: NEGATIVE mg/dL
Specific Gravity, Urine: >=1.03 (ref 1.005–1.030)
Urobilinogen, UA: 0.2 mg/dL (ref 0.0–1.0)

## 2017-09-03 MED ORDER — KETOROLAC TROMETHAMINE 60 MG/2ML IM SOLN
INTRAMUSCULAR | Status: AC
  Filled 2017-09-03: qty 2

## 2017-09-03 MED ORDER — TAMSULOSIN HCL 0.4 MG PO CAPS: 0.4000 mg | ORAL_CAPSULE | Freq: Every day | ORAL | 0 refills | Status: AC

## 2017-09-03 MED ORDER — KETOROLAC TROMETHAMINE 60 MG/2ML IM SOLN
60.0000 mg | Freq: Once | INTRAMUSCULAR | Status: AC
  Administered 2017-09-03: 60 mg via INTRAMUSCULAR

## 2017-09-03 NOTE — Discharge Instructions (Addendum)
We have given you an injection of Toradol for pain, this is an anti-inflammatory.  Please continue Ibuprofen for pain and headache.   If your headache changes, worsens, develop visual changes, nausea, vomiting, weakness, speech slurring please return.   Please monitor flank pain for improvement.   Please establish care with [facility] for concerns of diabetes.

## 2017-09-03 NOTE — ED Provider Notes (Signed)
[facility]    CSN: [id number] Arrival date & time: 09/03/17  1006     History   Chief Complaint Chief Complaint  Patient presents with  . Headache  . Flank Pain    HPI Arabic translation via [employer] Lisa [name] is a 37 y.o. female presenting with with 2 days of increased right flank pain and headache.   She gets frequent headaches, that are painful in bitemporal areas. Typically takes ibuprofen with relief. Denies photophobia, vision changes, nausea, vomiting, weakness. Headache today is similar to her typical headache- not any more severe than normal.   Her right flank pain she notices worse at night. States pain comes and goes. History of kidney stone 5 years ago. Feels this is exactly how she felt before. Denies dysuria. Does endorse increased frequency at night and a darker color. No abdominal pain.   HPI  Past Medical History:  Diagnosis Date  . Diabetes mellitus without complication (HCC)    type 1    Patient Active Problem List   Diagnosis Date Noted  . Supervision of high-risk pregnancy 08/15/2012  . Short cervix, antepartum 08/08/2012  . Twin gestation, dichorionic diamniotic 07/18/2012  . Gestational diabetes mellitus, antepartum 07/18/2012    History reviewed. No pertinent surgical history.  OB History    Gravida Para Term Preterm AB Living   1 1 0 1 0 2   SAB TAB Ectopic Multiple Live Births   0 0 0 1 2       Home Medications    Prior to Admission medications   Medication Sig Start Date End Date Taking? Authorizing Provider  tamsulosin (FLOMAX) 0.4 MG CAPS capsule Take 1 capsule (0.4 mg total) by mouth daily for 10 days. 09/03/17 09/13/17  Mayme Profeta, Junius CreamerHallie C, PA-C    Family History Family History  Problem Relation Age of Onset  . Diabetes Maternal Grandmother   . Diabetes Maternal Grandfather   . Other Neg Hx     Social History Social History   Tobacco Use  . Smoking status: Never Smoker  Substance Use Topics  . Alcohol use:  No  . Drug use: No     Allergies   Patient has no known allergies.   Review of Systems Review of Systems  Constitutional: Negative for fatigue and fever.  Eyes: Negative for photophobia.  Respiratory: Negative for shortness of breath.   Cardiovascular: Negative for chest pain.  Gastrointestinal: Negative for abdominal pain, diarrhea, nausea and vomiting.  Genitourinary: Positive for flank pain and frequency. Negative for dysuria and hematuria.  Musculoskeletal: Positive for back pain. Negative for myalgias.  Neurological: Positive for headaches. Negative for dizziness, weakness, light-headedness and numbness.     Physical Exam Triage Vital Signs ED Triage Vitals  Enc Vitals Group     BP 09/03/17 1057 124/86     Pulse Rate 09/03/17 1057 72     Resp 09/03/17 1057 16     Temp 09/03/17 1057 98.3 F (36.8 C)     Temp Source 09/03/17 1057 Oral     SpO2 09/03/17 1057 100 %     Weight 09/03/17 1052 214 lb (97.1 kg)     Height --      Head Circumference --      Peak Flow --      Pain Score 09/03/17 1053 8     Pain Loc --      Pain Edu? --      Excl. in GC? --    No data  found.  Updated Vital Signs BP 124/86   Pulse 72   Temp 98.3 F (36.8 C) (Oral)   Resp 16   Wt 214 lb (97.1 kg)   LMP 08/06/2017   SpO2 100%   BMI 33.52 kg/m    Physical Exam  Constitutional: She is oriented to person, place, and time. She appears well-developed and well-nourished. No distress.  HENT:  Head: Normocephalic and atraumatic.  Eyes: Conjunctivae and EOM are normal. Pupils are equal, round, and reactive to light.  Neck: Neck supple.  Cardiovascular: Normal rate and regular rhythm.  No murmur heard. Pulmonary/Chest: Effort normal and breath sounds normal. No respiratory distress.  Abdominal: Soft. There is no tenderness.  Negative CVA tenderness, mild tenderness to palpation of right flank  Musculoskeletal: She exhibits no edema.  Neurological: She is alert and oriented to person,  place, and time. No cranial nerve deficit or sensory deficit. Coordination and gait normal.  Skin: Skin is warm and dry.  Psychiatric: She has a normal mood and affect.  Nursing note and vitals reviewed.    UC Treatments / Results  Labs (all labs ordered are listed, but only abnormal results are displayed) Labs Reviewed  POCT URINALYSIS DIP (DEVICE)    EKG  EKG Interpretation None       Radiology No results found.  Procedures Procedures (including critical care time)  Medications Ordered in UC Medications  ketorolac (TORADOL) injection 60 mg (60 mg Intramuscular Given 09/03/17 1136)     Initial Impression / Assessment and Plan / UC Course  I have reviewed the triage vital signs and the nursing notes.  Pertinent labs & imaging results that were available during my care of the patient were reviewed by me and considered in my medical decision making (see chart for details).     Flank pain possibly from kidney stone vs. Muscular strain. Given history and hemoglobin on UA will treat as kidney stone. Toradol injection in clinic for pain and headache. flomax sent home for 10 days.  Headache does not appear to be worse than her typical headaches, no associated visual changes or vision symptoms. No nausea/vomiting. No weakness or speech difficulty, no history of high blood pressure. Continue Ibuprofen for HA, follow up with PCP.  Discussed strict return precautions. Patient verbalized understanding and is agreeable with plan.  Provided info for [place] and wellness as patient has no insurance.  Final Clinical Impressions(s) / UC Diagnoses   Final diagnoses:  Right flank pain  Tension-type headache, not intractable, unspecified chronicity pattern    ED Discharge Orders        Ordered    tamsulosin (FLOMAX) 0.4 MG CAPS capsule  Daily     09/03/17 1148       Controlled Substance Prescriptions [place] Controlled Substance Registry consulted? Not Applicable   Lew Dawes, PA-C 09/03/17 1342    Lawsyn Heiler, [place] C, [place] 09/03/17 1343

## 2017-09-03 NOTE — ED Triage Notes (Signed)
PT reports headache and flank pain for 2 days. PT reports urine color is darker and denies burning.

## 2017-11-18 ENCOUNTER — Emergency Department (HOSPITAL_COMMUNITY)
Admission: EM | Admit: 2017-11-18 | Discharge: 2017-11-18 | Disposition: A | Discharge status: Home / Self Care | Payer: BLUE CROSS/BLUE SHIELD | Attending: Emergency Medicine | Admitting: Emergency Medicine

## 2017-11-18 ENCOUNTER — Encounter (HOSPITAL_COMMUNITY): Payer: Self-pay | Admitting: Emergency Medicine

## 2017-11-18 ENCOUNTER — Emergency Department (HOSPITAL_COMMUNITY): Payer: BLUE CROSS/BLUE SHIELD

## 2017-11-18 DIAGNOSIS — R300 Dysuria: Secondary | ICD-10-CM | POA: Diagnosis not present

## 2017-11-18 DIAGNOSIS — E109 Type 1 diabetes mellitus without complications: Secondary | ICD-10-CM | POA: Insufficient documentation

## 2017-11-18 DIAGNOSIS — Z87442 Personal history of urinary calculi: Secondary | ICD-10-CM | POA: Insufficient documentation

## 2017-11-18 DIAGNOSIS — R109 Unspecified abdominal pain: Secondary | ICD-10-CM

## 2017-11-18 DIAGNOSIS — R1031 Right lower quadrant pain: Secondary | ICD-10-CM | POA: Diagnosis not present

## 2017-11-18 LAB — URINALYSIS, ROUTINE W REFLEX MICROSCOPIC
BILIRUBIN URINE: NEGATIVE
Glucose, UA: NEGATIVE mg/dL
KETONES UR: 5 mg/dL — AB
Leukocytes, UA: NEGATIVE
Nitrite: NEGATIVE
PROTEIN: 30 mg/dL — AB
Specific Gravity, Urine: 1.027 (ref 1.005–1.030)
pH: 5 (ref 5.0–8.0)

## 2017-11-18 LAB — POC URINE PREG, ED: Preg Test, Ur: NEGATIVE

## 2017-11-18 MED ORDER — KETOROLAC TROMETHAMINE 30 MG/ML IJ SOLN
30.0000 mg | Freq: Once | INTRAMUSCULAR | Status: AC
Start: 2017-11-18 — End: 2017-11-18
  Administered 2017-11-18: 30 mg via INTRAVENOUS
  Filled 2017-11-18: qty 1

## 2017-11-18 MED ORDER — ONDANSETRON HCL 4 MG/2ML IJ SOLN
4.0000 mg | Freq: Once | INTRAMUSCULAR | Status: AC
  Administered 2017-11-18: 4 mg via INTRAVENOUS
  Filled 2017-11-18: qty 2

## 2017-11-18 MED ORDER — HYDROCODONE-ACETAMINOPHEN 5-325 MG PO TABS: 1.0000 | ORAL_TABLET | ORAL | 0 refills | Status: DC | PRN

## 2017-11-18 MED ORDER — SODIUM CHLORIDE 0.9 % IV BOLUS (SEPSIS)
1000.0000 mL | Freq: Once | INTRAVENOUS | Status: AC
  Administered 2017-11-18: 1000 mL via INTRAVENOUS

## 2017-11-18 MED ORDER — MORPHINE SULFATE (PF) 4 MG/ML IV SOLN
4.0000 mg | Freq: Once | INTRAVENOUS | Status: AC
  Administered 2017-11-18: 4 mg via INTRAVENOUS
  Filled 2017-11-18: qty 1

## 2017-11-18 NOTE — ED Triage Notes (Signed)
Pt c/o right flank pain with dysuria x week. Family reports pt has Hx kidney stones

## 2017-11-18 NOTE — ED Provider Notes (Signed)
[facility]-EMERGENCY DEPT Provider Note   CSN: [id number] Arrival date & time: 11/18/17  1512     History   Chief Complaint Chief Complaint  Patient presents with  . Flank Pain  . Dysuria    HPI Lisa [name] is a 37 y.o. female.  Patient presents with right flank pain with dysuria for 1 week.  She has known history of kidney stones.  No vaginal bleeding, vaginal discharge, fever, sweats, chills, hematuria.  Past medical history includes diabetes.  Severity of pain is mild.  Nothing makes symptoms better or worse.     Past Medical History:  Diagnosis Date  . Diabetes mellitus without complication (HCC)    type 1    Patient Active Problem List   Diagnosis Date Noted  . Supervision of high-risk pregnancy 08/15/2012  . Short cervix, antepartum 08/08/2012  . Twin gestation, dichorionic diamniotic 07/18/2012  . Gestational diabetes mellitus, antepartum 07/18/2012    History reviewed. No pertinent surgical history.  OB History    Gravida  1   Para  1   Term  0   Preterm  1   AB  0   Living  2     SAB  0   TAB  0   Ectopic  0   Multiple  1   Live Births  2            Home Medications    Prior to Admission medications   Medication Sig Start Date End Date Taking? Authorizing Provider  acetaminophen (TYLENOL) 500 MG tablet Take 1,000 mg by mouth daily as needed for moderate pain.   Yes [provider]  amoxicillin-clavulanate (AUGMENTIN) 875-125 MG tablet Take 1 tablet by mouth 2 (two) times daily. 11/16/17  Yes [provider]  ranitidine (ZANTAC) 150 MG tablet Take 150 mg by mouth daily as needed for heartburn.   Yes [provider]  HYDROcodone-acetaminophen (NORCO/VICODIN) 5-325 MG tablet Take 1 tablet by mouth every 4 (four) hours as needed. 11/18/17   Donnetta Hutchingook, Laurin Paulo, MD    Family History Family History  Problem Relation Age of Onset  . Diabetes Maternal Grandmother   . Diabetes Maternal  Grandfather   . Other Neg Hx     Social History Social History   Tobacco Use  . Smoking status: Never Smoker  . Smokeless tobacco: Never Used  Substance Use Topics  . Alcohol use: No  . Drug use: No     Allergies   Patient has no known allergies.   Review of Systems Review of Systems  All other systems reviewed and are negative.    Physical Exam Updated Vital Signs BP 127/90 (BP Location: Left Arm)   Pulse 74   Temp 98.2 F (36.8 C) (Oral)   Resp 18   SpO2 96%   Physical Exam  Constitutional: She is oriented to person, place, and time. She appears well-developed and well-nourished.  nad  HENT:  Head: Normocephalic and atraumatic.  Eyes: Conjunctivae are normal.  Neck: Neck supple.  Cardiovascular: Normal rate and regular rhythm.  Pulmonary/Chest: Effort normal and breath sounds normal.  Abdominal: Soft. Bowel sounds are normal.  Genitourinary:  Genitourinary Comments: Minimal right flank tenderness.  Musculoskeletal: Normal range of motion.  Neurological: She is alert and oriented to person, place, and time.  Skin: Skin is warm and dry.  Psychiatric: She has a normal mood and affect. Her behavior is normal.  Nursing note and vitals reviewed.    ED Treatments /  Results  Labs (all labs ordered are listed, but only abnormal results are displayed) Labs Reviewed  URINALYSIS, ROUTINE W REFLEX MICROSCOPIC - Abnormal; Notable for the following components:      Result Value   APPearance HAZY (*)    Hgb urine dipstick MODERATE (*)    Ketones, ur 5 (*)    Protein, ur 30 (*)    Bacteria, UA RARE (*)    Squamous Epithelial / LPF 0-5 (*)    All other components within normal limits  POC URINE PREG, ED    EKG  EKG Interpretation None       Radiology Ct Renal Stone Study  Result Date: 11/18/2017 CLINICAL DATA:  Right flank pain and dysuria for the past week. EXAM: CT ABDOMEN AND PELVIS WITHOUT CONTRAST TECHNIQUE: Multidetector CT imaging of the abdomen  and pelvis was performed following the standard protocol without IV contrast. COMPARISON:  CT abdomen pelvis dated September 22, 2012. FINDINGS: Lower chest: Bibasilar atelectasis. Hepatobiliary: Diffuse hepatic steatosis. Tiny gallstone. No gallbladder wall thickening or biliary dilatation. Pancreas: Unremarkable. No pancreatic ductal dilatation or surrounding inflammatory changes. Spleen: Normal in size without focal abnormality. Adrenals/Urinary Tract: Adrenal glands are unremarkable. Kidneys are normal, without renal calculi, focal lesion, or hydronephrosis. Bladder is unremarkable. Stomach/Bowel: Stomach is within normal limits. Appendix appears normal. No evidence of bowel wall thickening, distention, or inflammatory changes. Vascular/Lymphatic: No significant vascular findings are present. No enlarged abdominal or pelvic lymph nodes. Reproductive: Small calcified uterine fibroid.  No adnexal mass. Other: Tiny fat containing umbilical hernia. No free fluid or pneumoperitoneum. Musculoskeletal: No acute or significant osseous findings. Degenerative changes of the pubic symphysis. IMPRESSION: 1.  No acute intra-abdominal process.  No renal or ureteral calculi. 2. Diffuse hepatic steatosis. 3. Cholelithiasis. 4. Fibroid uterus. Electronically Signed   By: Obie Dredge M.D.   On: 11/18/2017 19:13    Procedures Procedures (including critical care time)  Medications Ordered in ED Medications  ondansetron (ZOFRAN) injection 4 mg (4 mg Intravenous Given 11/18/17 1826)  ketorolac (TORADOL) 30 MG/ML injection 30 mg (30 mg Intravenous Given 11/18/17 1826)  sodium chloride 0.9 % bolus 1,000 mL (1,000 mLs Intravenous New Bag/Given 11/18/17 1826)  morphine 4 MG/ML injection 4 mg (4 mg Intravenous Given 11/18/17 1826)     Initial Impression / Assessment and Plan / ED Course  I have reviewed the triage vital signs and the nursing notes.  Pertinent labs & imaging results that were available during my care of the  patient were reviewed by me and considered in my medical decision making (see chart for details).     Patient has normal vital signs.  Urinalysis shows hemoglobin but no infection.  Pregnancy negative.  CT scan shows cholelithiasis, fatty liver, uterine fibroids; but no kidney stones.  I offered her an ultrasound of her abdomen, but patient deferred this test.  She understands to return for fever, chills, worsening pain, any concerns.  Final Clinical Impressions(s) / ED Diagnoses   Final diagnoses:  Right flank pain    ED Discharge Orders        Ordered    HYDROcodone-acetaminophen (NORCO/VICODIN) 5-325 MG tablet  Every 4 hours PRN     11/18/17 2057       Donnetta Hutching, MD 11/18/17 2103

## 2017-11-18 NOTE — Discharge Instructions (Addendum)
Pregnancy test was negative.  Urinalysis showed some blood, but no infection.  CT scan showed gallstones, uterine fibroids, fatty liver, but no kidney stones or evidence of appendicitis.  Return for fever, chills, painful urination, worsening pain, any concerns.  Follow-up with your primary care doctor.

## 2018-01-30 ENCOUNTER — Encounter (HOSPITAL_COMMUNITY): Payer: Self-pay | Admitting: *Deleted

## 2018-01-30 ENCOUNTER — Emergency Department (HOSPITAL_COMMUNITY): Payer: No Typology Code available for payment source

## 2018-01-30 ENCOUNTER — Emergency Department (HOSPITAL_COMMUNITY)
Admission: EM | Admit: 2018-01-30 | Discharge: 2018-01-30 | Disposition: A | Discharge status: Home / Self Care | Payer: No Typology Code available for payment source | Attending: Emergency Medicine | Admitting: Emergency Medicine

## 2018-01-30 DIAGNOSIS — Y939 Activity, unspecified: Secondary | ICD-10-CM | POA: Insufficient documentation

## 2018-01-30 DIAGNOSIS — Y999 Unspecified external cause status: Secondary | ICD-10-CM | POA: Insufficient documentation

## 2018-01-30 DIAGNOSIS — E119 Type 2 diabetes mellitus without complications: Secondary | ICD-10-CM | POA: Insufficient documentation

## 2018-01-30 DIAGNOSIS — S301XXA Contusion of abdominal wall, initial encounter: Secondary | ICD-10-CM | POA: Diagnosis not present

## 2018-01-30 DIAGNOSIS — Z79899 Other long term (current) drug therapy: Secondary | ICD-10-CM | POA: Insufficient documentation

## 2018-01-30 DIAGNOSIS — Y929 Unspecified place or not applicable: Secondary | ICD-10-CM | POA: Insufficient documentation

## 2018-01-30 DIAGNOSIS — S3991XA Unspecified injury of abdomen, initial encounter: Secondary | ICD-10-CM | POA: Diagnosis present

## 2018-01-30 LAB — BASIC METABOLIC PANEL
ANION GAP: 11 (ref 5–15)
BUN: 9 mg/dL (ref 6–20)
CO2: 22 mmol/L (ref 22–32)
Calcium: 9.4 mg/dL (ref 8.9–10.3)
Chloride: 105 mmol/L (ref 101–111)
Creatinine, Ser: 0.55 mg/dL (ref 0.44–1.00)
GLUCOSE: 111 mg/dL — AB (ref 65–99)
POTASSIUM: 3.9 mmol/L (ref 3.5–5.1)
Sodium: 138 mmol/L (ref 135–145)

## 2018-01-30 LAB — I-STAT BETA HCG BLOOD, ED (MC, WL, AP ONLY): I-stat hCG, quantitative: <5 m[IU]/mL (ref <5)

## 2018-01-30 MED ORDER — IOPAMIDOL (ISOVUE-300) INJECTION 61%
INTRAVENOUS | Status: AC
  Filled 2018-01-30: qty 100

## 2018-01-30 MED ORDER — IOPAMIDOL (ISOVUE-300) INJECTION 61%
100.0000 mL | Freq: Once | INTRAVENOUS | Status: AC | PRN
  Administered 2018-01-30: 100 mL via INTRAVENOUS

## 2018-01-30 MED ORDER — ACETAMINOPHEN 500 MG PO TABS
1000.0000 mg | ORAL_TABLET | Freq: Once | ORAL | Status: DC
  Filled 2018-01-30: qty 2
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
## 2018-01-30 NOTE — Discharge Instructions (Signed)
It was our pleasure to provide your ER care today - we hope that you feel better.  Rest. Drink plenty of fluids.   Take acetaminophen and/or ibuprofen as need for pain.  Return to ER if worse, new symptoms, new or severe pain, other concern.
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
## 2018-01-30 NOTE — ED Notes (Signed)
Bed: WA16 Expected date:  Expected time:  Means of arrival:  Comments: 

## 2018-02-19 ENCOUNTER — Other Ambulatory Visit: Payer: Self-pay

## 2018-02-19 ENCOUNTER — Ambulatory Visit (HOSPITAL_COMMUNITY)
Admission: EM | Admit: 2018-02-19 | Discharge: 2018-02-19 | Disposition: A | Discharge status: Home / Self Care | Payer: No Typology Code available for payment source | Attending: Internal Medicine | Admitting: Internal Medicine

## 2018-02-19 ENCOUNTER — Encounter (HOSPITAL_COMMUNITY): Payer: Self-pay | Admitting: Emergency Medicine

## 2018-02-19 DIAGNOSIS — L299 Pruritus, unspecified: Secondary | ICD-10-CM

## 2018-02-19 DIAGNOSIS — H6123 Impacted cerumen, bilateral: Secondary | ICD-10-CM

## 2018-02-19 NOTE — ED Provider Notes (Signed)
[facility]    CSN: [id number] Arrival date & time: 02/19/18  1202     History   Chief Complaint Chief Complaint  Patient presents with  . Otalgia  . Rash    HPI Lisa [name] is a 37 y.o. female.   Subjective:   Lisa [name] is a 37 y.o. female who presents for possible ear infection. Symptoms include bilateral ear pain and decreased hearing. Onset of symptoms was 1 week ago and has been unchanged since that time. Patient denies any associated symptoms such as tinnitus, congestion, cough, nasal discharge or sore throat.  She reports that she has a history of cerumen buildup which usually requires irrigation every 6 months or so.   Patient also presents for evaluation of itching located under bilateral breast. She denies any rash or any other symptoms. Patient has not tried anything for her symptoms. Patient has not had new exposures.  Please note that patient's primary language is Arabic. Translation provided by Tarek #[id number]   The following portions of the patient's history were reviewed and updated as appropriate: allergies, current medications, past family history, past medical history, past social history, past surgical history and problem list.        Past Medical History:  Diagnosis Date  . Diabetes mellitus without complication (HCC)    type 1    Patient Active Problem List   Diagnosis Date Noted  . Supervision of high-risk pregnancy 08/15/2012  . Short cervix, antepartum 08/08/2012  . Twin gestation, dichorionic diamniotic 07/18/2012  . Gestational diabetes mellitus, antepartum 07/18/2012    History reviewed. No pertinent surgical history.  OB History    Gravida  1   Para  1   Term  0   Preterm  1   AB  0   Living  2     SAB  0   TAB  0   Ectopic  0   Multiple  1   Live Births  2            Home Medications    Prior to Admission medications   Not on File    Family History Family History  Problem Relation Age  of Onset  . Diabetes Maternal Grandmother   . Diabetes Maternal Grandfather   . Other Neg Hx     Social History Social History   Tobacco Use  . Smoking status: Never Smoker  . Smokeless tobacco: Never Used  Substance Use Topics  . Alcohol use: No  . Drug use: No     Allergies   Patient has no known allergies.   Review of Systems Review of Systems  Constitutional: Negative for fever.  HENT: Positive for ear pain and hearing loss. Negative for congestion, ear discharge, rhinorrhea, sore throat and tinnitus.   Skin: Negative for rash.       Itching   All other systems reviewed and are negative.    Physical Exam Triage Vital Signs ED Triage Vitals  Enc Vitals Group     BP 02/19/18 1218 111/77     Pulse Rate 02/19/18 1218 81     Resp 02/19/18 1218 16     Temp 02/19/18 1218 97.9 F (36.6 C)     Temp Source 02/19/18 1218 Oral     SpO2 02/19/18 1218 99 %     Weight --      Height --      Head Circumference --      Peak Flow --  Pain Score 02/19/18 1220 0     Pain Loc --      Pain Edu? --      Excl. in GC? --    No data found.  Updated Vital Signs BP 111/77 (BP Location: Left Arm)   Pulse 81   Temp 97.9 F (36.6 C) (Oral)   Resp 16   SpO2 99%   Visual Acuity Right Eye Distance:   Left Eye Distance:   Bilateral Distance:    Right Eye Near:   Left Eye Near:    Bilateral Near:     Physical Exam  Constitutional: She is oriented to person, place, and time. She appears well-developed and well-nourished.  HENT:  Head: Normocephalic.  Right Ear: External ear normal. No tenderness. Decreased hearing is noted.  Left Ear: External ear normal. No tenderness. Decreased hearing is noted.  Unable to visualize bilateral TM due to cerumen impaction   Neck: Normal range of motion. Neck supple.  Cardiovascular: Normal rate and regular rhythm.  Pulmonary/Chest: Effort normal and breath sounds normal.  Musculoskeletal: Normal range of motion.  Neurological: She  is alert and oriented to person, place, and time.  Skin: Skin is warm and dry. No rash noted.  Psychiatric: She has a normal mood and affect.     UC Treatments / Results  Labs (all labs ordered are listed, but only abnormal results are displayed) Labs Reviewed - No data to display  EKG None  Radiology No results found.  Procedures Ear Cerumen Removal Date/Time: 02/19/2018 2:04 PM Performed by: Kallie Locks, RT Authorized by: Lurline Idol, FNP   Consent:    Consent obtained:  Verbal   Consent given by:  Patient   Risks discussed:  Bleeding, infection, pain, dizziness, incomplete removal and TM perforation   Alternatives discussed:  No treatment Procedure details:    Location:  L ear and R ear   Procedure type: irrigation   Post-procedure details:    Inspection:  TM intact   Hearing quality:  Improved   Patient tolerance of procedure:  Tolerated well, no immediate complications   (including critical care time)  Medications Ordered in UC Medications - No data to display  Initial Impression / Assessment and Plan / UC Course  I have reviewed the triage vital signs and the nursing notes.  Pertinent labs & imaging results that were available during my care of the patient were reviewed by me and considered in my medical decision making (see chart for details).     37 year old female presented with bilateral decreased hearing and bilateral ear pain.  Patient underwent bilateral cerumen removal with remarkable improvement in symptoms.  She also complains of itching underneath her breasts.  Exam reveals no rash or skin breakdown.  She is advised to use hydrocortisone cream and Benadryl as needed for itching as well as keeping the area underneath her breasts dry.  Today's evaluation has revealed no signs of a dangerous process. Discussed diagnosis with patient. Patient aware of their diagnosis, possible red flag symptoms to watch out for and need for close follow up.  Patient understands verbal and written discharge instructions. Patient comfortable with plan and disposition.  Patient has a clear mental status at this time, good insight into illness (after discussion and teaching) and has clear judgment to make decisions regarding their care.  Documentation was completed with the aid of voice recognition software. Transcription may contain typographical errors.  Final Clinical Impressions(s) / UC Diagnoses   Final diagnoses:  Bilateral  impacted cerumen  Itching   Discharge Instructions   None    ED Prescriptions    None     Controlled Substance Prescriptions [place] Controlled Substance Registry consulted? Not Applicable   Lurline Idol, FNP 02/19/18 1406

## 2018-02-19 NOTE — ED Triage Notes (Addendum)
[facility] with a complaint of bilateral ear pain x 2 days. The patient also complained of a rash under both breasts.

## 2018-02-25 ENCOUNTER — Encounter (HOSPITAL_COMMUNITY): Payer: Self-pay | Admitting: Emergency Medicine

## 2018-02-25 ENCOUNTER — Ambulatory Visit (HOSPITAL_COMMUNITY)
Admission: EM | Admit: 2018-02-25 | Discharge: 2018-02-25 | Disposition: A | Discharge status: Home / Self Care | Payer: Self-pay | Attending: Family Medicine | Admitting: Family Medicine

## 2018-02-25 DIAGNOSIS — Z760 Encounter for issue of repeat prescription: Secondary | ICD-10-CM

## 2018-02-25 DIAGNOSIS — L308 Other specified dermatitis: Secondary | ICD-10-CM

## 2018-02-25 DIAGNOSIS — E119 Type 2 diabetes mellitus without complications: Secondary | ICD-10-CM

## 2018-02-25 MED ORDER — TRIAMCINOLONE ACETONIDE 0.1 % EX CREA: 1.0000 "application " | TOPICAL_CREAM | Freq: Two times a day (BID) | CUTANEOUS | 0 refills | Status: DC

## 2018-02-25 MED ORDER — METFORMIN HCL 500 MG PO TABS: 500.0000 mg | ORAL_TABLET | Freq: Two times a day (BID) | ORAL | 1 refills | Status: DC

## 2018-02-25 MED ORDER — CETIRIZINE HCL 10 MG PO CHEW: 10.0000 mg | CHEWABLE_TABLET | Freq: Every day | ORAL | 0 refills | Status: DC

## 2018-02-25 NOTE — ED Provider Notes (Signed)
[facility]   [id number] 02/25/18 Arrival Time: 1216  SUBJECTIVE:  Lisa [name] is a 37 y.o. female who presents with a left breast rash that began 3 months and has gotten worse.  Denies changes in soaps, detergents, or anyone with similar symptoms.  Denies known trigger, environmental exposure or allergies, or recent travel.  Localizes the rash to left breast.  Describes it as itchy.  Has NOT tried OTC medications  Denies aggravating factors.  Denies similar symptoms in the past.   Denies fever, chills, nausea, vomiting, erythema, redness, swollen glands, SOB, chest pain, abdominal pain, changes in bowel or bladder function.    Patient also requesting medication refill for DM.    ROS: As per HPI.  Past Medical History:  Diagnosis Date  . Diabetes mellitus without complication (HCC)    type 1   History reviewed. No pertinent surgical history. No Known Allergies No current facility-administered medications on file prior to encounter.    No current outpatient medications on file prior to encounter.   Social History   Socioeconomic History  . Marital status: Married    Spouse name: Not on file  . Number of children: Not on file  . Years of education: Not on file  . Highest education level: Not on file  Occupational History  . Not on file  Social Needs  . Financial resource strain: Not on file  . Food insecurity:    Worry: Not on file    Inability: Not on file  . Transportation needs:    Medical: Not on file    Non-medical: Not on file  Tobacco Use  . Smoking status: Never Smoker  . Smokeless tobacco: Never Used  Substance and Sexual Activity  . Alcohol use: No  . Drug use: No  . Sexual activity: Not Currently    Birth control/protection: None  Lifestyle  . Physical activity:    Days per week: Not on file    Minutes per session: Not on file  . Stress: Not on file  Relationships  . Social connections:    Talks on phone: Not on file    Gets together: Not  on file    Attends religious service: Not on file    Active member of club or organization: Not on file    Attends meetings of clubs or organizations: Not on file    Relationship status: Not on file  . Intimate partner violence:    Fear of current or ex partner: Not on file    Emotionally abused: Not on file    Physically abused: Not on file    Forced sexual activity: Not on file  Other Topics Concern  . Not on file  Social History Narrative  . Not on file   Family History  Problem Relation Age of Onset  . Diabetes Maternal Grandmother   . Diabetes Maternal Grandfather   . Other Neg Hx     OBJECTIVE: Vitals:   02/25/18 1231  BP: 121/84  Pulse: 74  Resp: 16  Temp: (!) 96.9 F (36.1 C)  TempSrc: Oral  SpO2: 99%    General appearance: alert; no distress Lungs: clear to auscultation bilaterally Heart: regular rate and rhythm.  Radial pulse 2+ bilaterally Extremities: no edema Skin: warm and dry; 2 dime-sized hyperpigmented macules localized at the 3 and 5 o'clock position of the left breast; no obvious erythema or active drainage Psychological: alert and cooperative; normal mood and affect  ASSESSMENT & PLAN:  1. Other eczema  2. Type 2 diabetes mellitus without complication, without long-term current use of insulin (HCC)   3. Medication refill     Meds ordered this encounter  Medications  . metFORMIN (GLUCOPHAGE) 500 MG tablet    Sig: Take 1 tablet (500 mg total) by mouth 2 (two) times daily with a meal.    Dispense:  30 tablet    Refill:  1    Order Specific Question:   Supervising Provider    Answer:   Isa Rankin [phone]  . triamcinolone cream (KENALOG) 0.1 %    Sig: Apply 1 application topically 2 (two) times daily.    Dispense:  30 g    Refill:  0    Order Specific Question:   Supervising Provider    Answer:   Isa Rankin [phone]  . cetirizine (ZYRTEC) 10 MG chewable tablet    Sig: Chew 1 tablet (10 mg total) by mouth daily.     Dispense:  20 tablet    Refill:  0    Order Specific Question:   Supervising Provider    Answer:   Isa Rankin [[id number]]   Prescribed zyrtec take daily for itching Prescribed triamcinolone 0.1% cream for itching and inflammation Avoid hot showers/ baths Moisturize skin daily  Follow up with PCP if symptoms persists Return or go to the ER if you have any new or worsening symptoms  Metformin prescription refilled Follow up with PCP for further evaluation and management  Reviewed expectations re: course of current medical issues. Questions answered. Outlined signs and symptoms indicating need for more acute intervention. Patient verbalized understanding. After Visit Summary given.   Rennis Harding, PA-C 02/25/18 1320

## 2018-02-25 NOTE — ED Triage Notes (Signed)
Pt also requesting refill on metformin.

## 2018-02-25 NOTE — Discharge Instructions (Signed)
Prescribed zyrtec take daily for itching Prescribed triamcinolone 0.1% for itching and inflammation Avoid hot showers/ baths Moisturize skin daily  Follow up with PCP if symptoms persists Return or go to the ER if you have any new or worsening symptoms  Metformin prescription refilled Follow up with PCP for further evaluation and management

## 2018-02-25 NOTE — ED Triage Notes (Signed)
Pt c/o breast itchiness, has two spots on her L breast that are painful.

## 2018-03-19 ENCOUNTER — Encounter (HOSPITAL_COMMUNITY): Payer: Self-pay | Admitting: *Deleted

## 2018-03-19 ENCOUNTER — Ambulatory Visit (HOSPITAL_COMMUNITY)
Admission: EM | Admit: 2018-03-19 | Discharge: 2018-03-19 | Disposition: A | Discharge status: Home / Self Care | Payer: Self-pay | Attending: Family Medicine | Admitting: Family Medicine

## 2018-03-19 ENCOUNTER — Ambulatory Visit (INDEPENDENT_AMBULATORY_CARE_PROVIDER_SITE_OTHER): Payer: Self-pay

## 2018-03-19 DIAGNOSIS — S93492A Sprain of other ligament of left ankle, initial encounter: Secondary | ICD-10-CM

## 2018-03-19 DIAGNOSIS — R2242 Localized swelling, mass and lump, left lower limb: Secondary | ICD-10-CM

## 2018-03-19 DIAGNOSIS — L2089 Other atopic dermatitis: Secondary | ICD-10-CM

## 2018-03-19 DIAGNOSIS — M25572 Pain in left ankle and joints of left foot: Secondary | ICD-10-CM

## 2018-03-19 MED ORDER — NAPROXEN 375 MG PO TABS: 375.0000 mg | ORAL_TABLET | Freq: Two times a day (BID) | ORAL | 0 refills | Status: DC

## 2018-03-19 MED ORDER — TRIAMCINOLONE ACETONIDE 0.5 % EX OINT: 1.0000 "application " | TOPICAL_OINTMENT | Freq: Two times a day (BID) | CUTANEOUS | 0 refills | Status: DC

## 2018-03-19 NOTE — ED Provider Notes (Signed)
[facility]   [id number] 03/19/18 Arrival Time: 1259  SUBJECTIVE: History from: patient. Keiandra Justice is a 37 y.o. female complains of left ankle pain that began 1 day ago.  Began after she fell down two steps.  Localizes the pain to the medial and lateral ankle  Describes the pain as constant and 4/10.  Has tried OTC medications with temporary relief.  Symptoms are made worse with weight-bearing and ROM about the ankle.  Denies similar symptoms in the past.  Complains of swelling.  Denies fever, chills, erythema, ecchymosis, weakness, numbness and tingling.      Patient also mentions eczema on her breast.  Was seen on 02/25/18 for this problem and treated with triamcinolone.  Reports itching.  Requests different cream.    ROS: As per HPI.  Past Medical History:  Diagnosis Date  . Diabetes mellitus without complication (HCC)    type 1   History reviewed. No pertinent surgical history. No Known Allergies No current facility-administered medications on file prior to encounter.    Current Outpatient Medications on File Prior to Encounter  Medication Sig Dispense Refill  . cetirizine (ZYRTEC) 10 MG chewable tablet Chew 1 tablet (10 mg total) by mouth daily. 20 tablet 0  . metFORMIN (GLUCOPHAGE) 500 MG tablet Take 1 tablet (500 mg total) by mouth 2 (two) times daily with a meal. 30 tablet 1   Social History   Socioeconomic History  . Marital status: Married    Spouse name: Not on file  . Number of children: Not on file  . Years of education: Not on file  . Highest education level: Not on file  Occupational History  . Not on file  Social Needs  . Financial resource strain: Not on file  . Food insecurity:    Worry: Not on file    Inability: Not on file  . Transportation needs:    Medical: Not on file    Non-medical: Not on file  Tobacco Use  . Smoking status: Never Smoker  . Smokeless tobacco: Never Used  Substance and Sexual Activity  . Alcohol use: No  . Drug  use: No  . Sexual activity: Not on file  Lifestyle  . Physical activity:    Days per week: Not on file    Minutes per session: Not on file  . Stress: Not on file  Relationships  . Social connections:    Talks on phone: Not on file    Gets together: Not on file    Attends religious service: Not on file    Active member of club or organization: Not on file    Attends meetings of clubs or organizations: Not on file    Relationship status: Not on file  . Intimate partner violence:    Fear of current or ex partner: Not on file    Emotionally abused: Not on file    Physically abused: Not on file    Forced sexual activity: Not on file  Other Topics Concern  . Not on file  Social History Narrative  . Not on file   Family History  Problem Relation Age of Onset  . Diabetes Maternal Grandmother   . Diabetes Maternal Grandfather   . Other Neg Hx     OBJECTIVE:  Vitals:   03/19/18 1342 03/19/18 1349  BP:  113/68  Pulse: 80   Resp: 16   Temp: 98.5 F (36.9 C)   TempSrc: Oral   SpO2: 98%     General  appearance: AOx3; in no acute distress.  Head: NCAT Lungs: CTA bilaterally Heart: RRR.  Clear S1 and S2 without murmur, gallops, or rubs.  Radial pulses 2+ bilaterally. Chest: 2 dime-sized hyperpigmented macules localized at the 3 o'clock position; appears unchanged since last visit Musculoskeletal: Left ankle Inspection: Skin warm, dry, clear and intact without obvious erythema, or ecchymosis. Moderate effusion localized to medial and later aspect of ankle, with some anterior ankle involvement Palpation: Tender to palpation about the medial, lateral and anterior aspect of ankle; nontender about the posterior aspect or foot.  Dorsalis pedis pulse intact ROM: FROM active and passive Strength: 5/5 knee flexion, 5/5 knee extension, 5/5 dorsiflexion, 5/5 plantar flexion Skin: warm and dry Neurologic: Antalgic gait; Sensation intact about the lower extremities Psychological: alert and  cooperative; normal mood and affect  DIAGNOSTIC STUDIES:  CLINICAL DATA: Pain following fall  EXAM: LEFT ANKLE COMPLETE - 3+ VIEW  COMPARISON: None.  FINDINGS: Frontal, oblique, and lateral views were obtained. There is generalized soft tissue swelling. There is no evident fracture or joint effusion. There is no appreciable joint space narrowing or erosion. There are small posterior and inferior calcaneal spurs. Ankle mortise appears intact.  IMPRESSION: Soft tissue swelling. No evident fracture or appreciable joint space narrowing. There are small calcaneal spurs. The ankle mortise appears intact.   Electronically Signed By: Bretta BangWilliam Woodruff III M.D. On: 03/19/2018 14:07  X-rays negative for bony abnormalities including fracture, or dislocation.  Positive soft tissue swelling.    I have reviewed the x-rays and the radiologist interpretation. I am in agreement with the radiologist interpretation.     ASSESSMENT & PLAN:  1. Sprain of other ligament of left ankle, initial encounter   2. Other atopic dermatitis     Meds ordered this encounter  Medications  . triamcinolone ointment (KENALOG) 0.5 %    Sig: Apply 1 application topically 2 (two) times daily.    Dispense:  30 g    Refill:  0    Order Specific Question:   Supervising Provider    Answer:   Isa RankinMURRAY, LAURA WILSON [phone][[id number]]  . naproxen (NAPROSYN) 375 MG tablet    Sig: Take 1 tablet (375 mg total) by mouth 2 (two) times daily.    Dispense:  20 tablet    Refill:  0    Order Specific Question:   Supervising Provider    Answer:   Isa RankinMURRAY, LAURA WILSON [[id number]]   X-rays did not show fracture or dislocation Walking boot given Continue conservative management of rest, ice, elevate  Take naproxen as needed for pain relief (may cause abdominal discomfort, ulcers, and GI bleeds avoid taking with other NSAIDs) Follow up with PCP or community health and wellness if symptoms persist Return or go to the ER if you  have any new or worsening symptoms (fever, chills, chest pain, abdominal pain, changes in bowel or bladder habits, pain radiating into lower legs, etc...)   Discontinue kenalog 0.1% cream We will try kenalog 0.5% cream. Follow up with PCP or [facility] for further evaluation and management of eczema  Reviewed expectations re: course of current medical issues. Questions answered. Outlined signs and symptoms indicating need for more acute intervention. Patient verbalized understanding. After Visit Summary given.    Rennis HardingWurst, Shauntia Levengood, PA-C 03/19/18 1648

## 2018-03-19 NOTE — Discharge Instructions (Signed)
X-rays did not show fracture or dislocation Walking boot given Continue conservative management of rest, ice, elevate  Take naproxen as needed for pain relief (may cause abdominal discomfort, ulcers, and GI bleeds avoid taking with other NSAIDs) Follow up with PCP or community health and wellness if symptoms persist Return or go to the ER if you have any new or worsening symptoms (fever, chills, chest pain, abdominal pain, changes in bowel or bladder habits, pain radiating into lower legs, etc...)   Discontinue kenalog 0.1% cream We will try kenalog 0.5% cream. Follow up with PCP or [facility] for further evaluation and management of eczema  ?????? ??????? ?? ???? ????? ?? ????? ????? ??????? ???? ????? ??????? ???????? ?????? ? ??????? ? ???? ?? ????????? ??? ?????? ?????? ????? (?? ????? ?? ??? ?????? ?? ????? ? ??????? ? ????? ?????? ?????? ???? ??????? ?? ?????? ???????? ??? ???????????? ??????) ???????? ?? PCP ?? ??? ??????? ???????? ??? ?????? ??????? ???? ??? ??? ??????? ?? ???? ???? ??? ??? ???? ?? ????? ????? ?? ????? (????? ? ??????? ? ??? ?? ????? ? ??? ???? ? ?????? ?? ????? ??????? ?? ??????? ? ??? ??? ?? ??????? ??????? ? ??? ...)  ??? kenalog 0.1 ? ???? ?????? kenalog 0.5 ? ????. ?????? ?? PCP ?? ??????? ????? ???????? ????? ?? ??????? ?????? ???????? al'ashieat alsayniat lm tazhar alkasr 'aw alkhale almashiu altamhid maein tuasil al'iidarat almuhafazat lilrrahat , waljalid , warafae khudh nabruksin hsb alhajat litakhfif al'alam (qd yatasabab fi edm alrrahat fi albatn , walqurhat , wanazif aljihaz alhadmayi tajanub tanawuliha mae mdadat alailtihab ghyr alstyrwyiydiat al'ukhraa) almutabaeat mae PCP 'aw sihat almujtamae waleafiat 'iidha aistamarat al'aerad 'arjie 'iilaa qism altawari 'aw adhhab 'iilayh 'iidha kan ladayk 'ayi 'aerad jadidat 'aw tafaqum Gladis Riffle(alhumaa , qshierirat , 'alam fi alsadr , 'alam batniun , taghayurat fi eadat al'amea' 'aw almathanat , 'alam  yashae fi alssaqin alsufliat , 'iilakh ...) waqf kenalog 0.1 % karim sanuhawil kenalog 0.5 % karim. mutabaeat mae PCP 'aw almujtamae alsihat waleafiat limazid min altaqyim wa'iidarat al'akzima

## 2018-03-19 NOTE — ED Triage Notes (Signed)
Reports falling down stairs yesterday, injuring left ankle.  C/O left medial and lateral ankle pain & swelling.  Denies any foot pain or lower leg pain with palpation.  CMS intact.

## 2018-06-09 ENCOUNTER — Ambulatory Visit (HOSPITAL_COMMUNITY)
Admission: EM | Admit: 2018-06-09 | Discharge: 2018-06-09 | Disposition: A | Discharge status: Home / Self Care | Payer: Self-pay | Attending: Family Medicine | Admitting: Family Medicine

## 2018-06-09 ENCOUNTER — Encounter (HOSPITAL_COMMUNITY): Payer: Self-pay | Admitting: *Deleted

## 2018-06-09 DIAGNOSIS — E109 Type 1 diabetes mellitus without complications: Secondary | ICD-10-CM | POA: Insufficient documentation

## 2018-06-09 DIAGNOSIS — Z7984 Long term (current) use of oral hypoglycemic drugs: Secondary | ICD-10-CM | POA: Insufficient documentation

## 2018-06-09 DIAGNOSIS — R21 Rash and other nonspecific skin eruption: Secondary | ICD-10-CM | POA: Insufficient documentation

## 2018-06-09 DIAGNOSIS — B9789 Other viral agents as the cause of diseases classified elsewhere: Secondary | ICD-10-CM

## 2018-06-09 DIAGNOSIS — Z79899 Other long term (current) drug therapy: Secondary | ICD-10-CM | POA: Insufficient documentation

## 2018-06-09 DIAGNOSIS — J029 Acute pharyngitis, unspecified: Secondary | ICD-10-CM | POA: Insufficient documentation

## 2018-06-09 DIAGNOSIS — J069 Acute upper respiratory infection, unspecified: Secondary | ICD-10-CM | POA: Insufficient documentation

## 2018-06-09 LAB — POCT RAPID STREP A: STREPTOCOCCUS, GROUP A SCREEN (DIRECT): NEGATIVE

## 2018-06-09 MED ORDER — METFORMIN HCL 500 MG PO TABS: 500.0000 mg | ORAL_TABLET | Freq: Two times a day (BID) | ORAL | 1 refills | Status: DC

## 2018-06-09 MED ORDER — BENZONATATE 100 MG PO CAPS: 100.0000 mg | ORAL_CAPSULE | Freq: Three times a day (TID) | ORAL | 0 refills | Status: DC

## 2018-06-09 MED ORDER — CLOTRIMAZOLE 1 % EX CREA: TOPICAL_CREAM | CUTANEOUS | 0 refills | Status: DC

## 2018-06-09 MED ORDER — DM-GUAIFENESIN ER 30-600 MG PO TB12: 1.0000 | ORAL_TABLET | Freq: Two times a day (BID) | ORAL | 0 refills | Status: DC

## 2018-06-09 NOTE — ED Triage Notes (Addendum)
Patient reports nasal congestion, cough and sore throat x 1 week. Denies any fevers.   Patient also reports rash to left breast, states she had the same 2 months ago.

## 2018-06-09 NOTE — Discharge Instructions (Addendum)
°  It was nice meeting you!!  I believe you have a viral upper respiratory infection. It could take 7 to 10 days for the symptoms to decrease or improve.  We will treat your cough with Tessalon Perles. mucinex for the congestion.  Ibuprofen and tylenol can help with the pain.  Chloraseptic throat spray or lozenges are another option for symptoms relief.   Warm may help Follow up as needed for worsening symptoms.    For the rash follow up with dermatology We will try a different cream to see if this helps.

## 2018-06-10 NOTE — ED Provider Notes (Signed)
[facility]    CSN: [id number] Arrival date & time: 06/09/18  1008     History   Chief Complaint Chief Complaint  Patient presents with  . Sore Throat  . Nasal Congestion  . Rash    HPI Lisa [name] is a 37 y.o. female.   Pt also complaining or persistent rash that is under the left breast. C/o itchiness with the rash. The rash has remained the same. She was seen here and treated with steroid cream without relief.  Denies any fever, joint pain. Denies any recent changes in lotions, detergents, foods or other possible irritants. No recent travel. Nobody else at home has the rash. Patient has been outside but denies any contact with plants or insects.      URI  Presenting symptoms: congestion, cough, ear pain, fever, rhinorrhea and sore throat   Severity:  Moderate Duration:  1 week Timing:  Constant Progression:  Waxing and waning Chronicity:  New Relieved by:  Nothing Worsened by:  Breathing and certain positions Ineffective treatments:  OTC medications Associated symptoms: no arthralgias, no headaches, no myalgias, no neck pain, no sinus pain, no sneezing, no swollen glands and no wheezing   Risk factors: diabetes mellitus   Risk factors: no recent illness, no recent travel and no sick contacts     Past Medical History:  Diagnosis Date  . Diabetes mellitus without complication (HCC)    type 1    Patient Active Problem List   Diagnosis Date Noted  . Supervision of high-risk pregnancy 08/15/2012  . Short cervix, antepartum 08/08/2012  . Twin gestation, dichorionic diamniotic 07/18/2012  . Gestational diabetes mellitus, antepartum 07/18/2012    History reviewed. No pertinent surgical history.  OB History    Gravida  1   Para  1   Term  0   Preterm  1   AB  0   Living  2     SAB  0   TAB  0   Ectopic  0   Multiple  1   Live Births  2            Home Medications    Prior to Admission medications   Medication Sig Start  Date End Date Taking? Authorizing Provider  benzonatate (TESSALON) 100 MG capsule Take 1 capsule (100 mg total) by mouth every 8 (eight) hours. 06/09/18   Dahlia Byes A, NP  cetirizine (ZYRTEC) 10 MG chewable tablet Chew 1 tablet (10 mg total) by mouth daily. 02/25/18   Wurst, [place], PA-C  clotrimazole (LOTRIMIN) 1 % cream Apply to affected area 2 times daily 06/09/18   Dahlia Byes A, NP  dextromethorphan-guaiFENesin (MUCINEX DM) 30-600 MG 12hr tablet Take 1 tablet by mouth 2 (two) times daily. 06/09/18   Dahlia Byes A, NP  metFORMIN (GLUCOPHAGE) 500 MG tablet Take 1 tablet (500 mg total) by mouth 2 (two) times daily with a meal. 06/09/18   Pat Elicker A, NP  naproxen (NAPROSYN) 375 MG tablet Take 1 tablet (375 mg total) by mouth 2 (two) times daily. 03/19/18   Wurst, [place], PA-C  triamcinolone ointment (KENALOG) 0.5 % Apply 1 application topically 2 (two) times daily. 03/19/18   Rennis Harding, PA-C    Family History Family History  Problem Relation Age of Onset  . Diabetes Maternal Grandmother   . Diabetes Maternal Grandfather   . Other Neg Hx     Social History Social History   Tobacco Use  . Smoking status: Never Smoker  .  Smokeless tobacco: Never Used  Substance Use Topics  . Alcohol use: No  . Drug use: No     Allergies   Patient has no known allergies.   Review of Systems Review of Systems  Constitutional: Positive for fever.  HENT: Positive for congestion, ear pain, rhinorrhea and sore throat. Negative for sinus pain and sneezing.   Respiratory: Positive for cough. Negative for wheezing.   Musculoskeletal: Negative for arthralgias, myalgias and neck pain.  Skin: Positive for rash.  Neurological: Negative for headaches.     Physical Exam Triage Vital Signs ED Triage Vitals  Enc Vitals Group     BP 06/09/18 1051 (!) 127/97     Pulse Rate 06/09/18 1051 79     Resp 06/09/18 1051 17     Temp 06/09/18 1051 98.2 F (36.8 C)     Temp Source 06/09/18 1051 Oral      SpO2 06/09/18 1051 98 %     Weight --      Height --      Head Circumference --      Peak Flow --      Pain Score 06/09/18 1049 4     Pain Loc --      Pain Edu? --      Excl. in GC? --    No data found.  Updated Vital Signs BP (!) 127/97 (BP Location: Left Arm)   Pulse 79   Temp 98.2 F (36.8 C) (Oral)   Resp 17   SpO2 98%   Visual Acuity Right Eye Distance:   Left Eye Distance:   Bilateral Distance:    Right Eye Near:   Left Eye Near:    Bilateral Near:     Physical Exam  Constitutional: She appears well-developed and well-nourished.  Very pleasant. Non toxic or ill appearing.   HENT:  Head: Normocephalic and atraumatic.  Right Ear: Hearing normal.  Left Ear: Hearing normal.  Mouth/Throat: Mucous membranes are normal.  Bilateral TMs normal.  External ears normal.  Without posterior oropharyngeal erythema, tonsillar swelling or exudates. No lesions. Mild nasal turbinate swelling.  Clear drainage from nares.  No lymphadenopathy.    Neck: Normal range of motion. Neck supple.  Cardiovascular: Normal rate, regular rhythm and normal heart sounds.  Pulmonary/Chest: Effort normal and breath sounds normal.  Lungs clear in all fields. No dyspnea or distress. No retractions or nasal flaring.   Lymphadenopathy:    She has no cervical adenopathy.  Neurological: She is alert.  Skin: Skin is warm and dry. Rash noted.  Psychiatric: She has a normal mood and affect.  Nursing note and vitals reviewed.    UC Treatments / Results  Labs (all labs ordered are listed, but only abnormal results are displayed) Labs Reviewed  CULTURE, GROUP A STREP [facility])  POCT RAPID STREP A    EKG None  Radiology No results found.  Procedures Procedures (including critical care time)  Medications Ordered in UC Medications - No data to display  Initial Impression / Assessment and Plan / UC Course  I have reviewed the triage vital signs and the nursing notes.  Pertinent labs  & imaging results that were available during my care of the patient were reviewed by me and considered in my medical decision making (see chart for details).     Viral URI- symptomatic treatment with benzonatate and OTC medication Rash- will try a fungal cream to see if this helps since the triamcinolone cream is not helping She may need  to follow up with dermatologist if the rash does not improve.  Refilled metformin as requested Final Clinical Impressions(s) / UC Diagnoses   Final diagnoses:  Viral URI with cough  Rash     Discharge Instructions      It was nice meeting you!!  I believe you have a viral upper respiratory infection. It could take 7 to 10 days for the symptoms to decrease or improve.  We will treat your cough with Tessalon Perles. mucinex for the congestion.  Ibuprofen and tylenol can help with the pain.  Chloraseptic throat spray or lozenges are another option for symptoms relief.   Warm may help Follow up as needed for worsening symptoms.    For the rash follow up with dermatology We will try a different cream to see if this helps.     ED Prescriptions    Medication Sig Dispense Auth. Provider   benzonatate (TESSALON) 100 MG capsule Take 1 capsule (100 mg total) by mouth every 8 (eight) hours. 21 capsule Keyly Baldonado A, NP   dextromethorphan-guaiFENesin (MUCINEX DM) 30-600 MG 12hr tablet Take 1 tablet by mouth 2 (two) times daily. 15 tablet Amirrah Quigley A, NP   clotrimazole (LOTRIMIN) 1 % cream Apply to affected area 2 times daily 15 g Norton Bivins A, NP   metFORMIN (GLUCOPHAGE) 500 MG tablet Take 1 tablet (500 mg total) by mouth 2 (two) times daily with a meal. 30 tablet Santonio Speakman A, NP     Controlled Substance Prescriptions [place] Controlled Substance Registry consulted? Not Applicable   Janace Aris, NP 06/10/18 [phone]

## 2018-06-11 LAB — CULTURE, GROUP A STREP (THRC)

## 2018-06-24 ENCOUNTER — Ambulatory Visit (HOSPITAL_COMMUNITY)
Admission: EM | Admit: 2018-06-24 | Discharge: 2018-06-24 | Disposition: A | Discharge status: Home / Self Care | Payer: Self-pay | Attending: Emergency Medicine | Admitting: Emergency Medicine

## 2018-06-24 ENCOUNTER — Other Ambulatory Visit: Payer: Self-pay

## 2018-06-24 ENCOUNTER — Encounter (HOSPITAL_COMMUNITY): Payer: Self-pay

## 2018-06-24 DIAGNOSIS — J22 Unspecified acute lower respiratory infection: Secondary | ICD-10-CM

## 2018-06-24 MED ORDER — AZITHROMYCIN 250 MG PO TABS: ORAL_TABLET | ORAL | 0 refills | Status: AC

## 2018-06-24 MED ORDER — PREDNISONE 20 MG PO TABS: 40.0000 mg | ORAL_TABLET | Freq: Every day | ORAL | 0 refills | Status: AC

## 2018-06-24 NOTE — ED Triage Notes (Signed)
Pt c/o cough and right ear discomfort. X 2weeks or more.

## 2018-06-24 NOTE — Discharge Instructions (Signed)
Complete course of antibiotics.  Complete course of steroids- please know that this can increase your blood sugar so please be mindful of this while taking.  May continue with previously prescribed medications to help with symptoms.  Please follow up with provided clinics for recheck and management. They can see you even without insurance.  If develop worsening of symptoms please return or go to the Er.

## 2018-06-24 NOTE — ED Provider Notes (Signed)
[facility]    CSN: [id number] Arrival date & time: 06/24/18  4098     History   Chief Complaint Chief Complaint  Patient presents with  . Otalgia  . Cough    HPI Lisa [name] is a 37 y.o. female.   Lisa [name] presents with complaints of persistent cough which has worsened. Started at least two weeks ago. Was seen here 10/10 and prescribed supportive treatment for likely viral URI. Symptoms have not improved and she feels cough has worsened. Hard time sleeping. Cough has become severe and made her vomit even. Shortness of breath with cough. No known fevers. Right ear has been painful the past 5 days. No other gi/gu complaints. Mild runny nose and sore throat. No known ill contacts. No asthma history, doesn't smoke. Hx of DM. Doesn't follow with a PCP.     ROS per HPI.      Past Medical History:  Diagnosis Date  . Diabetes mellitus without complication (HCC)    type 1    Patient Active Problem List   Diagnosis Date Noted  . Supervision of high-risk pregnancy 08/15/2012  . Short cervix, antepartum 08/08/2012  . Twin gestation, dichorionic diamniotic 07/18/2012  . Gestational diabetes mellitus, antepartum 07/18/2012    History reviewed. No pertinent surgical history.  OB History    Gravida  1   Para  1   Term  0   Preterm  1   AB  0   Living  2     SAB  0   TAB  0   Ectopic  0   Multiple  1   Live Births  2            Home Medications    Prior to Admission medications   Medication Sig Start Date End Date Taking? Authorizing Provider  azithromycin (ZITHROMAX) 250 MG tablet Take 2 tablets (500 mg total) by mouth daily for 1 day, THEN 1 tablet (250 mg total) daily for 4 days. 06/24/18 06/29/18  Georgetta Haber, NP  benzonatate (TESSALON) 100 MG capsule Take 1 capsule (100 mg total) by mouth every 8 (eight) hours. 06/09/18   Dahlia Byes A, NP  cetirizine (ZYRTEC) 10 MG chewable tablet Chew 1 tablet (10 mg total) by mouth daily.  02/25/18   Wurst, [place], PA-C  clotrimazole (LOTRIMIN) 1 % cream Apply to affected area 2 times daily 06/09/18   Dahlia Byes A, NP  dextromethorphan-guaiFENesin (MUCINEX DM) 30-600 MG 12hr tablet Take 1 tablet by mouth 2 (two) times daily. 06/09/18   Dahlia Byes A, NP  metFORMIN (GLUCOPHAGE) 500 MG tablet Take 1 tablet (500 mg total) by mouth 2 (two) times daily with a meal. 06/09/18   Bast, Traci A, NP  naproxen (NAPROSYN) 375 MG tablet Take 1 tablet (375 mg total) by mouth 2 (two) times daily. 03/19/18   Wurst, [place], PA-C  predniSONE (DELTASONE) 20 MG tablet Take 2 tablets (40 mg total) by mouth daily with breakfast for 5 days. 06/24/18 06/29/18  Linus Mako B, NP  triamcinolone ointment (KENALOG) 0.5 % Apply 1 application topically 2 (two) times daily. 03/19/18   Rennis Harding, PA-C    Family History Family History  Problem Relation Age of Onset  . Diabetes Maternal Grandmother   . Diabetes Maternal Grandfather   . Other Neg Hx     Social History Social History   Tobacco Use  . Smoking status: Never Smoker  . Smokeless tobacco: Never Used  Substance Use Topics  .  Alcohol use: No  . Drug use: No     Allergies   Patient has no known allergies.   Review of Systems Review of Systems   Physical Exam Triage Vital Signs ED Triage Vitals [06/24/18 1032]  Enc Vitals Group     BP (!) 123/95     Pulse Rate 87     Resp 18     Temp 97.9 F (36.6 C)     Temp src      SpO2 96 %     Weight 200 lb (90.7 kg)     Height      Head Circumference      Peak Flow      Pain Score 8     Pain Loc      Pain Edu?      Excl. in [place]?    No data found.  Updated Vital Signs BP (!) 123/95   Pulse 87   Temp 97.9 F (36.6 C)   Resp 18   Wt 200 lb (90.7 kg)   LMP 06/17/2018   SpO2 96%   BMI 31.32 kg/m    Physical Exam  Constitutional: She is oriented to person, place, and time. She appears well-developed and well-nourished. No distress.  HENT:  Head: Normocephalic and  atraumatic.  Right Ear: Tympanic membrane, external ear and ear canal normal.  Left Ear: Tympanic membrane, external ear and ear canal normal.  Nose: Nose normal.  Mouth/Throat: Uvula is midline, oropharynx is clear and moist and mucous membranes are normal. No tonsillar exudate.  Bilateral ears with cerumen but TM remains visible, no acute findings   Eyes: Pupils are equal, round, and reactive to light. Conjunctivae and EOM are normal.  Cardiovascular: Normal rate, regular rhythm and normal heart sounds.  Pulmonary/Chest: Effort normal. She has decreased breath sounds in the left upper field and the left lower field.  Strong dry cough noted; illicited with deep inspiration   Neurological: She is alert and oriented to person, place, and time.  Skin: Skin is warm and dry.     UC Treatments / Results  Labs (all labs ordered are listed, but only abnormal results are displayed) Labs Reviewed - No data to display  EKG None  Radiology No results found.  Procedures Procedures (including critical care time)  Medications Ordered in UC Medications - No data to display  Initial Impression / Assessment and Plan / UC Course  I have reviewed the triage vital signs and the nursing notes.  Pertinent labs & imaging results that were available during my care of the patient were reviewed by me and considered in my medical decision making (see chart for details).     Non toxic. Afebrile. O2 96%. Symptoms for 2 weeks at least without improvement. Empiric azithromycin provided. Prednisone as well- discussed blood sugar risk with this as well. Return precautions provided. Encouraged establish with PCP. If symptoms worsen or do not improve in the next week to return to be seen or to follow up with PCP.  Patient verbalized understanding and agreeable to plan.   Final Clinical Impressions(s) / UC Diagnoses   Final diagnoses:  Lower respiratory tract infection     Discharge Instructions       Complete course of antibiotics.  Complete course of steroids- please know that this can increase your blood sugar so please be mindful of this while taking.  May continue with previously prescribed medications to help with symptoms.  Please follow up with provided clinics for  recheck and management. They can see you even without insurance.  If develop worsening of symptoms please return or go to the Er.    ED Prescriptions    Medication Sig Dispense Auth. Provider   predniSONE (DELTASONE) 20 MG tablet Take 2 tablets (40 mg total) by mouth daily with breakfast for 5 days. 10 tablet Linus Mako B, NP   azithromycin (ZITHROMAX) 250 MG tablet Take 2 tablets (500 mg total) by mouth daily for 1 day, THEN 1 tablet (250 mg total) daily for 4 days. 6 tablet Georgetta Haber, NP     Controlled Substance Prescriptions [place] Controlled Substance Registry consulted? Not Applicable   Georgetta Haber, NP 06/24/18 [phone]

## 2019-02-28 DIAGNOSIS — M545 Low back pain, unspecified: Secondary | ICD-10-CM | POA: Insufficient documentation

## 2019-06-23 ENCOUNTER — Ambulatory Visit: Payer: No Typology Code available for payment source | Attending: Nurse Practitioner | Admitting: Nurse Practitioner

## 2019-06-23 ENCOUNTER — Other Ambulatory Visit: Payer: Self-pay

## 2019-06-23 ENCOUNTER — Encounter: Payer: Self-pay | Admitting: Nurse Practitioner

## 2019-06-23 DIAGNOSIS — N760 Acute vaginitis: Secondary | ICD-10-CM

## 2019-06-23 DIAGNOSIS — R300 Dysuria: Secondary | ICD-10-CM

## 2019-06-23 DIAGNOSIS — Z130 Encounter for screening for diseases of the blood and blood-forming organs and certain disorders involving the immune mechanism: Secondary | ICD-10-CM

## 2019-06-23 DIAGNOSIS — Z7689 Persons encountering health services in other specified circumstances: Secondary | ICD-10-CM

## 2019-06-23 DIAGNOSIS — Z8632 Personal history of gestational diabetes: Secondary | ICD-10-CM

## 2019-06-23 DIAGNOSIS — Z13220 Encounter for screening for lipoid disorders: Secondary | ICD-10-CM

## 2019-06-23 DIAGNOSIS — Z1329 Encounter for screening for other suspected endocrine disorder: Secondary | ICD-10-CM

## 2019-06-23 NOTE — Progress Notes (Signed)
Virtual Visit via Telephone Note Due to national recommendations of social distancing due to [place] 19, telehealth visit is felt to be most appropriate for this patient at this time.  I discussed the limitations, risks, security and privacy concerns of performing an evaluation and management service by telephone and the availability of in person appointments. I also discussed with the patient that there may be a patient responsible charge related to this service. The patient expressed understanding and agreed to proceed.    I connected with Lisa [name] on 06/23/19  at   8:50 AM EDT  EDT by telephone and verified that I am speaking with the correct person using two identifiers.   Consent I discussed the limitations, risks, security and privacy concerns of performing an evaluation and management service by telephone and the availability of in person appointments. I also discussed with the patient that there may be a patient responsible charge related to this service. The patient expressed understanding and agreed to proceed.   Location of Patient: Private Residence   Location of Provider: [place] participating in Telemedicine visit: Geryl Rankins FNP-BC [place]  Interpreter ID: [phone]   History of Present Illness: Telemedicine visit for: Establish Care Uninsured. Patient has been advised to apply for financial assistance and schedule to see our financial counselor.   Gestation Diabetes She is out of metformin since last year. Will need to check A1c prior to prescribing any medications.  She denies any hypo or hyperglycemic symptoms at this time.  She is not monitoring her blood glucose levels at home however she does have a One Touch glucometer for use. Lab Results  Component Value Date   HGBA1C 5.9 (H) 07/18/2012   Vaginitis: Patient complains of an abnormal thick vaginal discharge, and dysuria for several days.STI Risk:  Very low risk of STD exposure. Other associated symptoms: none.Menstrual pattern: She had been bleeding regularly.    Past Medical History:  Diagnosis Date  . Gestational diabetes     History reviewed. No pertinent surgical history.  Family History  Problem Relation Age of Onset  . Diabetes Maternal Grandmother   . Diabetes Maternal Grandfather   . Other Neg Hx     Social History   Socioeconomic History  . Marital status: Married    Spouse name: Not on file  . Number of children: Not on file  . Years of education: Not on file  . Highest education level: Not on file  Occupational History  . Not on file  Social Needs  . Financial resource strain: Not on file  . Food insecurity    Worry: Not on file    Inability: Not on file  . Transportation needs    Medical: Not on file    Non-medical: Not on file  Tobacco Use  . Smoking status: Never Smoker  . Smokeless tobacco: Never Used  Substance and Sexual Activity  . Alcohol use: No  . Drug use: Yes    Types: Methamphetamines  . Sexual activity: Not Currently  Lifestyle  . Physical activity    Days per week: Not on file    Minutes per session: Not on file  . Stress: Not on file  Relationships  . Social Herbalist on phone: Not on file    Gets together: Not on file    Attends religious service: Not on file    Active member of club or organization: Not on  file    Attends meetings of clubs or organizations: Not on file    Relationship status: Not on file  Other Topics Concern  . Not on file  Social History Narrative  . Not on file     Observations/Objective: Awake, alert and oriented x 3   Review of Systems  Constitutional: Negative for fever, malaise/fatigue and weight loss.  HENT: Negative.  Negative for nosebleeds.   Eyes: Negative.  Negative for blurred vision, double vision and photophobia.  Respiratory: Negative.  Negative for cough and shortness of breath.   Cardiovascular: Negative.  Negative  for chest pain, palpitations and leg swelling.  Gastrointestinal: Negative.  Negative for heartburn, nausea and vomiting.  Genitourinary:       SEE HPI  Musculoskeletal: Negative.  Negative for myalgias.  Neurological: Negative.  Negative for dizziness, focal weakness, seizures and headaches.  Psychiatric/Behavioral: Negative.  Negative for suicidal ideas.    Assessment and Plan:  Diagnoses and all orders for this visit:  Encounter to establish care  Acute vaginitis -     Cervicovaginal ancillary only; Future -     POCT URINALYSIS DIP (CLINITEK); Future  History of gestational diabetes -     Hemoglobin A1c; Future -     CMP14+EGFR; Future  Screening for deficiency anemia -     CBC; Future  Lipid screening -     Lipid panel; Future  Thyroid disorder screening -     TSH; Future     Follow Up Instructions Return for NON FASTING LABS.     I discussed the assessment and treatment plan with the patient. The patient was provided an opportunity to ask questions and all were answered. The patient agreed with the plan and demonstrated an understanding of the instructions.   The patient was advised to call back or seek an in-person evaluation if the symptoms worsen or if the condition fails to improve as anticipated.  I provided  20 minutes of non-face-to-face time during this encounter including median intraservice time, reviewing previous notes, labs, imaging, medications and explaining diagnosis and management.  Gildardo Pounds, FNP-BC

## 2019-06-26 ENCOUNTER — Other Ambulatory Visit: Payer: Self-pay

## 2019-06-26 ENCOUNTER — Ambulatory Visit: Payer: Self-pay | Attending: Family Medicine

## 2019-06-26 DIAGNOSIS — Z8632 Personal history of gestational diabetes: Secondary | ICD-10-CM

## 2019-06-26 DIAGNOSIS — Z1329 Encounter for screening for other suspected endocrine disorder: Secondary | ICD-10-CM

## 2019-06-26 DIAGNOSIS — Z13220 Encounter for screening for lipoid disorders: Secondary | ICD-10-CM

## 2019-06-26 DIAGNOSIS — Z130 Encounter for screening for diseases of the blood and blood-forming organs and certain disorders involving the immune mechanism: Secondary | ICD-10-CM

## 2019-06-27 ENCOUNTER — Other Ambulatory Visit: Payer: Self-pay | Admitting: Nurse Practitioner

## 2019-06-27 LAB — CMP14+EGFR
ALT: 29 IU/L (ref 0–32)
AST: 26 IU/L (ref 0–40)
Albumin/Globulin Ratio: 1.6 (ref 1.2–2.2)
Albumin: 4.4 g/dL (ref 3.8–4.8)
Alkaline Phosphatase: 94 IU/L (ref 39–117)
BUN/Creatinine Ratio: 12 (ref 9–23)
BUN: 7 mg/dL (ref 6–20)
Bilirubin Total: <0.2 mg/dL (ref 0.0–1.2)
CO2: 24 mmol/L (ref 20–29)
Calcium: 9.7 mg/dL (ref 8.7–10.2)
Chloride: 98 mmol/L (ref 96–106)
Creatinine, Ser: 0.6 mg/dL (ref 0.57–1.00)
GFR calc Af Amer: 135 mL/min/{1.73_m2} (ref >59)
GFR calc non Af Amer: 117 mL/min/{1.73_m2} (ref >59)
Globulin, Total: 2.7 g/dL (ref 1.5–4.5)
Glucose: 297 mg/dL — ABNORMAL HIGH (ref 65–99)
Potassium: 4.5 mmol/L (ref 3.5–5.2)
Sodium: 135 mmol/L (ref 134–144)
Total Protein: 7.1 g/dL (ref 6.0–8.5)

## 2019-06-27 LAB — CBC
Hematocrit: 39.6 % (ref 34.0–46.6)
Hemoglobin: 13 g/dL (ref 11.1–15.9)
MCH: 24.8 pg — ABNORMAL LOW (ref 26.6–33.0)
MCHC: 32.8 g/dL (ref 31.5–35.7)
MCV: 75 fL — ABNORMAL LOW (ref 79–97)
Platelets: 251 10*3/uL (ref 150–450)
RBC: 5.25 x10E6/uL (ref 3.77–5.28)
RDW: 13.3 % (ref 11.7–15.4)
WBC: 4.5 10*3/uL (ref 3.4–10.8)

## 2019-06-27 LAB — LIPID PANEL
Chol/HDL Ratio: 3.8 ratio (ref 0.0–4.4)
Cholesterol, Total: 167 mg/dL (ref 100–199)
HDL: 44 mg/dL (ref >39)
LDL Chol Calc (NIH): 82 mg/dL (ref 0–99)
Triglycerides: 251 mg/dL — ABNORMAL HIGH (ref 0–149)
VLDL Cholesterol Cal: 41 mg/dL — ABNORMAL HIGH (ref 5–40)

## 2019-06-27 LAB — HEMOGLOBIN A1C
Est. average glucose Bld gHb Est-mCnc: 292 mg/dL
Hgb A1c MFr Bld: 11.8 % — ABNORMAL HIGH (ref 4.8–5.6)

## 2019-06-27 LAB — TSH: TSH: 4.82 u[IU]/mL — ABNORMAL HIGH (ref 0.450–4.500)

## 2019-06-27 MED ORDER — BD PEN NEEDLE MINI U/F 31G X 5 MM MISC: 6 refills | Status: DC

## 2019-06-27 MED ORDER — TRUE METRIX METER W/DEVICE KIT: PACK | 0 refills | Status: DC

## 2019-06-27 MED ORDER — JANUMET 50-1000 MG PO TABS: 1.0000 | ORAL_TABLET | Freq: Two times a day (BID) | ORAL | 2 refills | Status: DC

## 2019-06-27 MED ORDER — INSULIN GLARGINE 100 UNIT/ML SOLOSTAR PEN: 20.0000 [IU] | PEN_INJECTOR | Freq: Every day | SUBCUTANEOUS | 11 refills | Status: DC

## 2019-06-27 MED ORDER — TRUEPLUS LANCETS 28G MISC: 6 refills | Status: DC

## 2019-06-27 MED ORDER — TRUE METRIX BLOOD GLUCOSE TEST VI STRP: ORAL_STRIP | 12 refills | Status: DC

## 2019-06-27 MED ORDER — ATORVASTATIN CALCIUM 40 MG PO TABS: 40.0000 mg | ORAL_TABLET | Freq: Every day | ORAL | 3 refills | Status: DC

## 2019-06-28 MED FILL — !JANUMET 50-1000MG TABLET: 50-1000 | 30 days supply | Qty: 60 | Fill #0

## 2019-06-28 MED FILL — TRUEPLUS 5-BEVEL PEN NEEDLE: 31G X 5 MM | 90 days supply | Qty: 100 | Fill #0

## 2019-06-28 MED FILL — TRUE METRIX GLUCOSE TEST ST: 25 days supply | Qty: 100 | Fill #0

## 2019-06-28 MED FILL — !LANTUS SOLOSTAR 100UNITS/M: 100 | 30 days supply | Qty: 6 | Fill #0

## 2019-06-28 MED FILL — ATORVASTATIN CALCIUM 40 MG: 40 | 30 days supply | Qty: 30 | Fill #0

## 2019-06-28 MED FILL — TRUEplus LANCETS 28G MISC: 50 days supply | Qty: 100 | Fill #0

## 2019-06-28 MED FILL — !TRUE METRIX BLOOD GLUCOSE: 1 days supply | Qty: 1 | Fill #0

## 2019-07-06 ENCOUNTER — Other Ambulatory Visit: Payer: Self-pay

## 2019-07-06 ENCOUNTER — Ambulatory Visit: Payer: Self-pay | Attending: Family Medicine

## 2019-07-24 MED FILL — ?BASAGLAR 100 UNITS/ML KWPE: 100 | 30 days supply | Qty: 6 | Fill #1

## 2019-07-24 MED FILL — !JANUMET 50-1000MG TABLET: 50-1000 | 30 days supply | Qty: 60 | Fill #1

## 2019-07-24 MED FILL — TRUEPLUS 5-BEVEL PEN NEEDLE: 31G X 5 MM | 90 days supply | Qty: 100 | Fill #1

## 2019-07-24 MED FILL — TRUE METRIX GLUCOSE TEST ST: 25 days supply | Qty: 100 | Fill #1

## 2019-07-24 MED FILL — TRUEplus LANCETS 28G MISC: 50 days supply | Qty: 100 | Fill #1

## 2019-07-25 ENCOUNTER — Encounter (HOSPITAL_COMMUNITY): Payer: Self-pay

## 2019-07-25 ENCOUNTER — Other Ambulatory Visit: Payer: Self-pay

## 2019-07-25 ENCOUNTER — Ambulatory Visit (HOSPITAL_COMMUNITY)
Admission: EM | Admit: 2019-07-25 | Discharge: 2019-07-25 | Disposition: A | Discharge status: Home / Self Care | Payer: Self-pay | Attending: Family Medicine | Admitting: Family Medicine

## 2019-07-25 ENCOUNTER — Telehealth: Payer: Self-pay | Admitting: Nurse Practitioner

## 2019-07-25 DIAGNOSIS — H6123 Impacted cerumen, bilateral: Secondary | ICD-10-CM

## 2019-07-25 NOTE — ED Triage Notes (Signed)
Pt presents with bilateral ear fullness X 2 weeks.

## 2019-07-25 NOTE — Telephone Encounter (Signed)
Pt was called an informed that she was approve for CAFA and am sending a copy of the letter to her house

## 2019-07-25 NOTE — ED Provider Notes (Signed)
[facility]    CSN: [id number] Arrival date & time: 07/25/19  0805      History   Chief Complaint Chief Complaint  Patient presents with  . Ear Fullness    HPI Lisa [name] is a 38 y.o. female.   38 year old woman, established [facility] patient, originally from [place], complains of 2 weeks of decreased hearing out of her right ear.  She has had recurring problems with earwax.  She has no pain or fever.     Past Medical History:  Diagnosis Date  . Gestational diabetes     Patient Active Problem List   Diagnosis Date Noted  . Low back pain 02/28/2019  . Supervision of high-risk pregnancy 08/15/2012  . Short cervix, antepartum 08/08/2012  . Twin gestation, dichorionic diamniotic 07/18/2012  . Gestational diabetes mellitus, antepartum 07/18/2012    History reviewed. No pertinent surgical history.  OB History    Gravida  1   Para  1   Term  0   Preterm  1   AB  0   Living  2     SAB  0   TAB  0   Ectopic  0   Multiple  1   Live Births  2            Home Medications    Prior to Admission medications   Medication Sig Start Date End Date Taking? Authorizing Provider  atorvastatin (LIPITOR) 40 MG tablet Take 1 tablet (40 mg total) by mouth daily. 06/27/19   Gildardo Pounds, NP  Blood Glucose Monitoring Suppl (TRUE METRIX METER) w/Device KIT Use as instructed. Check blood glucose level by fingerstick twice per day. 06/27/19   Gildardo Pounds, NP  glucose blood (TRUE METRIX BLOOD GLUCOSE TEST) test strip Use as instructed 06/27/19   Gildardo Pounds, NP  Insulin Glargine (LANTUS) 100 UNIT/ML Solostar Pen Inject 20 Units into the skin daily at 10 pm. 06/27/19 07/27/19  Gildardo Pounds, NP  Insulin Pen Needle (B-D UF III MINI PEN NEEDLES) 31G X 5 MM MISC Use as instructed. Inject into the skin once nightly. 06/27/19   Gildardo Pounds, NP  metFORMIN (GLUCOPHAGE) 500 MG tablet Take 1 tablet (500 mg total) by mouth 2  (two) times daily with a meal. 06/09/18   Bast, Traci A, NP  sitaGLIPtin-metformin (JANUMET) 50-1000 MG tablet Take 1 tablet by mouth 2 (two) times daily with a meal. 06/27/19 07/27/19  Gildardo Pounds, NP  TRUEplus Lancets 28G MISC Use as instructed. Check blood glucose level by fingerstick twice per day. 06/27/19   Gildardo Pounds, NP    Family History Family History  Problem Relation Age of Onset  . Diabetes Maternal Grandmother   . Diabetes Maternal Grandfather   . Other Neg Hx     Social History Social History   Tobacco Use  . Smoking status: Never Smoker  . Smokeless tobacco: Never Used  Substance Use Topics  . Alcohol use: No  . Drug use: Yes    Types: Methamphetamines     Allergies   Patient has no known allergies.   Review of Systems Review of Systems  HENT: Positive for hearing loss.   All other systems reviewed and are negative.    Physical Exam Triage Vital Signs ED Triage Vitals [07/25/19 0820]  Enc Vitals Group     BP      Pulse      Resp  Temp      Temp src      SpO2      Weight      Height      Head Circumference      Peak Flow      Pain Score 3     Pain Loc      Pain Edu?      Excl. in [place]?    No data found.  Updated Vital Signs BP 100/73 (BP Location: Left Arm)   Pulse 73   Temp 97.7 F (36.5 C) (Oral)   Resp 17   LMP 07/21/2019   SpO2 100%    Physical Exam Vitals signs and nursing note reviewed.  Constitutional:      General: She is not in acute distress.    Appearance: Normal appearance.  HENT:     Head: Normocephalic.     Right Ear: There is impacted cerumen.     Left Ear: There is impacted cerumen.  Eyes:     Conjunctiva/sclera: Conjunctivae normal.  Neck:     Musculoskeletal: Normal range of motion and neck supple.  Cardiovascular:     Rate and Rhythm: Normal rate.  Pulmonary:     Effort: Pulmonary effort is normal.  Musculoskeletal: Normal range of motion.  Skin:    General: Skin is warm and dry.   Neurological:     General: No focal deficit present.     Mental Status: She is alert.     Gait: Gait normal.  Psychiatric:        Mood and Affect: Mood normal.        Thought Content: Thought content normal.        Judgment: Judgment normal.   Both ears were lavaged using a water syringe, and were clear with normal TMs visualized   UC Treatments / Results  Labs (all labs ordered are listed, but only abnormal results are displayed) Labs Reviewed - No data to display  EKG   Radiology No results found.  Procedures Procedures (including critical care time)  Medications Ordered in UC Medications - No data to display  Initial Impression / Assessment and Plan / UC Course  I have reviewed the triage vital signs and the nursing notes.  Pertinent labs & imaging results that were available during my care of the patient were reviewed by me and considered in my medical decision making (see chart for details).     Final Clinical Impressions(s) / UC Diagnoses   Final diagnoses:  Bilateral impacted cerumen   Discharge Instructions   None    ED Prescriptions    None     I have reviewed the PDMP during this encounter.   Robyn Haber, MD 07/25/19 [phone]

## 2019-07-25 NOTE — Telephone Encounter (Signed)
Patient came in requesting to know the status of her CAFA. Please f/u  °

## 2019-09-22 ENCOUNTER — Other Ambulatory Visit: Payer: Self-pay

## 2019-09-22 ENCOUNTER — Encounter: Payer: Self-pay | Admitting: Nurse Practitioner

## 2019-09-22 ENCOUNTER — Ambulatory Visit: Payer: Self-pay | Attending: Nurse Practitioner | Admitting: Nurse Practitioner

## 2019-09-22 VITALS — BP 161/78 | HR 80 | Temp 98.7°F | Ht 67.5 in | Wt 234.0 lb

## 2019-09-22 DIAGNOSIS — IMO0002 Reserved for concepts with insufficient information to code with codable children: Secondary | ICD-10-CM

## 2019-09-22 DIAGNOSIS — E119 Type 2 diabetes mellitus without complications: Secondary | ICD-10-CM | POA: Insufficient documentation

## 2019-09-22 DIAGNOSIS — E1165 Type 2 diabetes mellitus with hyperglycemia: Secondary | ICD-10-CM

## 2019-09-22 DIAGNOSIS — E785 Hyperlipidemia, unspecified: Secondary | ICD-10-CM

## 2019-09-22 DIAGNOSIS — R7989 Other specified abnormal findings of blood chemistry: Secondary | ICD-10-CM

## 2019-09-22 DIAGNOSIS — R109 Unspecified abdominal pain: Secondary | ICD-10-CM

## 2019-09-22 DIAGNOSIS — E118 Type 2 diabetes mellitus with unspecified complications: Secondary | ICD-10-CM

## 2019-09-22 LAB — POCT GLYCOSYLATED HEMOGLOBIN (HGB A1C): Hemoglobin A1C: 8.3 % — AB (ref 4.0–5.6)

## 2019-09-22 LAB — GLUCOSE, POCT (MANUAL RESULT ENTRY): POC Glucose: 131 mg/dl — AB (ref 70–99)

## 2019-09-22 MED ORDER — ATORVASTATIN CALCIUM 40 MG PO TABS: 40.0000 mg | ORAL_TABLET | Freq: Every day | ORAL | 3 refills | Status: DC

## 2019-09-22 MED ORDER — BD PEN NEEDLE MINI U/F 31G X 5 MM MISC: 6 refills | Status: DC

## 2019-09-22 MED ORDER — INSULIN GLARGINE 100 UNIT/ML SOLOSTAR PEN: 20.0000 [IU] | PEN_INJECTOR | Freq: Every day | SUBCUTANEOUS | 11 refills | Status: DC

## 2019-09-22 MED ORDER — METFORMIN HCL 500 MG PO TABS: 500.0000 mg | ORAL_TABLET | Freq: Two times a day (BID) | ORAL | 1 refills | Status: DC

## 2019-09-22 MED ORDER — IBUPROFEN 600 MG PO TABS: 600.0000 mg | ORAL_TABLET | Freq: Three times a day (TID) | ORAL | 1 refills | Status: DC | PRN

## 2019-09-22 NOTE — Progress Notes (Signed)
Assessment & Plan:  [name] was seen today for flank pain.  Diagnoses and all orders for this visit:  Diabetes mellitus type 2, uncontrolled, with complications (HCC) -     Glucose (CBG) -     HgB A1c -     CMP14+EGFR -     Insulin Glargine (LANTUS) 100 UNIT/ML Solostar Pen; Inject 20 Units into the skin daily at 10 pm. -     Insulin Pen Needle (B-D UF III MINI PEN NEEDLES) 31G X 5 MM MISC; Use as instructed. Inject into the skin once nightly. -     metFORMIN (GLUCOPHAGE) 500 MG tablet; Take 1 tablet (500 mg total) by mouth 2 (two) times daily with a meal. Continue blood sugar control as discussed in office today, low carbohydrate diet, and regular physical exercise as tolerated, 150 minutes per week (30 min each day, 5 days per week, or 50 min 3 days per week). Keep blood sugar logs with fasting goal of 90-130 mg/dl, post prandial (after you eat) less than 180.  For Hypoglycemia: BS <60 and Hyperglycemia BS >400; contact the clinic ASAP. Annual eye exams and foot exams are recommended.   Right flank pain -     POCT URINALYSIS DIP (CLINITEK) -     ibuprofen (ADVIL) 600 MG tablet; Take 1 tablet (600 mg total) by mouth every 8 (eight) hours as needed.  Dyslipidemia, goal LDL below 70 -     atorvastatin (LIPITOR) 40 MG tablet; Take 1 tablet (40 mg total) by mouth daily. INSTRUCTIONS: Work on a low fat, heart healthy diet and participate in regular aerobic exercise program by working out at least 150 minutes per week; 5 days a week-30 minutes per day. Avoid red meat/beef/steak,  fried foods. junk foods, sodas, sugary drinks, unhealthy snacking, alcohol and smoking.  Drink at least 80 oz of water per day and monitor your carbohydrate intake daily.   Elevated TSH Repeat TSH  Patient has been counseled on age-appropriate routine health concerns for screening and prevention. These are reviewed and up-to-date. Referrals have been placed accordingly. Immunizations are up-to-date or declined.      Subjective:   Chief Complaint  Patient presents with  . Flank Pain    Pt. is having right side pain, she think it may be her kidney.    HPI Lisa [name] [name] 39 y.o. female presents to office today for follow up and with complaints of right sided pain. VRI was used to communicate directly with patient for the entire encounter including providing detailed patient instructions.   DM TYPE 2 A1c improved. Down from 11.8 to 8. Monitoring her blood glucose levels infrequently. Only checks when she doesn't feel good.  Recommended she monitor her blood glucose levels twice a day. Fasting and postprandial. Walking 30 minutes every day. Avoiding pasta, carbs, sweets. Will not make any changes with her medications today. Will bring her back in 6 weeks for meter check. Current medications: metformin 500 mg BID, lantus 20 units daily. She is overdue for eye exam.  Lab Results  Component Value Date   HGBA1C 8.3 (A) 09/22/2019   Lab Results  Component Value Date   HGBA1C 11.8 (H) 06/26/2019    Flank Pain Right sided pain for months. Worsening over the past week. Not related to her menstrual cycles. Aggravating factors: lying on right side. Pain described as sharp and 8/10.  Denies hematuria, dysuria, nausea, vomiting or fever. She has bowel movements every day.   Hyperlipidemia Patient presents for  follow up to hyperlipidemia. LDL not at goal of < 70.  She is medication compliant taking atorvastatin 40 mg daily. She is diet compliant and denies  statin intolerance including myalgias.  Lab Results  Component Value Date   CHOL 167 06/26/2019   Lab Results  Component Value Date   HDL 44 06/26/2019   Lab Results  Component Value Date   LDLCALC 82 06/26/2019   Lab Results  Component Value Date   TRIG 251 (H) 06/26/2019   Lab Results  Component Value Date   CHOLHDL 3.8 06/26/2019   Review of Systems  Constitutional: Negative for fever, malaise/fatigue and weight loss.  HENT:  Negative.  Negative for nosebleeds.   Eyes: Negative.  Negative for blurred vision, double vision and photophobia.  Respiratory: Negative.  Negative for cough and shortness of breath.   Cardiovascular: Negative.  Negative for chest pain, palpitations and leg swelling.  Gastrointestinal: Negative.  Negative for heartburn, nausea and vomiting.  Genitourinary: Positive for flank pain.  Musculoskeletal: Negative for myalgias.  Neurological: Negative.  Negative for dizziness, focal weakness, seizures and headaches.  Psychiatric/Behavioral: Negative.  Negative for suicidal ideas.    Past Medical History:  Diagnosis Date  . Gestational diabetes     History reviewed. No pertinent surgical history.  Family History  Problem Relation Age of Onset  . Diabetes Maternal Grandmother   . Diabetes Maternal Grandfather   . Other Neg Hx     Social History Reviewed with no changes to be made today.   Outpatient Medications Prior to Visit  Medication Sig Dispense Refill  . Blood Glucose Monitoring Suppl (TRUE METRIX METER) w/Device KIT Use as instructed. Check blood glucose level by fingerstick twice per day. 1 kit 0  . glucose blood (TRUE METRIX BLOOD GLUCOSE TEST) test strip Use as instructed 100 each 12  . TRUEplus Lancets 28G MISC Use as instructed. Check blood glucose level by fingerstick twice per day. 100 each 6  . Insulin Pen Needle (B-D UF III MINI PEN NEEDLES) 31G X 5 MM MISC Use as instructed. Inject into the skin once nightly. 100 each 6  . metFORMIN (GLUCOPHAGE) 500 MG tablet Take 1 tablet (500 mg total) by mouth 2 (two) times daily with a meal. 30 tablet 1  . atorvastatin (LIPITOR) 40 MG tablet Take 1 tablet (40 mg total) by mouth daily. (Patient not taking: Reported on 09/22/2019) 90 tablet 3  . Insulin Glargine (LANTUS) 100 UNIT/ML Solostar Pen Inject 20 Units into the skin daily at 10 pm. 15 mL 11  . sitaGLIPtin-metformin (JANUMET) 50-1000 MG tablet Take 1 tablet by mouth 2 (two) times  daily with a meal. 60 tablet 2   No facility-administered medications prior to visit.    No Known Allergies     Objective:    BP (!) 161/78 (BP Location: Left Arm, Patient Position: Sitting, Cuff Size: Large)   Pulse 80   Temp 98.7 F (37.1 C) (Oral)   Ht 5' 7.5" (1.715 m)   Wt 234 lb (106.1 kg)   SpO2 100%   BMI 36.11 kg/m  Wt Readings from Last 3 Encounters:  09/22/19 234 lb (106.1 kg)  09/21/15 245 lb (111.1 kg)  02/28/19 245 lb (111.1 kg)    Physical Exam Vitals and nursing note reviewed.  Constitutional:      Appearance: She is well-developed.  HENT:     Head: Normocephalic and atraumatic.  Cardiovascular:     Rate and Rhythm: Normal rate and regular rhythm.  Heart sounds: Normal heart sounds. No murmur. No friction rub. No gallop.   Pulmonary:     Effort: Pulmonary effort is normal. No tachypnea or respiratory distress.     Breath sounds: Normal breath sounds. No decreased breath sounds, wheezing, rhonchi or rales.  Chest:     Chest wall: No tenderness.  Abdominal:     General: Bowel sounds are normal.     Palpations: Abdomen is soft.  Musculoskeletal:        General: Normal range of motion.     Cervical back: Normal range of motion.  Skin:    General: Skin is warm and dry.  Neurological:     Mental Status: She is alert and oriented to person, place, and time.     Coordination: Coordination normal.  Psychiatric:        Behavior: Behavior normal. Behavior is cooperative.        Thought Content: Thought content normal.        Judgment: Judgment normal.          Patient has been counseled extensively about nutrition and exercise as well as the importance of adherence with medications and regular follow-up. The patient was given clear instructions to go to ER or return to medical center if symptoms don't improve, worsen or new problems develop. The patient verbalized understanding.   Follow-up: Return in about 6 weeks (around 11/03/2019) for meter  check .   Gildardo Pounds, FNP-BC [facility] [facility], [place]   09/22/2019, 3:22 PM

## 2019-09-23 LAB — CMP14+EGFR
ALT: 23 IU/L (ref 0–32)
AST: 21 IU/L (ref 0–40)
Albumin/Globulin Ratio: 1.5 (ref 1.2–2.2)
Albumin: 4.5 g/dL (ref 3.8–4.8)
Alkaline Phosphatase: 78 IU/L (ref 39–117)
BUN/Creatinine Ratio: 16 (ref 9–23)
BUN: 9 mg/dL (ref 6–20)
Bilirubin Total: 0.4 mg/dL (ref 0.0–1.2)
CO2: 25 mmol/L (ref 20–29)
Calcium: 9.7 mg/dL (ref 8.7–10.2)
Chloride: 101 mmol/L (ref 96–106)
Creatinine, Ser: 0.58 mg/dL (ref 0.57–1.00)
GFR calc Af Amer: 135 mL/min/{1.73_m2} (ref >59)
GFR calc non Af Amer: 117 mL/min/{1.73_m2} (ref >59)
Globulin, Total: 3.1 g/dL (ref 1.5–4.5)
Glucose: 119 mg/dL — ABNORMAL HIGH (ref 65–99)
Potassium: 4.2 mmol/L (ref 3.5–5.2)
Sodium: 138 mmol/L (ref 134–144)
Total Protein: 7.6 g/dL (ref 6.0–8.5)

## 2019-09-23 LAB — THYROID PANEL WITH TSH
Free Thyroxine Index: 1.7 (ref 1.2–4.9)
T3 Uptake Ratio: 23 % — ABNORMAL LOW (ref 24–39)
T4, Total: 7.3 ug/dL (ref 4.5–12.0)
TSH: 3.11 u[IU]/mL (ref 0.450–4.500)

## 2019-09-24 ENCOUNTER — Encounter: Payer: Self-pay | Admitting: Nurse Practitioner

## 2019-09-24 MED ORDER — CYCLOBENZAPRINE HCL 5 MG PO TABS: 5.0000 mg | ORAL_TABLET | Freq: Three times a day (TID) | ORAL | 1 refills | Status: DC | PRN

## 2019-09-25 LAB — POCT URINALYSIS DIP (CLINITEK)
Bilirubin, UA: NEGATIVE
Glucose, UA: NEGATIVE mg/dL
Ketones, POC UA: NEGATIVE mg/dL
Leukocytes, UA: NEGATIVE
Nitrite, UA: NEGATIVE
POC PROTEIN,UA: NEGATIVE
Spec Grav, UA: >=1.03 — AB (ref 1.010–1.025)
Urobilinogen, UA: 0.2 E.U./dL
pH, UA: 5.5 (ref 5.0–8.0)

## 2019-09-25 MED FILL — TRUEPLUS 5-BEVEL PEN NEEDLE: 31G X 5 MM | 90 days supply | Qty: 100 | Fill #0

## 2019-09-25 MED FILL — metFORMIN HCL 500 MG TABS: 500 | 30 days supply | Qty: 60 | Fill #0

## 2019-09-25 MED FILL — ?ATORVASTATIN 40MG TABL: 40 | 30 days supply | Qty: 30 | Fill #0

## 2019-09-25 MED FILL — ?BASAGLAR 100 UNITS/ML KWPE: 100 | 30 days supply | Qty: 6 | Fill #0

## 2019-09-25 MED FILL — CYCLOBENZAPRINE 5 MG TABLET: 5 | 10 days supply | Qty: 30 | Fill #0

## 2019-09-25 MED FILL — ?IBUPROFEN 600 MG TABLETS: 600 | 20 days supply | Qty: 60 | Fill #0
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
## 2019-10-11 ENCOUNTER — Ambulatory Visit: Payer: Self-pay | Admitting: Nurse Practitioner

## 2019-11-03 ENCOUNTER — Ambulatory Visit: Payer: Self-pay | Attending: Nurse Practitioner | Admitting: Pharmacist

## 2019-11-03 ENCOUNTER — Other Ambulatory Visit: Payer: Self-pay

## 2019-11-03 DIAGNOSIS — E1165 Type 2 diabetes mellitus with hyperglycemia: Secondary | ICD-10-CM

## 2019-11-03 DIAGNOSIS — IMO0002 Reserved for concepts with insufficient information to code with codable children: Secondary | ICD-10-CM

## 2019-11-03 DIAGNOSIS — E118 Type 2 diabetes mellitus with unspecified complications: Secondary | ICD-10-CM

## 2019-11-03 NOTE — Progress Notes (Signed)
    S:    PCP: Zelda  No chief complaint on file.   Patient arrives in good spirits.  Presents for diabetes evaluation, education, and management Patient was referred and last seen by Primary Care Provider on 09/22/19.     Family/Social History:  - FHx: DM - Tobacco: never smoker - Alcohol: denies use   Insurance coverage/medication affordability: self pay  Patient reports adherence with medications.  Current diabetes medications include: Lantus 20 units daily, metformin 500 mg BID  Patient denies hypoglycemic events.  Patient reported dietary habits:  - She avoids pasta, carbs, and sweets   Patient-reported exercise habits:  - Walks 30 minutes daily    Patient denies nocturia (nighttime urination).  Patient denies neuropathy (nerve pain). Patient denies visual changes. Patient reports self foot exams.     O:   Lab Results  Component Value Date   HGBA1C 8.3 (A) 09/22/2019   There were no vitals filed for this visit.  Lipid Panel     Component Value Date/Time   CHOL 167 06/26/2019 1516   TRIG 251 (H) 06/26/2019 1516   HDL 44 06/26/2019 1516   CHOLHDL 3.8 06/26/2019 1516   LDLCALC 82 06/26/2019 1516   Home glycemic control:  She does not have her meter and is not checking her CBGs daily. She checks every 2-3 days.  Home fasting blood sugars: 120s - 170s  2 hour post-meal/random blood sugars: 200s-low 300s.   Clinical Atherosclerotic Cardiovascular Disease (ASCVD): No  The ASCVD Risk score Denman George DC Jr., et al., 2013) failed to calculate for the following reasons:   The 2013 ASCVD risk score is only valid for ages 10 to 69    A/P: Diabetes longstanding currently uncontrolled. Patient is able to verbalize appropriate hypoglycemia management plan. Patient is adherent with medication. -Increased dose of Lantus to 24 units.  -Extensively discussed pathophysiology of diabetes, recommended lifestyle interventions, dietary effects on blood sugar  control -Counseled on s/sx of and management of hypoglycemia -Next A1C anticipated 11/2019.   Written patient instructions provided.  Total time in face to face counseling 30 minutes.   Follow up Pharmacist Clinic Visit in 3 weeks.   Butch Penny, PharmD, CPP Clinical Pharmacist [facility] [phone]

## 2019-11-24 ENCOUNTER — Ambulatory Visit: Payer: Self-pay | Attending: Nurse Practitioner | Admitting: Pharmacist

## 2019-11-24 ENCOUNTER — Other Ambulatory Visit: Payer: Self-pay | Admitting: Nurse Practitioner

## 2019-11-24 ENCOUNTER — Other Ambulatory Visit: Payer: Self-pay

## 2019-11-24 DIAGNOSIS — IMO0002 Reserved for concepts with insufficient information to code with codable children: Secondary | ICD-10-CM

## 2019-11-24 DIAGNOSIS — E1165 Type 2 diabetes mellitus with hyperglycemia: Secondary | ICD-10-CM

## 2019-11-24 DIAGNOSIS — E118 Type 2 diabetes mellitus with unspecified complications: Secondary | ICD-10-CM

## 2019-11-24 LAB — GLUCOSE, POCT (MANUAL RESULT ENTRY): POC Glucose: 287 mg/dl — AB (ref 70–99)

## 2019-11-24 MED ORDER — INSULIN GLARGINE 100 UNIT/ML SOLOSTAR PEN: 28.0000 [IU] | PEN_INJECTOR | Freq: Every day | SUBCUTANEOUS | 2 refills | Status: DC

## 2019-11-24 MED ORDER — METFORMIN HCL 500 MG PO TABS: 1000.0000 mg | ORAL_TABLET | Freq: Two times a day (BID) | ORAL | 2 refills | Status: DC

## 2019-11-24 MED FILL — ?BASAGLAR 100 UNITS/ML KWPE: 100 | 21 days supply | Qty: 6 | Fill #0

## 2019-11-24 MED FILL — ?METFORMIN HCL 500MG TABLET: 500 | 30 days supply | Qty: 120 | Fill #0

## 2019-11-24 NOTE — Progress Notes (Signed)
    S:     PCP: Meredeth Ide  No chief complaint on file.  Patient arrives in good spirits.  Presents for diabetes evaluation, education, and management Patient was referred and last seen by Primary Care Provider on 09/22/19.  Patient last seen by Clinical Pharmacist on 11/03/19 in which Lantus was increased to 24 units daily.   Family/Social History:  - FHx: DM - Tobacco: never smoker - Alcohol: denies use   Insurance coverage/medication affordability: self pay  Patient reports adherence with medications. Patient reports having tea and a slice of bread this morning. Patient reports sugars are still high at home, 200 mg/dL before breakfast this morning and 250 mg/dL after dinner last night. Patient reports severe headaches for the past 3 days and OTC meds are not working.   Current diabetes medications include: Lantus 24 units daily, Metformin 500 mg BID  Patient denies hypoglycemic events.  Patient reported dietary habits:  - She avoids pasta, carbs, and sweets   Patient-reported exercise habits:  - Walks 30 minutes daily    Patient denies nocturia (nighttime urination).  Patient denies neuropathy (nerve pain). Patient denies visual changes. Patient reports self foot exams.    O:  POCT: 287 (after breakfast)  Home fasting blood sugars: 200 this morning prior to visit 2 hour post-meal/random blood sugars: 250 last night, 100-200s  Lab Results  Component Value Date   HGBA1C 8.3 (A) 09/22/2019   There were no vitals filed for this visit.  Lipid Panel     Component Value Date/Time   CHOL 167 06/26/2019 1516   TRIG 251 (H) 06/26/2019 1516   HDL 44 06/26/2019 1516   CHOLHDL 3.8 06/26/2019 1516   LDLCALC 82 06/26/2019 1516    Clinical Atherosclerotic Cardiovascular Disease (ASCVD): No  The ASCVD Risk score Denman George DC Jr., et al., 2013) failed to calculate for the following reasons:   The 2013 ASCVD risk score is only valid for ages 69 to 74    A/P: Diabetes  longstanding currently uncontrolled. Patient is able to verbalize appropriate hypoglycemia management plan. Patient is adherent with medication. Post-prandial POCT BG was elevated at 287 mg/dL and home sugars remain elevated in the 200s. Patient denies GI side effects with metformin. In regards to patient's severe headaches, instructed patient to make an appt with PCP in April for headache management. Patient verbalized understanding.  -Increased Lantus to 28 units daily (patient takes in the evening) -Increased Metformin to 1000 mg BID -Consider GLP at next visit if diabetes is still uncontrolled - possibly Trulicity due to once-weekly dosing and patient preference -Extensively discussed pathophysiology of diabetes, recommended lifestyle interventions, dietary effects on blood sugar control -Counseled on s/sx of and management of hypoglycemia -Next A1C anticipated 11/2019.    Written patient instructions provided.  Total time in face to face counseling 25 minutes.   Follow up PCP Clinic Visit in April 2021.     Patient seen with:  Fabio Neighbors, PharmD PGY1 Ambulatory Care Resident [facility]

## 2019-11-25 ENCOUNTER — Other Ambulatory Visit: Payer: Self-pay

## 2019-11-25 ENCOUNTER — Encounter (HOSPITAL_BASED_OUTPATIENT_CLINIC_OR_DEPARTMENT_OTHER): Payer: Self-pay | Admitting: Emergency Medicine

## 2019-11-25 DIAGNOSIS — R519 Headache, unspecified: Secondary | ICD-10-CM | POA: Insufficient documentation

## 2019-11-25 DIAGNOSIS — Z79899 Other long term (current) drug therapy: Secondary | ICD-10-CM | POA: Insufficient documentation

## 2019-11-25 NOTE — ED Triage Notes (Addendum)
Pt reports HA and blurred vision and photophobia x 3 days. Denies other sx.

## 2019-11-26 ENCOUNTER — Emergency Department (HOSPITAL_BASED_OUTPATIENT_CLINIC_OR_DEPARTMENT_OTHER)
Admission: EM | Admit: 2019-11-26 | Discharge: 2019-11-26 | Disposition: A | Discharge status: Home / Self Care | Payer: Self-pay | Attending: Emergency Medicine | Admitting: Emergency Medicine

## 2019-11-26 DIAGNOSIS — R519 Headache, unspecified: Secondary | ICD-10-CM

## 2019-11-26 LAB — CBG MONITORING, ED: Glucose-Capillary: 215 mg/dL — ABNORMAL HIGH (ref 70–99)

## 2019-11-26 LAB — PREGNANCY, URINE: Preg Test, Ur: NEGATIVE

## 2019-11-26 MED ORDER — KETOROLAC TROMETHAMINE 15 MG/ML IJ SOLN
15.0000 mg | Freq: Once | INTRAMUSCULAR | Status: AC
  Administered 2019-11-26: 15 mg via INTRAVENOUS
  Filled 2019-11-26: qty 1

## 2019-11-26 MED ORDER — METOCLOPRAMIDE HCL 5 MG/ML IJ SOLN
10.0000 mg | Freq: Once | INTRAMUSCULAR | Status: AC
  Administered 2019-11-26: 10 mg via INTRAVENOUS
  Filled 2019-11-26: qty 2

## 2019-11-26 MED ORDER — DIPHENHYDRAMINE HCL 50 MG/ML IJ SOLN
25.0000 mg | Freq: Once | INTRAMUSCULAR | Status: AC
  Administered 2019-11-26: 02:00:00 25 mg via INTRAVENOUS
  Filled 2019-11-26: qty 1

## 2019-11-26 MED ORDER — SODIUM CHLORIDE 0.9 % IV BOLUS
1000.0000 mL | Freq: Once | INTRAVENOUS | Status: AC
  Administered 2019-11-26: 1000 mL via INTRAVENOUS

## 2019-11-26 NOTE — ED Notes (Signed)
Pt. Doesn't want to change into gown, culture reasons.

## 2019-11-26 NOTE — ED Notes (Signed)
Asked patient if she has family waiting in the car - no family waiting to see patient at this time, pt reports they went home with child.

## 2019-11-26 NOTE — ED Provider Notes (Signed)
[place] DEPT [facility] Provider Note: Georgena Spurling, MD, FACEP  CSN: [id number] MRN: [id number] ARRIVAL: 11/25/19 at 2120 ROOM: [place]  Headache   HISTORY OF PRESENT ILLNESS  11/26/19 1:11 AM Lisa [name] is a 39 y.o. female with a history of headaches.  She is here with a headache with blurred vision and photophobia that began 2 days ago.  She rates her pain as a 10 out of 10 and localizes it to the bifrontal area.  She has taken over-the-counter analgesics without adequate relief.  She denies nausea or vomiting.   Past Medical History:  Diagnosis Date  . Gestational diabetes     History reviewed. No pertinent surgical history.  Family History  Problem Relation Age of Onset  . Diabetes Maternal Grandmother   . Diabetes Maternal Grandfather   . Other Neg Hx     Social History   Tobacco Use  . Smoking status: Never Smoker  . Smokeless tobacco: Never Used  Substance Use Topics  . Alcohol use: No  . 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 Authorizing Provider  atorvastatin (LIPITOR) 40 MG tablet Take 1 tablet (40 mg total) by mouth daily. 09/22/19   Gildardo Pounds, NP  Blood Glucose Monitoring Suppl (TRUE METRIX METER) w/Device KIT Use as instructed. Check blood glucose level by fingerstick twice per day. 06/27/19   Gildardo Pounds, NP  cyclobenzaprine (FLEXERIL) 5 MG tablet Take 1 tablet (5 mg total) by mouth 3 (three) times daily as needed for muscle spasms. 09/24/19   Gildardo Pounds, NP  glucose blood (TRUE METRIX BLOOD GLUCOSE TEST) test strip Use as instructed 06/27/19   Gildardo Pounds, NP  ibuprofen (ADVIL) 600 MG tablet Take 1 tablet (600 mg total) by mouth every 8 (eight) hours as needed. 09/22/19   Gildardo Pounds, NP  insulin glargine (LANTUS) 100 UNIT/ML Solostar Pen Inject 28 Units into the skin daily at 10 pm. 11/24/19 02/28/20   Charlott Rakes, MD  Insulin Pen Needle (B-D UF III MINI PEN NEEDLES) 31G X 5 MM MISC Use as instructed. Inject into the skin once nightly. 09/22/19   Gildardo Pounds, NP  metFORMIN (GLUCOPHAGE) 500 MG tablet Take 2 tablets (1,000 mg total) by mouth 2 (two) times daily with a meal. 11/24/19   Gildardo Pounds, NP  TRUEplus Lancets 28G MISC Use as instructed. Check blood glucose level by fingerstick twice per day. 06/27/19   Gildardo Pounds, NP    Allergies Patient has no known allergies.   REVIEW OF SYSTEMS  Negative except as noted here or in the History of Present Illness.   PHYSICAL EXAMINATION  Initial Vital Signs Blood pressure (!) 138/93, pulse (!) 57, temperature 97.7 F (36.5 C), temperature source Oral, resp. rate 16, last menstrual period 11/11/2019, SpO2 100 %.  Examination General: Well-developed, well-nourished female in no acute distress; appearance consistent with age of record HENT: normocephalic; atraumatic Eyes: pupils equal, round and reactive to light; extraocular muscles intact; photophobia Neck: supple Heart: regular rate and rhythm Lungs: clear to auscultation bilaterally Abdomen: soft; nondistended; nontender; bowel sounds present Extremities: No deformity; full range of motion; pulses normal Neurologic: Awake, alert and oriented; motor function intact in all extremities and symmetric; no facial droop Skin: Warm and dry Psychiatric: Flat affect   RESULTS  Summary of this visit's results, reviewed and interpreted by  myself:   EKG Interpretation  Date/Time:    Ventricular Rate:    PR Interval:    QRS Duration:   QT Interval:    QTC Calculation:   R Axis:     Text Interpretation:        Laboratory Studies: Results for orders placed or performed during the hospital encounter of 11/26/19 (from the past 24 hour(s))  Pregnancy, urine     Status: None   Collection Time: 11/26/19  1:37 AM  Result Value Ref Range   Preg Test, Ur NEGATIVE NEGATIVE    CBG monitoring, ED     Status: Abnormal   Collection Time: 11/26/19  1:54 AM  Result Value Ref Range   Glucose-Capillary 215 (H) 70 - 99 mg/dL   Imaging Studies: No results found.  ED COURSE and MDM  Nursing notes, initial and subsequent vitals signs, including pulse oximetry, reviewed and interpreted by myself.  Vitals:   11/25/19 2128 11/26/19 0018 11/26/19 0323  BP: (!) 154/94 (!) 138/93 123/72  Pulse: 79 (!) 57 73  Resp: _0 Temp: 97.7 F (36.5 C)    TempSrc: Oral    SpO2: 100% 100% 100%   Medications  sodium chloride 0.9 % bolus 1,000 mL (1,000 mLs Intravenous New Bag/Given 11/26/19 0142)  diphenhydrAMINE (BENADRYL) injection 25 mg (25 mg Intravenous Given 11/26/19 0143)  metoCLOPramide (REGLAN) injection 10 mg (10 mg Intravenous Given 11/26/19 0144)  ketorolac (TORADOL) 15 MG/ML injection 15 mg (15 mg Intravenous Given 11/26/19 0151)   3:26 AM Headache significantly improved after IV fluids and medications.   PROCEDURES  Procedures   ED DIAGNOSES     ICD-10-CM   1. Bad headache  R51.9        Molpus, John, MD 11/26/19 [phone]

## 2019-11-26 NOTE — ED Notes (Signed)
CBG 215 

## 2019-11-27 ENCOUNTER — Ambulatory Visit (HOSPITAL_COMMUNITY)
Admission: EM | Admit: 2019-11-27 | Discharge: 2019-11-27 | Disposition: A | Discharge status: Home / Self Care | Payer: HRSA Program | Attending: Family Medicine | Admitting: Family Medicine

## 2019-11-27 ENCOUNTER — Other Ambulatory Visit: Payer: Self-pay

## 2019-11-27 ENCOUNTER — Encounter (HOSPITAL_COMMUNITY): Payer: Self-pay

## 2019-11-27 DIAGNOSIS — Z794 Long term (current) use of insulin: Secondary | ICD-10-CM | POA: Diagnosis not present

## 2019-11-27 DIAGNOSIS — R519 Headache, unspecified: Secondary | ICD-10-CM | POA: Diagnosis present

## 2019-11-27 DIAGNOSIS — E119 Type 2 diabetes mellitus without complications: Secondary | ICD-10-CM | POA: Diagnosis not present

## 2019-11-27 DIAGNOSIS — Z79899 Other long term (current) drug therapy: Secondary | ICD-10-CM | POA: Insufficient documentation

## 2019-11-27 DIAGNOSIS — U071 COVID-19: Secondary | ICD-10-CM | POA: Insufficient documentation

## 2019-11-27 DIAGNOSIS — I10 Essential (primary) hypertension: Secondary | ICD-10-CM | POA: Diagnosis not present

## 2019-11-27 MED ORDER — DEXAMETHASONE SODIUM PHOSPHATE 10 MG/ML IJ SOLN
INTRAMUSCULAR | Status: AC
  Filled 2019-11-27: qty 1

## 2019-11-27 MED ORDER — KETOROLAC TROMETHAMINE 30 MG/ML IJ SOLN
30.0000 mg | Freq: Once | INTRAMUSCULAR | Status: AC
  Administered 2019-11-27: 30 mg via INTRAMUSCULAR

## 2019-11-27 MED ORDER — DEXAMETHASONE SODIUM PHOSPHATE 10 MG/ML IJ SOLN
10.0000 mg | Freq: Once | INTRAMUSCULAR | Status: AC
  Administered 2019-11-27: 14:00:00 10 mg via INTRAMUSCULAR

## 2019-11-27 MED ORDER — KETOROLAC TROMETHAMINE 30 MG/ML IJ SOLN
INTRAMUSCULAR | Status: AC
  Filled 2019-11-27: qty 1

## 2019-11-27 NOTE — Discharge Instructions (Addendum)
Medication given here for headache.  If your symptoms do not improve or worsen you need to go back to the ER We have also tested you for COVID. This could be causing the headache.  You can take 600 mg of ibuprofen and 1000 mg of tylenol every 8 hours for pain.  Follow up as needed for continued or worsening symptoms  Your blood sugars have also been running high. This could be the cause of your headache also. Make sure that you are drinking lot of water and watching your diet.

## 2019-11-27 NOTE — ED Triage Notes (Signed)
Patient following up from ED trip yesterday. States HA has not gotten better, rating her pain at 10/10 and states she thinks her BP is elevated.

## 2019-11-28 LAB — SARS CORONAVIRUS 2 (TAT 6-24 HRS): SARS Coronavirus 2: POSITIVE — AB

## 2019-11-28 NOTE — ED Provider Notes (Signed)
[place]    CSN: [id number] Arrival date & time: 11/27/19  1213      History   Chief Complaint Chief Complaint  Patient presents with  . Headache  . Hypertension    HPI Lisa [name] is a 39 y.o. female.   Pt is a 39 year old female that presents with headache. She was seen yesterday in the ER for the same. Was given migraine cocktail without much relief. Reporting 10/10 headache.  No head injuries.  No associated fever, chills, nasal congestion, rhinorrhea, nausea, vomiting, blurred vision, dizziness, weakness.  The headache is located to the frontal area.  Describes as pressure.  Mild photophobia.  ROS per HPI      Past Medical History:  Diagnosis Date  . Gestational diabetes     Patient Active Problem List   Diagnosis Date Noted  . Type 2 diabetes mellitus without complication, without long-term current use of insulin ([place]) 09/22/2019  . Low back pain 02/28/2019  . Supervision of high-risk pregnancy 08/15/2012  . Short cervix, antepartum 08/08/2012  . Twin gestation, dichorionic diamniotic 07/18/2012  . Gestational diabetes mellitus, antepartum 07/18/2012    History reviewed. No pertinent surgical history.  OB History    Gravida  1   Para  1   Term  0   Preterm  1   AB  0   Living  2     SAB  0   TAB  0   Ectopic  0   Multiple  1   Live Births  2            Home Medications    Prior to Admission medications   Medication Sig Start Date End Date Taking? Authorizing Provider  atorvastatin (LIPITOR) 40 MG tablet Take 1 tablet (40 mg total) by mouth daily. 09/22/19   Gildardo Pounds, NP  Blood Glucose Monitoring Suppl (TRUE METRIX METER) w/Device KIT Use as instructed. Check blood glucose level by fingerstick twice per day. 06/27/19   Gildardo Pounds, NP  cyclobenzaprine (FLEXERIL) 5 MG tablet Take 1 tablet (5 mg total) by mouth 3 (three) times daily as needed for muscle spasms. 09/24/19   Gildardo Pounds, NP    glucose blood (TRUE METRIX BLOOD GLUCOSE TEST) test strip Use as instructed 06/27/19   Gildardo Pounds, NP  ibuprofen (ADVIL) 600 MG tablet Take 1 tablet (600 mg total) by mouth every 8 (eight) hours as needed. 09/22/19   Gildardo Pounds, NP  insulin glargine (LANTUS) 100 UNIT/ML Solostar Pen Inject 28 Units into the skin daily at 10 pm. 11/24/19 02/28/20  Charlott Rakes, MD  Insulin Pen Needle (B-D UF III MINI PEN NEEDLES) 31G X 5 MM MISC Use as instructed. Inject into the skin once nightly. 09/22/19   Gildardo Pounds, NP  metFORMIN (GLUCOPHAGE) 500 MG tablet Take 2 tablets (1,000 mg total) by mouth 2 (two) times daily with a meal. 11/24/19   Gildardo Pounds, NP  TRUEplus Lancets 28G MISC Use as instructed. Check blood glucose level by fingerstick twice per day. 06/27/19   Gildardo Pounds, NP    Family History Family History  Problem Relation Age of Onset  . Diabetes Maternal Grandmother   . Diabetes Maternal Grandfather   . Other Neg Hx     Social History Social History   Tobacco Use  . Smoking status: Never Smoker  . Smokeless tobacco: Never Used  Substance Use Topics  . Alcohol use: No  .  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   Review of Systems Review of Systems   Physical Exam Triage Vital Signs ED Triage Vitals  Enc Vitals Group     BP 11/27/19 1240 121/90     Pulse Rate 11/27/19 1240 89     Resp 11/27/19 1240 18     Temp --      Temp src --      SpO2 11/27/19 1240 100 %     Weight --      Height --      Head Circumference --      Peak Flow --      Pain Score 11/27/19 1238 10     Pain Loc --      Pain Edu? --      Excl. in [place]? --    No data found.  Updated Vital Signs BP 121/90 (BP Location: Right Arm)   Pulse 89   Resp 18   LMP 11/11/2019   SpO2 100%   Visual Acuity Right Eye Distance:   Left Eye Distance:   Bilateral Distance:    Right Eye Near:   Left Eye Near:     Bilateral Near:     Physical Exam Vitals and nursing note reviewed.  Constitutional:      General: She is not in acute distress.    Appearance: Normal appearance. She is not ill-appearing, toxic-appearing or diaphoretic.  HENT:     Head: Normocephalic.     Nose: Nose normal.     Mouth/Throat:     Pharynx: Oropharynx is clear.  Eyes:     Extraocular Movements: Extraocular movements intact.     Conjunctiva/sclera: Conjunctivae normal.     Pupils: Pupils are equal, round, and reactive to light.  Pulmonary:     Effort: Pulmonary effort is normal.  Abdominal:     Palpations: Abdomen is soft.     Tenderness: There is no abdominal tenderness.  Musculoskeletal:        General: Normal range of motion.     Cervical back: Normal range of motion.  Skin:    General: Skin is warm and dry.     Findings: No rash.  Neurological:     General: No focal deficit present.     Mental Status: She is alert.  Psychiatric:        Mood and Affect: Mood normal.      UC Treatments / Results  Labs (all labs ordered are listed, but only abnormal results are displayed) Labs Reviewed  SARS CORONAVIRUS 2 (TAT 6-24 HRS) - Abnormal; Notable for the following components:      Result Value   SARS Coronavirus 2 POSITIVE (*)    All other components within normal limits    EKG   Radiology No results found.  Procedures Procedures (including critical care time)  Medications Ordered in UC Medications  ketorolac (TORADOL) 30 MG/ML injection 30 mg (30 mg Intramuscular Given 11/27/19 1351)  dexamethasone (DECADRON) injection 10 mg (10 mg Intramuscular Given 11/27/19 1351)    Initial Impression / Assessment and Plan / UC Course  I have reviewed the triage vital signs and the nursing notes.  Pertinent labs & imaging results that were available during my care of the patient were reviewed by me and considered in my medical decision making (see chart for details).     Bad headache-giving Decadron and  Toradol here Covid  swab sent for testing, highly suspicious. Recommend rest, decrease screen time Ibuprofen and Tylenol as needed at home Return precautions given  Final Clinical Impressions(s) / UC Diagnoses   Final diagnoses:  Bad headache     Discharge Instructions     Medication given here for headache.  If your symptoms do not improve or worsen you need to go back to the ER We have also tested you for COVID. This could be causing the headache.  You can take 600 mg of ibuprofen and 1000 mg of tylenol every 8 hours for pain.  Follow up as needed for continued or worsening symptoms  Your blood sugars have also been running high. This could be the cause of your headache also. Make sure that you are drinking lot of water and watching your diet.      ED Prescriptions    None     PDMP not reviewed this encounter.   Orvan July, NP 11/28/19 [phone]

## 2019-11-29 ENCOUNTER — Ambulatory Visit (HOSPITAL_COMMUNITY)
Admission: RE | Admit: 2019-11-29 | Discharge: 2019-11-29 | Disposition: A | Discharge status: Home / Self Care | Payer: HRSA Program | Source: Ambulatory Visit | Attending: Pulmonary Disease | Admitting: Pulmonary Disease

## 2019-11-29 ENCOUNTER — Encounter: Payer: Self-pay | Admitting: Physician Assistant

## 2019-11-29 ENCOUNTER — Other Ambulatory Visit: Payer: Self-pay | Admitting: Physician Assistant

## 2019-11-29 ENCOUNTER — Ambulatory Visit: Payer: Self-pay | Attending: Nurse Practitioner

## 2019-11-29 ENCOUNTER — Other Ambulatory Visit: Payer: Self-pay

## 2019-11-29 DIAGNOSIS — U071 COVID-19: Secondary | ICD-10-CM

## 2019-11-29 DIAGNOSIS — E669 Obesity, unspecified: Secondary | ICD-10-CM | POA: Insufficient documentation

## 2019-11-29 DIAGNOSIS — E119 Type 2 diabetes mellitus without complications: Secondary | ICD-10-CM | POA: Insufficient documentation

## 2019-11-29 DIAGNOSIS — E1169 Type 2 diabetes mellitus with other specified complication: Secondary | ICD-10-CM

## 2019-11-29 MED ORDER — FAMOTIDINE IN NACL 20-0.9 MG/50ML-% IV SOLN: 20.0000 mg | Freq: Once | INTRAVENOUS | Status: DC | PRN

## 2019-11-29 MED ORDER — SODIUM CHLORIDE 0.9 % IV SOLN
INTRAVENOUS | Status: DC | PRN
  Administered 2019-11-29: 250 mL via INTRAVENOUS

## 2019-11-29 MED ORDER — EPINEPHRINE 0.3 MG/0.3ML IJ SOAJ: 0.3000 mg | Freq: Once | INTRAMUSCULAR | Status: DC | PRN

## 2019-11-29 MED ORDER — METHYLPREDNISOLONE SODIUM SUCC 125 MG IJ SOLR: 125.0000 mg | Freq: Once | INTRAMUSCULAR | Status: DC | PRN

## 2019-11-29 MED ORDER — DIPHENHYDRAMINE HCL 50 MG/ML IJ SOLN: 50.0000 mg | Freq: Once | INTRAMUSCULAR | Status: DC | PRN

## 2019-11-29 MED ORDER — ALBUTEROL SULFATE HFA 108 (90 BASE) MCG/ACT IN AERS: 2.0000 | INHALATION_SPRAY | Freq: Once | RESPIRATORY_TRACT | Status: DC | PRN

## 2019-11-29 MED ORDER — SODIUM CHLORIDE 0.9 % IV SOLN
700.0000 mg | Freq: Once | INTRAVENOUS | Status: AC
  Administered 2019-11-29: 700 mg via INTRAVENOUS
  Filled 2019-11-29: qty 700

## 2019-11-29 NOTE — Discharge Instructions (Signed)

## 2019-11-29 NOTE — Progress Notes (Signed)
Patient ID: Lisa [name], female   DOB: 07/21/1981, 39 y.o.   MRN: [id number]    Diagnosis: COVID-19  Physician: Dr. Delford Field  Procedure: Covid Infusion [facility] Med: bamlanivimab infusion - Provided patient with bamlanimivab fact sheet for patients, parents and caregivers prior to infusion.  Complications: No immediate complications noted.  Discharge: Discharged home   Jashad Depaula, Luetta Nutting 11/29/2019

## 2019-11-29 NOTE — Progress Notes (Signed)
  I connected by phone with Lisa [name] on 11/29/2019 at 10:05 AM to discuss the potential use of an new treatment for mild to moderate COVID-19 viral infection in non-hospitalized patients.  This patient is a 39 y.o. female that meets the FDA criteria for Emergency Use Authorization of bamlanivimab or casirivimab\imdevimab.  Has a (+) direct SARS-CoV-2 viral test result  Has mild or moderate COVID-19   Is ? 39 years of age and weighs ? 40 kg  Is NOT hospitalized due to COVID-19  Is NOT requiring oxygen therapy or requiring an increase in baseline oxygen flow rate due to COVID-19  Is within 10 days of symptom onset  Has at least one of the high risk factor(s) for progression to severe COVID-19 and/or hospitalization as defined in EUA.  Specific high risk criteria : Diabetes and BMI >35   I have spoken and communicated the following to the patient or parent/caregiver:  1. FDA has authorized the emergency use of bamlanivimab and casirivimab\imdevimab for the treatment of mild to moderate COVID-19 in adults and pediatric patients with positive results of direct SARS-CoV-2 viral testing who are 53 years of age and older weighing at least 40 kg, and who are at high risk for progressing to severe COVID-19 and/or hospitalization.  2. The significant known and potential risks and benefits of bamlanivimab and casirivimab\imdevimab, and the extent to which such potential risks and benefits are unknown.  3. Information on available alternative treatments and the risks and benefits of those alternatives, including clinical trials.  4. Patients treated with bamlanivimab and casirivimab\imdevimab should continue to self-isolate and use infection control measures (e.g., wear mask, isolate, social distance, avoid sharing personal items, clean and disinfect "high touch" surfaces, and frequent handwashing) according to CDC guidelines.   5. The patient or parent/caregiver has the option to accept  or refuse bamlanivimab or casirivimab\imdevimab .  After reviewing this information with the patient, The patient agreed to proceed with receiving the bamlanimivab infusion and will be provided a copy of the Fact sheet prior to receiving the infusion.   Sx onset 3/26. Set up for mab infusion today, 3/31, at 12:30pm. Directions given. A sudanese interpreter was used.   Cline Crock PA-C 11/29/2019 10:05 AM

## 2019-12-22 ENCOUNTER — Ambulatory Visit (HOSPITAL_COMMUNITY)
Admission: EM | Admit: 2019-12-22 | Discharge: 2019-12-22 | Disposition: A | Discharge status: Home / Self Care | Payer: Self-pay

## 2019-12-22 ENCOUNTER — Other Ambulatory Visit: Payer: Self-pay

## 2019-12-22 ENCOUNTER — Encounter (HOSPITAL_COMMUNITY): Payer: Self-pay

## 2019-12-22 DIAGNOSIS — R519 Headache, unspecified: Secondary | ICD-10-CM | POA: Insufficient documentation

## 2019-12-22 DIAGNOSIS — R109 Unspecified abdominal pain: Secondary | ICD-10-CM | POA: Insufficient documentation

## 2019-12-22 DIAGNOSIS — M79602 Pain in left arm: Secondary | ICD-10-CM | POA: Insufficient documentation

## 2019-12-22 DIAGNOSIS — R002 Palpitations: Secondary | ICD-10-CM | POA: Insufficient documentation

## 2019-12-22 HISTORY — DX: COVID-19: U07.1

## 2019-12-22 LAB — CBC
HCT: 42.3 % (ref 36.0–46.0)
Hemoglobin: 13.3 g/dL (ref 12.0–15.0)
MCH: 24.3 pg — ABNORMAL LOW (ref 26.0–34.0)
MCHC: 31.4 g/dL (ref 30.0–36.0)
MCV: 77.2 fL — ABNORMAL LOW (ref 80.0–100.0)
Platelets: 269 10*3/uL (ref 150–400)
RBC: 5.48 MIL/uL — ABNORMAL HIGH (ref 3.87–5.11)
RDW: 14.7 % (ref 11.5–15.5)
WBC: 3.8 10*3/uL — ABNORMAL LOW (ref 4.0–10.5)
nRBC: 0 % (ref 0.0–0.2)

## 2019-12-22 MED ORDER — ACETAMINOPHEN 500 MG PO TABS: 1000.0000 mg | ORAL_TABLET | Freq: Four times a day (QID) | ORAL | 0 refills | Status: AC | PRN

## 2019-12-22 MED ORDER — IBUPROFEN 600 MG PO TABS: 600.0000 mg | ORAL_TABLET | Freq: Three times a day (TID) | ORAL | 1 refills | Status: DC | PRN

## 2019-12-22 NOTE — ED Triage Notes (Signed)
Interpreter used via [employer], Dois Davenport [phone].    Pt states she had J&J vaccine on 12/11/2019.  Had COVID two weeks prior.  Reports return of s/s.  Has HA, CP, fatigue.  Any small activity makes her very tired.  L arm pain since this am.  Pt states the CP is the "heart speeding".  Denies nausea, dizziness, SOB.

## 2019-12-22 NOTE — ED Provider Notes (Addendum)
[place]    CSN: [id number] Arrival date & time: 12/22/19  1341      History   Chief Complaint No chief complaint on file.   HPI Lisa [name] is a 39 y.o. female.   An Arabic interpreter was utilized for the duration of this visit  Patient reports urgent care for evaluation of headache, fast heart rate and left arm pain.  She also reports fatigue.  She reports the symptoms have been present over the last roughly 2 weeks.  Important to note she received the [employer] vaccine on 12/11/2019 and was also diagnosed with COVID-19 on 11/27/2019.  She reports prior to receiving the vaccine that she had nearly fully recovered from Covid.  She reports beginning after receiving the [employer] vaccine on 12/11/2019 she began having intermittent headaches with the worst being up to 6/10.  The headache does reside at times.  She denies photophobia.  Reports the pain is in the front of her head.  Denies nausea or vomiting with the headache.  Denies weakness or tingling.  Per chart review her initial symptom presentation for Covid was also headache.  She also reports that she has been having a " heart racing" sensation when being active since getting the Covid vaccine.  She had initially described this as chest pain however on more clarification it is more accurately described as fast heart rate.  She does not get short of breath.  She reports that this occurs when active.  She denies this at rest.  She reports it is intermittent.  She reports fatigue with exertion after receiving the vaccine.  Denies fever and chills.  Denies nausea or vomiting.  Denies cough or sore throat.  She reports she came in today out of concern of left arm pain and knowing that the [employer] had been pulled off the market.  She reports and points to pain along the anterior part of her left bicep and forearm.  She reports pain is worse with movement.  It is difficult to get a quality  statement due to translation.  She denies any significant trauma recently.  Denies seeing of heavy lifting.  Denies any swelling in the arm.     Past Medical History:  Diagnosis Date  . COVID-19   . Diabetes mellitus ([place])   . Morbid obesity [facility])     Patient Active Problem List   Diagnosis Date Noted  . Morbid obesity ([place])   . Diabetes mellitus ([place])   . Type 2 diabetes mellitus without complication, without long-term current use of insulin ([place]) 09/22/2019  . Low back pain 02/28/2019  . Supervision of high-risk pregnancy 08/15/2012  . Short cervix, antepartum 08/08/2012  . Twin gestation, dichorionic diamniotic 07/18/2012  . Gestational diabetes mellitus, antepartum 07/18/2012    History reviewed. No pertinent surgical history.  OB History    Gravida  1   Para  1   Term  0   Preterm  1   AB  0   Living  2     SAB  0   TAB  0   Ectopic  0   Multiple  1   Live Births  2            Home Medications    Prior to Admission medications   Medication Sig Start Date End Date Taking? Authorizing Provider  atorvastatin (LIPITOR) 40 MG tablet Take 1 tablet (40 mg total) by mouth daily. 09/22/19  Yes Gildardo Pounds, NP  Blood Glucose Monitoring Suppl (TRUE METRIX METER) w/Device KIT Use as instructed. Check blood glucose level by fingerstick twice per day. 06/27/19  Yes Gildardo Pounds, NP  glucose blood (TRUE METRIX BLOOD GLUCOSE TEST) test strip Use as instructed 06/27/19  Yes Gildardo Pounds, NP  insulin glargine (LANTUS) 100 UNIT/ML Solostar Pen Inject 28 Units into the skin daily at 10 pm. 11/24/19 02/28/20 Yes Newlin, Charlane Ferretti, MD  Insulin Pen Needle (B-D UF III MINI PEN NEEDLES) 31G X 5 MM MISC Use as instructed. Inject into the skin once nightly. 09/22/19  Yes Gildardo Pounds, NP  metFORMIN (GLUCOPHAGE) 500 MG tablet Take 2 tablets (1,000 mg total) by mouth 2 (two) times daily with a meal. 11/24/19  Yes Gildardo Pounds, NP  TRUEplus Lancets 28G MISC Use  as instructed. Check blood glucose level by fingerstick twice per day. 06/27/19  Yes Gildardo Pounds, NP  acetaminophen (TYLENOL) 500 MG tablet Take 2 tablets (1,000 mg total) by mouth every 6 (six) hours as needed. 12/22/19   Charlann Wayne, Marguerita Beards, PA-C  cyclobenzaprine (FLEXERIL) 5 MG tablet Take 1 tablet (5 mg total) by mouth 3 (three) times daily as needed for muscle spasms. 09/24/19   Gildardo Pounds, NP  ibuprofen (ADVIL) 600 MG tablet Take 1 tablet (600 mg total) by mouth every 8 (eight) hours as needed. 12/22/19   Jawon Dipiero, Marguerita Beards, PA-C    Family History Family History  Problem Relation Age of Onset  . Diabetes Maternal Grandmother   . Diabetes Maternal Grandfather   . Other Neg Hx     Social History Social History   Tobacco Use  . Smoking status: Never Smoker  . Smokeless tobacco: Never Used  Substance Use Topics  . Alcohol use: No  . 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   Review of Systems Review of Systems  Per HPI Physical Exam Triage Vital Signs ED Triage Vitals  Enc Vitals Group     BP 12/22/19 1420 (!) 138/96     Pulse Rate 12/22/19 1420 84     Resp 12/22/19 1420 18     Temp 12/22/19 1420 98.7 F (37.1 C)     Temp Source 12/22/19 1420 Oral     SpO2 12/22/19 1420 98 %     Weight --      Height --      Head Circumference --      Peak Flow --      Pain Score 12/22/19 1415 0     Pain Loc --      Pain Edu? --      Excl. in [place]? --    No data found.  Updated Vital Signs BP (!) 138/96 (BP Location: Right Arm)   Pulse 84   Temp 98.7 F (37.1 C) (Oral)   Resp 18   LMP 12/08/2019 (Exact Date)   SpO2 98%   Visual Acuity Right Eye Distance:   Left Eye Distance:   Bilateral Distance:    Right Eye Near:   Left Eye Near:    Bilateral Near:     Physical Exam Vitals and nursing note reviewed.  Constitutional:      General: She is not in acute distress.    Appearance: She is well-developed.  She is not ill-appearing.  HENT:     Head: Normocephalic and atraumatic.  Eyes:  Extraocular Movements: Extraocular movements intact.     Conjunctiva/sclera: Conjunctivae normal.     Pupils: Pupils are equal, round, and reactive to light.  Cardiovascular:     Rate and Rhythm: Normal rate and regular rhythm.     Heart sounds: No murmur.  Pulmonary:     Effort: Pulmonary effort is normal. No respiratory distress.     Breath sounds: Normal breath sounds.  Musculoskeletal:     Cervical back: Neck supple.     Right lower leg: No edema.     Left lower leg: No edema.     Comments: Left arm with tenderness to palpation over the bicep and biceps tendon insertion at the elbow.  There is no swelling.  Pulses 2+.  Skin:    General: Skin is warm and dry.  Neurological:     General: No focal deficit present.     Mental Status: She is alert and oriented to person, place, and time.      UC Treatments / Results  Labs (all labs ordered are listed, but only abnormal results are displayed) Labs Reviewed  CBC - Abnormal; Notable for the following components:      Result Value   WBC 3.8 (*)    RBC 5.48 (*)    MCV 77.2 (*)    MCH 24.3 (*)    All other components within normal limits    EKG Normal sinus rhythm.  Normal EKG  Radiology No results found.  Procedures Procedures (including critical care time)  Medications Ordered in UC Medications - No data to display  Initial Impression / Assessment and Plan / UC Course  I have reviewed the triage vital signs and the nursing notes.  Pertinent labs & imaging results that were available during my care of the patient were reviewed by me and considered in my medical decision making (see chart for details).  Chart review of previous visits was conducted, specifically with regard to 11/27/2019 diagnosis of COVID-19.    #Headache #Left arm pain #Palpitations Patient is a 39 year old presenting with headache, left arm pain and subjective  palpitations.  EKG without sign of arrhythmia and she is not tachycardic.  Discussed the patient needs to follow-up with her primary care for possible Holter monitor.  Left arm pain appears musculoskeletal in nature.  Given continued headache post vaccination CBC was drawn, no thrombocytosis thrombocytopenia, so doubt clot.  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  Extensive time was spent with translator to ensure patient understands the entire plan of care.  She does verbalize understanding the plan. -Tylenol and ibuprofen for headache. Final Clinical Impressions(s) / UC Diagnoses   Final diagnoses:  Left arm pain  Nonintractable headache, unspecified chronicity pattern, unspecified headache type  Palpitations     Discharge Instructions     We have sent basic lab work, we will call you if anything requires attention, otherwise your results will be in your MyChart.  Your ECG was normal today, I would like for you to follow up with your Primary care about the fast heart rate sensation  I think you your fatigue could be related to your recent COVID infection, I want you to follow up with your Primary care in 1 week for re-evaluation  Take 2 extra strength tylenol and 1 of the ibuprofen for headache and arm pain  If  headache gets severe, worsening fast heart rate with chest pain or shortness of breath, please go to the Emergency Room    ED Prescriptions    Medication Sig Dispense Auth. Provider   acetaminophen (TYLENOL) 500 MG tablet Take 2 tablets (1,000 mg total) by mouth every 6 (six) hours as needed. 30 tablet Candace Begue, Marguerita Beards, PA-C   ibuprofen (ADVIL) 600 MG tablet Take 1 tablet (600 mg total) by mouth every 8 (eight) hours as needed. 60 tablet Alaster Asfaw, Marguerita Beards, PA-C     PDMP not reviewed this encounter.   Purnell Shoemaker,  PA-C 12/22/19 2254    Annaleah Arata, Marguerita Beards, PA-C 12/22/19 2255

## 2019-12-22 NOTE — Discharge Instructions (Signed)
We have sent basic lab work, we will call you if anything requires attention, otherwise your results will be in your MyChart.  Your ECG was normal today, I would like for you to follow up with your Primary care about the fast heart rate sensation  I think you your fatigue could be related to your recent COVID infection, I want you to follow up with your Primary care in 1 week for re-evaluation  Take 2 extra strength tylenol and 1 of the ibuprofen for headache and arm pain  If headache gets severe, worsening fast heart rate with chest pain or shortness of breath, please go to the Emergency Room

## 2019-12-26 ENCOUNTER — Other Ambulatory Visit: Payer: Self-pay

## 2019-12-26 ENCOUNTER — Ambulatory Visit: Payer: Self-pay | Attending: Nurse Practitioner | Admitting: Nurse Practitioner

## 2019-12-26 ENCOUNTER — Encounter: Payer: Self-pay | Admitting: Nurse Practitioner

## 2019-12-26 DIAGNOSIS — G44229 Chronic tension-type headache, not intractable: Secondary | ICD-10-CM

## 2019-12-26 DIAGNOSIS — IMO0002 Reserved for concepts with insufficient information to code with codable children: Secondary | ICD-10-CM

## 2019-12-26 DIAGNOSIS — E785 Hyperlipidemia, unspecified: Secondary | ICD-10-CM

## 2019-12-26 DIAGNOSIS — E118 Type 2 diabetes mellitus with unspecified complications: Secondary | ICD-10-CM

## 2019-12-26 DIAGNOSIS — E1165 Type 2 diabetes mellitus with hyperglycemia: Secondary | ICD-10-CM

## 2019-12-26 MED ORDER — TRUE METRIX BLOOD GLUCOSE TEST VI STRP: ORAL_STRIP | 12 refills | Status: DC

## 2019-12-26 MED ORDER — METFORMIN HCL 500 MG PO TABS: 1000.0000 mg | ORAL_TABLET | Freq: Two times a day (BID) | ORAL | 2 refills | Status: DC

## 2019-12-26 MED ORDER — CYCLOBENZAPRINE HCL 5 MG PO TABS: 5.0000 mg | ORAL_TABLET | Freq: Three times a day (TID) | ORAL | 1 refills | Status: DC | PRN

## 2019-12-26 MED ORDER — TRUEPLUS LANCETS 28G MISC: 6 refills | Status: DC

## 2019-12-26 MED ORDER — TOPIRAMATE 25 MG PO TABS: 25.0000 mg | ORAL_TABLET | Freq: Two times a day (BID) | ORAL | 2 refills | Status: DC

## 2019-12-26 MED ORDER — BD PEN NEEDLE MINI U/F 31G X 5 MM MISC: 6 refills | Status: DC

## 2019-12-26 MED ORDER — ATORVASTATIN CALCIUM 40 MG PO TABS: 40.0000 mg | ORAL_TABLET | Freq: Every day | ORAL | 3 refills | Status: DC

## 2019-12-26 MED ORDER — INSULIN GLARGINE 100 UNIT/ML SOLOSTAR PEN: 28.0000 [IU] | PEN_INJECTOR | Freq: Every day | SUBCUTANEOUS | 2 refills | Status: DC

## 2019-12-26 MED FILL — CYCLOBENZAPRINE 5 MG TABLET: 5 | 10 days supply | Qty: 30 | Fill #0

## 2019-12-26 MED FILL — TRUE METRIX TEST STRIP: 30 days supply | Qty: 100 | Fill #0

## 2019-12-26 MED FILL — TRUEPLUS PEN NDL 32GX5/32: 32G X 4 MM | 30 days supply | Qty: 100 | Fill #0

## 2019-12-26 MED FILL — ?TOPIRAMATE 25 MG TABS: 25 | 30 days supply | Qty: 60 | Fill #0

## 2019-12-26 MED FILL — TRUEplus LANCETS 28G MISC: 30 days supply | Qty: 100 | Fill #0

## 2019-12-26 MED FILL — ?BASAGLAR 100 UNITS/ML KWPE: 100 | 32 days supply | Qty: 9 | Fill #0

## 2019-12-26 MED FILL — ?ATORVASTATIN 40MG TABLET: 40 | 30 days supply | Qty: 30 | Fill #0

## 2019-12-26 MED FILL — METFORMIN HCL 500 MG TABS: 500 | 30 days supply | Qty: 120 | Fill #0

## 2019-12-26 NOTE — Progress Notes (Signed)
Virtual Visit via Telephone Note Due to national recommendations of social distancing due to [place] 19, telehealth visit is felt to be most appropriate for this patient at this time.  I discussed the limitations, risks, security and privacy concerns of performing an evaluation and management service by telephone and the availability of in person appointments. I also discussed with the patient that there may be a patient responsible charge related to this service. The patient expressed understanding and agreed to proceed.    I connected with Lisa [name] on 12/26/19  at   8:30 AM EDT  EDT by telephone and verified that I am speaking with the correct person using two identifiers.   Consent I discussed the limitations, risks, security and privacy concerns of performing an evaluation and management service by telephone and the availability of in person appointments. I also discussed with the patient that there may be a patient responsible charge related to this service. The patient expressed understanding and agreed to proceed.   Location of Patient: Private Residence   Location of Provider: [place] [facility] [place] participating in Telemedicine visit: Geryl Rankins FNP-BC [place]  Arabic Interpreter ID# [place] Axe [phone]   History of Present Illness: Telemedicine visit for: DM TYPE 2   DM TYPE 2 Only monitoring twice a day due to Ramadan. Blood glucose 100 this morning. Post prandial reading yesterday: 170. She does endorse headaches.  Lab Results  Component Value Date   HGBA1C 8.3 (A) 09/22/2019    Headaches Currently taking ibuprofen for her headaches which provide significant relief of headaches. Headaches are frontal and occurring daily.   Past Medical History:  Diagnosis Date  . COVID-19   . Diabetes mellitus ([place])   . Morbid obesity ([place])     History reviewed. No pertinent surgical history.  Family History   Problem Relation Age of Onset  . Diabetes Maternal Grandmother   . Diabetes Maternal Grandfather   . Other Neg Hx     Social History   Socioeconomic History  . Marital status: Married    Spouse name: Not on file  . Number of children: Not on file  . Years of education: Not on file  . Highest education level: Not on file  Occupational History  . Not on file  Tobacco Use  . Smoking status: Never Smoker  . Smokeless tobacco: Never Used  Substance and Sexual Activity  . Alcohol use: No  . 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 Sexual activity: Not Currently  Other Topics Concern  . Not on file  Social History Narrative  . Not on file   Social Determinants of Health   Financial Resource Strain:   . Difficulty of Paying Living Expenses:   Food Insecurity:   . Worried About Charity fundraiser in the Last Year:   . Arboriculturist in the Last Year:   Transportation Needs:   . Film/video editor (Medical):   Marland Kitchen Lack of Transportation (Non-Medical):   Physical Activity:   . Days of Exercise per Week:   . Minutes of Exercise per Session:   Stress:   . Feeling of Stress :   Social Connections:   . Frequency of Communication with Friends and Family:   . Frequency of Social Gatherings with Friends and Family:   . Attends Religious Services:   . Active Member of Clubs or Organizations:   . Attends Club  or Organization Meetings:   Marland Kitchen Marital Status:      Observations/Objective: Awake, alert and oriented x 3   Review of Systems  Constitutional: Positive for malaise/fatigue. Negative for fever and weight loss.  HENT: Negative.  Negative for nosebleeds.   Eyes: Negative.  Negative for blurred vision, double vision and photophobia.  Respiratory: Negative.  Negative for cough and shortness of breath.   Cardiovascular: Negative.  Negative for chest pain, palpitations and leg swelling.  Gastrointestinal: Negative.  Negative for heartburn, nausea and  vomiting.  Musculoskeletal: Negative.  Negative for myalgias.  Neurological: Positive for headaches. Negative for dizziness, focal weakness and seizures.  Psychiatric/Behavioral: Negative.  Negative for suicidal ideas.    Assessment and Plan: Emmalea was seen today for follow-up.  Diagnoses and all orders for this visit:  Diabetes mellitus type 2, uncontrolled, with complications ([place]) -     Ambulatory referral to Ophthalmology -     Microalbumin / creatinine urine ratio; Future -     Hemoglobin A1c; Future -     insulin glargine (LANTUS) 100 UNIT/ML Solostar Pen; Inject 28 Units into the skin daily at 10 pm. -     glucose blood (TRUE METRIX BLOOD GLUCOSE TEST) test strip; Use as instructed -     metFORMIN (GLUCOPHAGE) 500 MG tablet; Take 2 tablets (1,000 mg total) by mouth 2 (two) times daily with a meal. -     Insulin Pen Needle (B-D UF III MINI PEN NEEDLES) 31G X 5 MM MISC; Use as instructed. Inject into the skin once nightly. -     TRUEplus Lancets 28G MISC; Use as instructed. Check blood glucose level by fingerstick twice per day. Continue blood sugar control as discussed in office today, low carbohydrate diet, and regular physical exercise as tolerated, 150 minutes per week (30 min each day, 5 days per week, or 50 min 3 days per week). Keep blood sugar logs with fasting goal of 90-130 mg/dl, post prandial (after you eat) less than 180.  For Hypoglycemia: BS <60 and Hyperglycemia BS >400; contact the clinic ASAP. Annual eye exams and foot exams are recommended.   Chronic tension-type headache, not intractable -     CMP14+EGFR; Future -     topiramate (TOPAMAX) 25 MG tablet; Take 1 tablet (25 mg total) by mouth 2 (two) times daily. -     cyclobenzaprine (FLEXERIL) 5 MG tablet; Take 1 tablet (5 mg total) by mouth 3 (three) times daily as needed for muscle spasms. Avoid caffeine, increase water intake.   Dyslipidemia, goal LDL below 70 -     Lipid panel; Future -     atorvastatin  (LIPITOR) 40 MG tablet; Take 1 tablet (40 mg total) by mouth daily. LDL not at goal of <70 INSTRUCTIONS: Work on a low fat, heart healthy diet and participate in regular aerobic exercise program by working out at least 150 minutes per week; 5 days a week-30 minutes per day. Avoid red meat/beef/steak,  fried foods. junk foods, sodas, sugary drinks, unhealthy snacking, alcohol and smoking.  Drink at least 80 oz of water per day and monitor your carbohydrate intake daily.   Lab Results  Component Value Date   LDLCALC 82 06/26/2019     Follow Up Instructions Return in about 3 months (around 03/26/2020).     I discussed the assessment and treatment plan with the patient. The patient was provided an opportunity to ask questions and all were answered. The patient agreed with the plan and demonstrated an  understanding of the instructions.   The patient was advised to call back or seek an in-person evaluation if the symptoms worsen or if the condition fails to improve as anticipated.  I provided 20 minutes of non-face-to-face time during this encounter including median intraservice time, reviewing previous notes, labs, imaging, medications and explaining diagnosis and management.  Gildardo Pounds, FNP-BC

## 2020-01-01 ENCOUNTER — Other Ambulatory Visit: Payer: Self-pay

## 2020-02-07 MED FILL — ?BASAGLAR 100 UNITS/ML KWPE: 100 | 32 days supply | Qty: 9 | Fill #1

## 2020-02-28 ENCOUNTER — Encounter (HOSPITAL_COMMUNITY): Payer: Self-pay

## 2020-02-28 ENCOUNTER — Other Ambulatory Visit: Payer: Self-pay

## 2020-02-28 ENCOUNTER — Ambulatory Visit (HOSPITAL_COMMUNITY)
Admission: EM | Admit: 2020-02-28 | Discharge: 2020-02-28 | Disposition: A | Discharge status: Home / Self Care | Payer: 59 | Attending: Family Medicine | Admitting: Family Medicine

## 2020-02-28 DIAGNOSIS — H6122 Impacted cerumen, left ear: Secondary | ICD-10-CM

## 2020-02-28 NOTE — ED Notes (Signed)
I used the interpreter ipad to triage pt.

## 2020-02-28 NOTE — ED Triage Notes (Signed)
Pt c/o left ear being blocked and she has ringing in that ear. PT states she has wax in her left ear that she would like cleaned out. Pt denies ear pain.

## 2020-02-29 NOTE — ED Provider Notes (Signed)
[facility]    CSN: [id number] Arrival date & time: 02/28/20  1650      History   Chief Complaint Chief Complaint  Patient presents with  . Cerumen Impaction    HPI Lisa [name] is a 39 y.o. female history of DM type II presenting today for evaluation of cerumen impaction.  Patient reports that her left ear has had decreased hearing, ringing and has had sensation of being blocked over the past few days.  She denies associated pain with it.  Denies any change in hearing on right.  Denies associated URI symptoms.  Reports she comes here approximately every 6 months to have this done.  HPI  Past Medical History:  Diagnosis Date  . COVID-19   . Diabetes mellitus ([place])   . Morbid obesity [facility])     Patient Active Problem List   Diagnosis Date Noted  . Morbid obesity ([place])   . Diabetes mellitus ([place])   . Type 2 diabetes mellitus without complication, without long-term current use of insulin ([place]) 09/22/2019  . Low back pain 02/28/2019  . Supervision of high-risk pregnancy 08/15/2012  . Short cervix, antepartum 08/08/2012  . Twin gestation, dichorionic diamniotic 07/18/2012  . Gestational diabetes mellitus, antepartum 07/18/2012    History reviewed. No pertinent surgical history.  OB History    Gravida  1   Para  1   Term  0   Preterm  1   AB  0   Living  2     SAB  0   TAB  0   Ectopic  0   Multiple  1   Live Births  2            Home Medications    Prior to Admission medications   Medication Sig Start Date End Date Taking? Authorizing Provider  acetaminophen (TYLENOL) 500 MG tablet Take 2 tablets (1,000 mg total) by mouth every 6 (six) hours as needed. 12/22/19   Darr, Marguerita Beards, PA-C  atorvastatin (LIPITOR) 40 MG tablet Take 1 tablet (40 mg total) by mouth daily. 12/26/19   Gildardo Pounds, NP  Blood Glucose Monitoring Suppl (TRUE METRIX METER) w/Device KIT Use as instructed. Check blood glucose level by fingerstick twice per  day. 06/27/19   Gildardo Pounds, NP  cyclobenzaprine (FLEXERIL) 5 MG tablet Take 1 tablet (5 mg total) by mouth 3 (three) times daily as needed for muscle spasms. 12/26/19   Gildardo Pounds, NP  glucose blood (TRUE METRIX BLOOD GLUCOSE TEST) test strip Use as instructed 12/26/19   Gildardo Pounds, NP  ibuprofen (ADVIL) 600 MG tablet Take 1 tablet (600 mg total) by mouth every 8 (eight) hours as needed. 12/22/19   Darr, Marguerita Beards, PA-C  insulin glargine (LANTUS) 100 UNIT/ML Solostar Pen Inject 28 Units into the skin daily at 10 pm. 12/26/19 03/31/20  Gildardo Pounds, NP  Insulin Pen Needle (B-D UF III MINI PEN NEEDLES) 31G X 5 MM MISC Use as instructed. Inject into the skin once nightly. 12/26/19   Gildardo Pounds, NP  metFORMIN (GLUCOPHAGE) 500 MG tablet Take 2 tablets (1,000 mg total) by mouth 2 (two) times daily with a meal. 12/26/19   Gildardo Pounds, NP  topiramate (TOPAMAX) 25 MG tablet Take 1 tablet (25 mg total) by mouth 2 (two) times daily. 12/26/19 01/25/20  Gildardo Pounds, NP  TRUEplus Lancets 28G MISC Use as instructed. Check blood glucose level by fingerstick twice per day. 12/26/19  Fleming, Zelda W, NP    Family History Family History  Problem Relation Age of Onset  . Diabetes Maternal Grandmother   . Diabetes Maternal Grandfather   . Other Neg Hx     Social History Social History   Tobacco Use  . Smoking status: Never Smoker  . Smokeless tobacco: Never Used  Vaping Use  . Vaping Use: Never used  Substance Use Topics  . Alcohol use: No  . 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   Review of Systems Review of Systems  Constitutional: Negative for activity change, appetite change, chills, fatigue and fever.  HENT: Positive for hearing loss. Negative for congestion, ear pain, rhinorrhea, sinus pressure, sore throat and trouble swallowing.   Eyes: Negative for discharge and redness.  Respiratory: Negative for  cough, chest tightness and shortness of breath.   Cardiovascular: Negative for chest pain.  Gastrointestinal: Negative for abdominal pain, diarrhea, nausea and vomiting.  Musculoskeletal: Negative for myalgias.  Skin: Negative for rash.  Neurological: Negative for dizziness, light-headedness and headaches.     Physical Exam Triage Vital Signs ED Triage Vitals  Enc Vitals Group     BP 02/28/20 1802 127/86     Pulse Rate 02/28/20 1802 97     Resp 02/28/20 1802 16     Temp 02/28/20 1802 98.3 F (36.8 C)     Temp Source 02/28/20 1802 Oral     SpO2 02/28/20 1802 100 %     Weight 02/28/20 1805 200 lb (90.7 kg)     Height 02/28/20 1805 5' 5" (1.651 m)     Head Circumference --      Peak Flow --      Pain Score 02/28/20 1805 0     Pain Loc --      Pain Edu? --      Excl. in GC? --    No data found.  Updated Vital Signs BP 127/86   Pulse 97   Temp 98.3 F (36.8 C) (Oral)   Resp 16   Ht 5' 5" (1.651 m)   Wt 200 lb (90.7 kg)   SpO2 100%   BMI 33.28 kg/m   Visual Acuity Right Eye Distance:   Left Eye Distance:   Bilateral Distance:    Right Eye Near:   Left Eye Near:    Bilateral Near:     Physical Exam Vitals and nursing note reviewed.  Constitutional:      Appearance: She is well-developed.     Comments: No acute distress  HENT:     Head: Normocephalic and atraumatic.     Ears:     Comments: Left canal with cerumen impaction, after removal TM visualized and appears intact without erythema  Right canal with partial cerumen impaction    Nose: Nose normal.     Mouth/Throat:     Comments: Oral mucosa pink and moist, no tonsillar enlargement or exudate. Posterior pharynx patent and nonerythematous, no uvula deviation or swelling. Normal phonation.  Eyes:     Conjunctiva/sclera: Conjunctivae normal.  Cardiovascular:     Rate and Rhythm: Normal rate.  Pulmonary:     Effort: Pulmonary effort is normal. No respiratory distress.  Abdominal:     General: There is  no distension.  Musculoskeletal:        General: Normal range of motion.     Cervical back: Neck supple.  Skin:    General:   Skin is warm and dry.  Neurological:     Mental Status: She is alert and oriented to person, place, and time.      UC Treatments / Results  Labs (all labs ordered are listed, but only abnormal results are displayed) Labs Reviewed - No data to display  EKG   Radiology No results found.  Procedures Procedures (including critical care time)  Medications Ordered in UC Medications - No data to display  Initial Impression / Assessment and Plan / UC Course  I have reviewed the triage vital signs and the nursing notes.  Pertinent labs & imaging results that were available during my care of the patient were reviewed by me and considered in my medical decision making (see chart for details).     Left cerumen impaction removed with combination of curette by myself and irrigation by nursing staff.  Patient had improvement of hearing and symptoms.  Advised to keep ear clean, monitor for development of any secondary infection.  Discussed strict return precautions. Patient verbalized understanding and is agreeable with plan.  Final Clinical Impressions(s) / UC Diagnoses   Final diagnoses:  Impacted cerumen of left ear   Discharge Instructions   None    ED Prescriptions    None     PDMP not reviewed this encounter.   Joneen Caraway [place] C, PA-C 02/29/20 0825

## 2020-03-13 MED FILL — ?BASAGLAR 100 UNITS/ML KWPE: 100 | 32 days supply | Qty: 9 | Fill #2

## 2020-03-13 MED FILL — METFORMIN HCL 500 MG TABS: 500 | 30 days supply | Qty: 120 | Fill #2

## 2020-03-26 ENCOUNTER — Telehealth: Payer: Self-pay

## 2020-03-26 ENCOUNTER — Ambulatory Visit (HOSPITAL_BASED_OUTPATIENT_CLINIC_OR_DEPARTMENT_OTHER): Payer: 59 | Admitting: Nurse Practitioner

## 2020-03-26 ENCOUNTER — Other Ambulatory Visit (HOSPITAL_COMMUNITY)
Admission: RE | Admit: 2020-03-26 | Discharge: 2020-03-26 | Disposition: A | Discharge status: Home / Self Care | Payer: 59 | Source: Ambulatory Visit | Attending: Nurse Practitioner | Admitting: Nurse Practitioner

## 2020-03-26 ENCOUNTER — Other Ambulatory Visit: Payer: Self-pay

## 2020-03-26 VITALS — BP 116/81 | HR 78 | Temp 97.7°F | Wt 242.0 lb

## 2020-03-26 DIAGNOSIS — L659 Nonscarring hair loss, unspecified: Secondary | ICD-10-CM

## 2020-03-26 DIAGNOSIS — N760 Acute vaginitis: Secondary | ICD-10-CM | POA: Diagnosis present

## 2020-03-26 DIAGNOSIS — E785 Hyperlipidemia, unspecified: Secondary | ICD-10-CM

## 2020-03-26 DIAGNOSIS — Z794 Long term (current) use of insulin: Secondary | ICD-10-CM | POA: Diagnosis not present

## 2020-03-26 DIAGNOSIS — D72819 Decreased white blood cell count, unspecified: Secondary | ICD-10-CM

## 2020-03-26 DIAGNOSIS — Z1159 Encounter for screening for other viral diseases: Secondary | ICD-10-CM

## 2020-03-26 DIAGNOSIS — E1165 Type 2 diabetes mellitus with hyperglycemia: Secondary | ICD-10-CM

## 2020-03-26 LAB — POCT GLYCOSYLATED HEMOGLOBIN (HGB A1C): Hemoglobin A1C: 7.6 % — AB (ref 4.0–5.6)

## 2020-03-26 LAB — GLUCOSE, POCT (MANUAL RESULT ENTRY): POC Glucose: 167 mg/dl — AB (ref 70–99)

## 2020-03-26 MED ORDER — TRUE METRIX BLOOD GLUCOSE TEST VI STRP: ORAL_STRIP | 12 refills | Status: DC

## 2020-03-26 MED ORDER — ONETOUCH VERIO VI STRP: ORAL_STRIP | 6 refills | Status: DC

## 2020-03-26 MED ORDER — ONETOUCH DELICA PLUS LANCET33G MISC: 6 refills | Status: DC

## 2020-03-26 MED ORDER — METFORMIN HCL 1000 MG PO TABS: 1000.0000 mg | ORAL_TABLET | Freq: Two times a day (BID) | ORAL | 2 refills | Status: DC

## 2020-03-26 MED ORDER — BD PEN NEEDLE MINI U/F 31G X 5 MM MISC: 6 refills | Status: DC

## 2020-03-26 MED ORDER — TRUEPLUS LANCETS 28G MISC: 6 refills | Status: DC

## 2020-03-26 MED ORDER — ONETOUCH VERIO REFLECT W/DEVICE KIT: PACK | 0 refills | Status: DC

## 2020-03-26 MED ORDER — VICTOZA 18 MG/3ML ~~LOC~~ SOPN: PEN_INJECTOR | SUBCUTANEOUS | 6 refills | Status: DC

## 2020-03-26 MED ORDER — INSULIN GLARGINE 100 UNIT/ML SOLOSTAR PEN: 28.0000 [IU] | PEN_INJECTOR | Freq: Every day | SUBCUTANEOUS | 2 refills | Status: DC

## 2020-03-26 MED FILL — ONETOUCH DELICA PLUS LANCET: 33 days supply | Qty: 100 | Fill #0

## 2020-03-26 MED FILL — VICTOZA 2-PAK 18 MG/3 ML PE: 18 | 27 days supply | Qty: 6 | Fill #0

## 2020-03-26 MED FILL — ONETOUCH VERIO REFLECT W/DE: W/DEVICE | 30 days supply | Qty: 1 | Fill #0

## 2020-03-26 MED FILL — BD PEN NDL MINI 31GX5MM: 31G X 5 MM | 90 days supply | Qty: 100 | Fill #0

## 2020-03-26 MED FILL — ONE TOUCH VERIO TEST STRIP: 30 days supply | Qty: 100 | Fill #0

## 2020-03-26 MED FILL — metFORMIN HCL 1000 MG TABS: 1000 | 90 days supply | Qty: 180 | Fill #0

## 2020-03-26 NOTE — Telephone Encounter (Signed)
PT HAS [facility] AND THEY PREFER THE ONE TOUCH VERIO PRODUCTS, IF APPROPRIATE, CAN YOU SEND IN SCRIPTS FOR THE METER,STRIPS AND LANCETS?

## 2020-03-26 NOTE — Addendum Note (Signed)
Addended by: Lois Huxley, Jeannett Senior L on: 03/26/2020 12:52 PM   Modules accepted: Orders

## 2020-03-26 NOTE — Telephone Encounter (Signed)
Rx sent 

## 2020-03-26 NOTE — Progress Notes (Signed)
Assessment & Plan:  Lisa [name] was seen today for follow-up.  Diagnoses and all orders for this visit:  Type 2 diabetes mellitus with hyperglycemia, with long-term current use of insulin ([facility]) -     Glucose (CBG) -     HgB A1c -     liraglutide (VICTOZA) 18 MG/3ML SOPN; SubQ:  Inject 0.6 mg once daily into the skin. Week 2: increase to 1.2 mg once daily; week 3: increase to 1.8 mg once daily -     Insulin Pen Needle (B-D UF III MINI PEN NEEDLES) 31G X 5 MM MISC; Use as instructed. Inject into the skin once nightly. -     insulin glargine (LANTUS) 100 UNIT/ML Solostar Pen; Inject 28 Units into the skin daily at 10 pm. -     Discontinue: glucose blood (TRUE METRIX BLOOD GLUCOSE TEST) test strip; Use as instructed -     Discontinue: TRUEplus Lancets 28G MISC; Use as instructed. Check blood glucose level by fingerstick twice per day. -     metFORMIN (GLUCOPHAGE) 1000 MG tablet; Take 1 tablet (1,000 mg total) by mouth 2 (two) times daily with a meal. -     CMP14+EGFR -     Lipid panel -     Microalbumin / creatinine urine ratio Continue blood sugar control as discussed in office today, low carbohydrate diet, and regular physical exercise as tolerated, 150 minutes per week (30 min each day, 5 days per week, or 50 min 3 days per week). Keep blood sugar logs with fasting goal of 90-130 mg/dl, post prandial (after you eat) less than 180.  For Hypoglycemia: BS <60 and Hyperglycemia BS >400; contact the clinic ASAP. Annual eye exams and foot exams are recommended.   Acute vaginitis -     Cervicovaginal ancillary only  Need for hepatitis C screening test -     Cancel: Hepatitis C Antibody -     Hepatitis C Antibody  Hair thinning -     Thyroid Panel With TSH -     CBC  Leukopenia, unspecified type -     CBC  Dyslipidemia, goal LDL below 70 INSTRUCTIONS: Work on a low fat, heart healthy diet and participate in regular aerobic exercise program by working out at least 150 minutes per week; 5  days a week-30 minutes per day. Avoid red meat/beef/steak,  fried foods. junk foods, sodas, sugary drinks, unhealthy snacking, alcohol and smoking.  Drink at least 80 oz of water per day and monitor your carbohydrate intake daily.    Patient has been counseled on age-appropriate routine health concerns for screening and prevention. These are reviewed and up-to-date. Referrals have been placed accordingly. Immunizations are up-to-date or declined.    Subjective:   Chief Complaint  Patient presents with  . Follow-up    Pt. is here for 3 months F.U on diabetes.    HPI Lisa [name] 39 y.o. female presents to office today for DM f/u VRI was used to communicate directly with patient for the entire encounter including providing detailed patient instructions.   has a past medical history of COVID-19, Diabetes mellitus ([place]), and Morbid obesity ([place]). Endorses fatigue and thinning hair. Getting about 5-6 hours of sleep at night. Drinks at least 7 bottles of water per day. Diagnosed with COVID in March.   DM TYPE 2 Improving. Taking lantus 28 units of lantus at night. Metformin 1000 mg BID. victoza 1.8 mg daily. Denies any symptoms of hypo or hyperglycemia. Overdue for eye exam.  BMI 40. She is not consistently diet or exercise adherent. LDL not at goal. She endorses adherence taking atorvastatin 40 mg daily.  Lab Results  Component Value Date   HGBA1C 7.6 (A) 03/26/2020   Lab Results  Component Value Date   HGBA1C 8.3 (A) 09/22/2019   Lab Results  Component Value Date   LDLCALC 98 03/26/2020   BP Readings from Last 3 Encounters:  03/26/20 116/81  02/28/20 127/86  12/22/19 (!) 138/96    Vaginitis Symptoms Vaginal discharge and irritation. Onset several days ago.   Review of Systems  Constitutional: Negative for fever, malaise/fatigue and weight loss.  HENT: Negative.  Negative for nosebleeds.   Eyes: Negative.  Negative for blurred vision, double vision and photophobia.    Respiratory: Negative.  Negative for cough and shortness of breath.   Cardiovascular: Negative.  Negative for chest pain, palpitations and leg swelling.  Gastrointestinal: Negative.  Negative for heartburn, nausea and vomiting.  Genitourinary:       SEE HPI  Musculoskeletal: Negative.  Negative for myalgias.  Neurological: Negative.  Negative for dizziness, focal weakness, seizures and headaches.  Psychiatric/Behavioral: Negative.  Negative for suicidal ideas.    Past Medical History:  Diagnosis Date  . COVID-19   . Diabetes mellitus ([place])   . Morbid obesity ([place])     No past surgical history on file.  Family History  Problem Relation Age of Onset  . Diabetes Maternal Grandmother   . Diabetes Maternal Grandfather   . Other Neg Hx     Social History Reviewed with no changes to be made today.   Outpatient Medications Prior to Visit  Medication Sig Dispense Refill  . acetaminophen (TYLENOL) 500 MG tablet Take 2 tablets (1,000 mg total) by mouth every 6 (six) hours as needed. 30 tablet 0  . atorvastatin (LIPITOR) 40 MG tablet Take 1 tablet (40 mg total) by mouth daily. 90 tablet 3  . cyclobenzaprine (FLEXERIL) 5 MG tablet Take 1 tablet (5 mg total) by mouth 3 (three) times daily as needed for muscle spasms. 30 tablet 1  . ibuprofen (ADVIL) 600 MG tablet Take 1 tablet (600 mg total) by mouth every 8 (eight) hours as needed. 60 tablet 1  . Blood Glucose Monitoring Suppl (TRUE METRIX METER) w/Device KIT Use as instructed. Check blood glucose level by fingerstick twice per day. 1 kit 0  . glucose blood (TRUE METRIX BLOOD GLUCOSE TEST) test strip Use as instructed 100 each 12  . insulin glargine (LANTUS) 100 UNIT/ML Solostar Pen Inject 28 Units into the skin daily at 10 pm. 9 mL 2  . Insulin Pen Needle (B-D UF III MINI PEN NEEDLES) 31G X 5 MM MISC Use as instructed. Inject into the skin once nightly. 100 each 6  . metFORMIN (GLUCOPHAGE) 500 MG tablet Take 2 tablets (1,000 mg total) by  mouth 2 (two) times daily with a meal. 120 tablet 2  . TRUEplus Lancets 28G MISC Use as instructed. Check blood glucose level by fingerstick twice per day. 100 each 6  . topiramate (TOPAMAX) 25 MG tablet Take 1 tablet (25 mg total) by mouth 2 (two) times daily. 60 tablet 2   No facility-administered medications prior to visit.    No Known Allergies     Objective:    BP 116/81 (BP Location: Left Arm, Patient Position: Sitting, Cuff Size: Normal)   Pulse 78   Temp 97.7 F (36.5 C) (Temporal)   Wt (!) 242 lb (109.8 kg)   SpO2 94%  BMI 40.27 kg/m  Wt Readings from Last 3 Encounters:  03/26/20 (!) 242 lb (109.8 kg)  02/28/20 200 lb (90.7 kg)  09/22/19 234 lb (106.1 kg)    Physical Exam Vitals and nursing note reviewed.  Constitutional:      Appearance: She is well-developed.  HENT:     Head: Normocephalic and atraumatic.  Cardiovascular:     Rate and Rhythm: Normal rate and regular rhythm.     Heart sounds: Normal heart sounds. No murmur heard.  No friction rub. No gallop.   Pulmonary:     Effort: Pulmonary effort is normal. No tachypnea or respiratory distress.     Breath sounds: Normal breath sounds. No decreased breath sounds, wheezing, rhonchi or rales.  Chest:     Chest wall: No tenderness.  Abdominal:     General: Bowel sounds are normal.     Palpations: Abdomen is soft.  Genitourinary:    Comments: SELF SWAB Musculoskeletal:        General: Normal range of motion.     Cervical back: Normal range of motion.  Skin:    General: Skin is warm and dry.  Neurological:     Mental Status: She is alert and oriented to person, place, and time.     Coordination: Coordination normal.  Psychiatric:        Behavior: Behavior normal. Behavior is cooperative.        Thought Content: Thought content normal.        Judgment: Judgment normal.          Patient has been counseled extensively about nutrition and exercise as well as the importance of adherence with  medications and regular follow-up. The patient was given clear instructions to go to ER or return to medical center if symptoms don't improve, worsen or new problems develop. The patient verbalized understanding.   Follow-up: Return for PAP SMEAR.   Gildardo Pounds, FNP-BC [facility] [place], [place]   03/30/2020, 12:26 PM

## 2020-03-27 LAB — LIPID PANEL
Chol/HDL Ratio: 3.8 ratio (ref 0.0–4.4)
Cholesterol, Total: 175 mg/dL (ref 100–199)
HDL: 46 mg/dL (ref >39)
LDL Chol Calc (NIH): 98 mg/dL (ref 0–99)
Triglycerides: 179 mg/dL — ABNORMAL HIGH (ref 0–149)
VLDL Cholesterol Cal: 31 mg/dL (ref 5–40)

## 2020-03-27 LAB — CERVICOVAGINAL ANCILLARY ONLY
Bacterial Vaginitis (gardnerella): NEGATIVE
Candida Glabrata: NEGATIVE
Candida Vaginitis: NEGATIVE
Chlamydia: NEGATIVE
Comment: NEGATIVE
Comment: NEGATIVE
Comment: NEGATIVE
Comment: NEGATIVE
Comment: NEGATIVE
Comment: NORMAL
Neisseria Gonorrhea: NEGATIVE
Trichomonas: NEGATIVE

## 2020-03-27 LAB — THYROID PANEL WITH TSH
Free Thyroxine Index: 1.9 (ref 1.2–4.9)
T3 Uptake Ratio: 26 % (ref 24–39)
T4, Total: 7.3 ug/dL (ref 4.5–12.0)
TSH: 5.03 u[IU]/mL — ABNORMAL HIGH (ref 0.450–4.500)

## 2020-03-27 LAB — CBC
Hematocrit: 38.8 % (ref 34.0–46.6)
Hemoglobin: 12.5 g/dL (ref 11.1–15.9)
MCH: 24.7 pg — ABNORMAL LOW (ref 26.6–33.0)
MCHC: 32.2 g/dL (ref 31.5–35.7)
MCV: 77 fL — ABNORMAL LOW (ref 79–97)
Platelets: 263 10*3/uL (ref 150–450)
RBC: 5.06 x10E6/uL (ref 3.77–5.28)
RDW: 14.5 % (ref 11.7–15.4)
WBC: 5.4 10*3/uL (ref 3.4–10.8)

## 2020-03-27 LAB — CMP14+EGFR
ALT: 24 IU/L (ref 0–32)
AST: 18 IU/L (ref 0–40)
Albumin/Globulin Ratio: 1.5 (ref 1.2–2.2)
Albumin: 4.4 g/dL (ref 3.8–4.8)
Alkaline Phosphatase: 82 IU/L (ref 48–121)
BUN/Creatinine Ratio: 17 (ref 9–23)
BUN: 11 mg/dL (ref 6–20)
Bilirubin Total: 0.3 mg/dL (ref 0.0–1.2)
CO2: 23 mmol/L (ref 20–29)
Calcium: 9.5 mg/dL (ref 8.7–10.2)
Chloride: 101 mmol/L (ref 96–106)
Creatinine, Ser: 0.66 mg/dL (ref 0.57–1.00)
GFR calc Af Amer: 130 mL/min/{1.73_m2} (ref >59)
GFR calc non Af Amer: 112 mL/min/{1.73_m2} (ref >59)
Globulin, Total: 3 g/dL (ref 1.5–4.5)
Glucose: 145 mg/dL — ABNORMAL HIGH (ref 65–99)
Potassium: 4.4 mmol/L (ref 3.5–5.2)
Sodium: 137 mmol/L (ref 134–144)
Total Protein: 7.4 g/dL (ref 6.0–8.5)

## 2020-03-27 LAB — MICROALBUMIN / CREATININE URINE RATIO
Creatinine, Urine: 250.9 mg/dL
Microalb/Creat Ratio: 4 mg/g creat (ref 0–29)
Microalbumin, Urine: 10.8 ug/mL

## 2020-03-27 LAB — HEPATITIS C ANTIBODY: Hep C Virus Ab: <0.1 s/co ratio (ref 0.0–0.9)

## 2020-03-30 ENCOUNTER — Encounter: Payer: Self-pay | Admitting: Nurse Practitioner

## 2020-04-26 MED FILL — VICTOZA 2-PAK 18 MG/3 ML PE: 18 | 27 days supply | Qty: 6 | Fill #1

## 2020-04-26 MED FILL — LANTUS SOLOSTAR 100 UNITS/M: 100 | 32 days supply | Qty: 9 | Fill #0

## 2020-05-03 ENCOUNTER — Ambulatory Visit: Payer: 59 | Attending: Nurse Practitioner | Admitting: Nurse Practitioner

## 2020-05-03 ENCOUNTER — Other Ambulatory Visit (HOSPITAL_COMMUNITY)
Admission: RE | Admit: 2020-05-03 | Discharge: 2020-05-03 | Disposition: A | Discharge status: Home / Self Care | Payer: 59 | Source: Ambulatory Visit | Attending: Nurse Practitioner | Admitting: Nurse Practitioner

## 2020-05-03 ENCOUNTER — Other Ambulatory Visit: Payer: Self-pay | Admitting: Nurse Practitioner

## 2020-05-03 ENCOUNTER — Other Ambulatory Visit: Payer: Self-pay

## 2020-05-03 ENCOUNTER — Encounter: Payer: Self-pay | Admitting: Nurse Practitioner

## 2020-05-03 VITALS — BP 118/83 | HR 100 | Temp 97.7°F | Wt 245.0 lb

## 2020-05-03 DIAGNOSIS — R7989 Other specified abnormal findings of blood chemistry: Secondary | ICD-10-CM

## 2020-05-03 DIAGNOSIS — Z114 Encounter for screening for human immunodeficiency virus [HIV]: Secondary | ICD-10-CM

## 2020-05-03 DIAGNOSIS — E1169 Type 2 diabetes mellitus with other specified complication: Secondary | ICD-10-CM | POA: Diagnosis not present

## 2020-05-03 DIAGNOSIS — Z794 Long term (current) use of insulin: Secondary | ICD-10-CM

## 2020-05-03 DIAGNOSIS — Z124 Encounter for screening for malignant neoplasm of cervix: Secondary | ICD-10-CM

## 2020-05-03 LAB — GLUCOSE, POCT (MANUAL RESULT ENTRY): POC Glucose: 216 mg/dl — AB (ref 70–99)

## 2020-05-03 MED ORDER — CANAGLIFLOZIN 300 MG PO TABS: 300.0000 mg | ORAL_TABLET | Freq: Every day | ORAL | 1 refills | Status: DC

## 2020-05-03 MED FILL — INVOKANA 300 MG TABLET: 300 | 30 days supply | Qty: 30 | Fill #0

## 2020-05-03 NOTE — Progress Notes (Signed)
Assessment & Plan:  Diagnoses and all orders for this visit:  Encounter for Papanicolaou smear for cervical cancer screening -     Cytology - PAP -     Cancel: Cervicovaginal ancillary only  Type 2 diabetes mellitus with other specified complication, with long-term current use of insulin (HCC) -     Glucose (CBG) -     canagliflozin (INVOKANA) 300 MG TABS tablet; Take 1 tablet (300 mg total) by mouth daily before breakfast. Continue blood sugar control as discussed in office today, low carbohydrate diet, and regular physical exercise as tolerated, 150 minutes per week (30 min each day, 5 days per week, or 50 min 3 days per week). Keep blood sugar logs with fasting goal of 90-130 mg/dl, post prandial (after you eat) less than 180.  For Hypoglycemia: BS <60 and Hyperglycemia BS >400; contact the clinic ASAP. Annual eye exams and foot exams are recommended.  Encounter for screening for HIV -     HIV antibody (with reflex)  Elevated TSH -     Thyroid Panel With TSH    Patient has been counseled on age-appropriate routine health concerns for screening and prevention. These are reviewed and up-to-date. Referrals have been placed accordingly. Immunizations are up-to-date or declined.    Subjective:  No chief complaint on file.  HPI Lisa [name] 39 y.o. female presents to office today for PAP smear She is concerned about significant weight gain despite her working out and eating healthier. Will trial off lantus and start her on invokana. She is to continue metformin and victoza. States most morning readings 130s  Review of Systems  Constitutional: Negative for chills, fever, malaise/fatigue and weight loss.       Weight gain  Respiratory: Negative.  Negative for cough, shortness of breath and wheezing.   Cardiovascular: Negative.  Negative for chest pain, orthopnea and leg swelling.  Gastrointestinal: Negative for abdominal pain.  Genitourinary: Negative.  Negative for flank  pain.  Skin: Negative.  Negative for rash.  Psychiatric/Behavioral: Negative for suicidal ideas.    Past Medical History:  Diagnosis Date   COVID-19    Diabetes mellitus ([place])    Morbid obesity ([place])     History reviewed. No pertinent surgical history.  Family History  Problem Relation Age of Onset   Diabetes Maternal Grandmother    Diabetes Maternal Grandfather    Other Neg Hx     Social History Reviewed with no changes to be made today.   Outpatient Medications Prior to Visit  Medication Sig Dispense Refill   acetaminophen (TYLENOL) 500 MG tablet Take 2 tablets (1,000 mg total) by mouth every 6 (six) hours as needed. 30 tablet 0   atorvastatin (LIPITOR) 40 MG tablet Take 1 tablet (40 mg total) by mouth daily. 90 tablet 3   Blood Glucose Monitoring Suppl (ONETOUCH VERIO REFLECT) w/Device KIT Use to check blood sugar TID. E11.69 1 kit 0   cyclobenzaprine (FLEXERIL) 5 MG tablet Take 1 tablet (5 mg total) by mouth 3 (three) times daily as needed for muscle spasms. 30 tablet 1   glucose blood (ONETOUCH VERIO) test strip Use to check blood sugar TID. E11.69 100 each 6   ibuprofen (ADVIL) 600 MG tablet Take 1 tablet (600 mg total) by mouth every 8 (eight) hours as needed. 60 tablet 1   Insulin Pen Needle (B-D UF III MINI PEN NEEDLES) 31G X 5 MM MISC Use as instructed. Inject into the skin once nightly. 100 each 6  Lancets (ONETOUCH DELICA PLUS IRJJOA41Y) MISC Use to check blood sugar TID. E11.69 100 each 6   liraglutide (VICTOZA) 18 MG/3ML SOPN SubQ:  Inject 0.6 mg once daily into the skin. Week 2: increase to 1.2 mg once daily; week 3: increase to 1.8 mg once daily 3 pen 6   metFORMIN (GLUCOPHAGE) 1000 MG tablet Take 1 tablet (1,000 mg total) by mouth 2 (two) times daily with a meal. 180 tablet 2   insulin glargine (LANTUS) 100 UNIT/ML Solostar Pen Inject 28 Units into the skin daily at 10 pm. 9 mL 2   topiramate (TOPAMAX) 25 MG tablet Take 1 tablet (25 mg total) by  mouth 2 (two) times daily. 60 tablet 2   No facility-administered medications prior to visit.    No Known Allergies     Objective:    BP 118/83 (BP Location: Left Arm, Patient Position: Sitting, Cuff Size: Large)    Pulse 100    Temp 97.7 F (36.5 C) (Temporal)    Wt 245 lb (111.1 kg)    SpO2 99%    BMI 40.77 kg/m  Wt Readings from Last 3 Encounters:  05/03/20 245 lb (111.1 kg)  03/26/20 (!) 242 lb (109.8 kg)  02/28/20 200 lb (90.7 kg)    Physical Exam Exam conducted with a chaperone present.  Constitutional:      Appearance: She is well-developed.  HENT:     Head: Normocephalic.  Cardiovascular:     Rate and Rhythm: Normal rate and regular rhythm.     Heart sounds: Normal heart sounds.  Pulmonary:     Effort: Pulmonary effort is normal.     Breath sounds: Normal breath sounds.  Abdominal:     General: Bowel sounds are normal.     Palpations: Abdomen is soft.     Hernia: There is no hernia in the left inguinal area.  Genitourinary:    Exam position: Lithotomy position.     Labia:        Right: No rash, tenderness, lesion or injury.        Left: No rash, tenderness, lesion or injury.      Vagina: Normal. No signs of injury and foreign body. No vaginal discharge, erythema, tenderness or bleeding.     Cervix: No cervical motion tenderness or friability.     Uterus: Not deviated and not enlarged.      Adnexa:        Right: No mass, tenderness or fullness.         Left: No mass, tenderness or fullness.       Rectum: Normal. No external hemorrhoid.  Lymphadenopathy:     Lower Body: No right inguinal adenopathy. No left inguinal adenopathy.  Skin:    General: Skin is warm and dry.  Neurological:     Mental Status: She is alert and oriented to person, place, and time.  Psychiatric:        Behavior: Behavior normal.        Thought Content: Thought content normal.        Judgment: Judgment normal.          Patient has been counseled extensively about nutrition and  exercise as well as the importance of adherence with medications and regular follow-up. The patient was given clear instructions to go to ER or return to medical center if symptoms don't improve, worsen or new problems develop. The patient verbalized understanding.   Follow-up: Return for meter check with luke 3 weeks. See me  in November.   Gildardo Pounds, FNP-BC [facility] and [place] [place], [place]   05/03/2020, 3:38 PM

## 2020-05-04 LAB — THYROID PANEL WITH TSH
Free Thyroxine Index: 1.6 (ref 1.2–4.9)
T3 Uptake Ratio: 23 % — ABNORMAL LOW (ref 24–39)
T4, Total: 6.8 ug/dL (ref 4.5–12.0)
TSH: 3.32 u[IU]/mL (ref 0.450–4.500)

## 2020-05-04 LAB — HIV ANTIBODY (ROUTINE TESTING W REFLEX): HIV Screen 4th Generation wRfx: NONREACTIVE

## 2020-05-10 LAB — CYTOLOGY - PAP
Comment: NEGATIVE
Diagnosis: NEGATIVE
High risk HPV: NEGATIVE

## 2020-05-24 ENCOUNTER — Other Ambulatory Visit: Payer: Self-pay

## 2020-05-24 ENCOUNTER — Encounter: Payer: Self-pay | Admitting: Pharmacist

## 2020-05-24 ENCOUNTER — Ambulatory Visit: Payer: No Typology Code available for payment source | Attending: Nurse Practitioner | Admitting: Pharmacist

## 2020-05-24 DIAGNOSIS — Z794 Long term (current) use of insulin: Secondary | ICD-10-CM | POA: Diagnosis not present

## 2020-05-24 DIAGNOSIS — E1165 Type 2 diabetes mellitus with hyperglycemia: Secondary | ICD-10-CM

## 2020-05-24 LAB — GLUCOSE, POCT (MANUAL RESULT ENTRY): POC Glucose: 138 mg/dl — AB (ref 70–99)

## 2020-05-24 MED ORDER — VICTOZA 18 MG/3ML ~~LOC~~ SOPN
1.8000 mg | PEN_INJECTOR | Freq: Every day | SUBCUTANEOUS | 2 refills | Status: DC
Start: 2020-05-24 — End: 2020-07-05

## 2020-05-24 MED FILL — VICTOZA 18 MG/3 ML INJECT P: 18 | 30 days supply | Qty: 9 | Fill #0

## 2020-05-24 NOTE — Progress Notes (Signed)
    S:    PCP: Zelda   No chief complaint on file.  Patient arrives in good spirits.  Presents for diabetes evaluation, education, and management. Patient was referred and last seen by Primary Care Provider on 05/03/2020. Pt reported frustration with weight gain at that visit. Zelda started Invokana and had her hold Lantus.  Family/Social History:  - FHx: diabetes  - Tobacco: never smoker  - Alcohol: denies use   Insurance coverage/medication affordability: Med Pay  Medication adherence reported.    Current diabetes medications include: canagliflozin 300 mg daily, liraglutide 1.8 mg daily, metformin 1000 mg BID Current hypertension medications include: none  Current hyperlipidemia medications include: atorvastatin 40 mg daily   Patient reports 1 hypoglycemic event since last visit. Treated successfully.   Patient reported dietary habits: Eats 2-3 meals/day - Reports improving diet since stopping Lantus - Limits sugars and carbs   Patient-reported exercise habits:  - Walking daily   Patient denies nocturia (nighttime urination).  Patient reports neuropathy (nerve pain). Patient denies visual changes. Patient reports self foot exams.     O:  POCT: 138  Lab Results  Component Value Date   HGBA1C 7.6 (A) 03/26/2020   There were no vitals filed for this visit.  Lipid Panel     Component Value Date/Time   CHOL 175 03/26/2020 1020   TRIG 179 (H) 03/26/2020 1020   HDL 46 03/26/2020 1020   CHOLHDL 3.8 03/26/2020 1020   LDLCALC 98 03/26/2020 1020    Home fasting blood sugars: not checking   2 hour post-meal/random blood sugars: 140s-150s reported. No meter with her today.   Clinical Atherosclerotic Cardiovascular Disease (ASCVD): No  The ASCVD Risk score Denman George DC Jr., et al., 2013) failed to calculate for the following reasons:   The 2013 ASCVD risk score is only valid for ages 48 to 87    A/P: Diabetes longstanding currently uncontrolled based on A1c but reported  home sugars are at goal. Patient is able to verbalize appropriate hypoglycemia management plan. Medication adherence appears appropriate. -Continue current regimen. -Extensively discussed pathophysiology of diabetes, recommended lifestyle interventions, dietary effects on blood sugar control -Counseled on s/sx of and management of hypoglycemia -Next A1C anticipated 05/2020.   ASCVD risk - primary prevention in patient with diabetes. Last LDL is controlled. ASCVD risk score is not >20%  - At least moderate intensity statin indicated.  -Continued atorvastatin 40 mg.   HM: flu shot and PNA shot due.  - Pt defers these today  Written patient instructions provided. Total time in face to face counseling 30 minutes.   Follow up PCP Clinic Visit in November.   Butch Penny, PharmD, CPP Clinical Pharmacist [facility] & [facility] [phone]

## 2020-06-10 MED FILL — INVOKANA 300 MG TABLET: 300 | 30 days supply | Qty: 30 | Fill #1

## 2020-07-05 ENCOUNTER — Other Ambulatory Visit: Payer: Self-pay

## 2020-07-05 ENCOUNTER — Other Ambulatory Visit: Payer: Self-pay | Admitting: Nurse Practitioner

## 2020-07-05 ENCOUNTER — Ambulatory Visit: Payer: 59 | Attending: Nurse Practitioner | Admitting: Nurse Practitioner

## 2020-07-05 ENCOUNTER — Encounter: Payer: Self-pay | Admitting: Nurse Practitioner

## 2020-07-05 DIAGNOSIS — Z794 Long term (current) use of insulin: Secondary | ICD-10-CM

## 2020-07-05 DIAGNOSIS — E785 Hyperlipidemia, unspecified: Secondary | ICD-10-CM

## 2020-07-05 DIAGNOSIS — E1165 Type 2 diabetes mellitus with hyperglycemia: Secondary | ICD-10-CM

## 2020-07-05 MED ORDER — VICTOZA 18 MG/3ML ~~LOC~~ SOPN: 1.8000 mg | PEN_INJECTOR | Freq: Every day | SUBCUTANEOUS | 2 refills | Status: DC

## 2020-07-05 MED ORDER — METFORMIN HCL 1000 MG PO TABS: 1000.0000 mg | ORAL_TABLET | Freq: Two times a day (BID) | ORAL | 2 refills | Status: DC

## 2020-07-05 MED ORDER — CANAGLIFLOZIN 300 MG PO TABS: 300.0000 mg | ORAL_TABLET | Freq: Every day | ORAL | 1 refills | Status: DC

## 2020-07-05 MED FILL — METFORMIN HCL 1000 MG TABS: 1000 | 90 days supply | Qty: 180 | Fill #0

## 2020-07-05 MED FILL — INVOKANA 300 MG TABLET: 300 | 90 days supply | Qty: 90 | Fill #0

## 2020-07-05 MED FILL — VICTOZA 18 MG/3 ML INJECT P: 18 | 30 days supply | Qty: 9 | Fill #0

## 2020-07-05 NOTE — Progress Notes (Signed)
Virtual Visit via Telephone Note Due to national recommendations of social distancing due to COVID 19, telehealth visit is felt to be most appropriate for this patient at this time.  I discussed the limitations, risks, security and privacy concerns of performing an evaluation and management service by telephone and the availability of in person appointments. I also discussed with the patient that there may be a patient responsible charge related to this service. The patient expressed understanding and agreed to proceed.    I connected with Lisa [name] on 07/05/20  at   9:50 AM EDT  EDT by telephone and verified that I am speaking with the correct person using two identifiers.   Consent I discussed the limitations, risks, security and privacy concerns of performing an evaluation and management service by telephone and the availability of in person appointments. I also discussed with the patient that there may be a patient responsible charge related to this service. The patient expressed understanding and agreed to proceed.   Location of Patient: Private  Residence   Location of Provider: [facility] [place] [facility]    Persons participating in Telemedicine visit: Bertram Denver FNP-BC [place]Y [place] CMA Olga Mohamed Veracruz  Arabic Interpreter ID # [id number]   History of Present Illness: Telemedicine visit for: Follow up  DM TYPE 2 Most recent reading was 110. Average readings 170-180. Has lost 10lbs. Eating more vegetables and less carbs and more grilled chicken. Current medications include metformin 1000 mg BID, victoza 1.8 mg daily and invokana 300 mg daily. Cholesterol levels are not at goal. Taking atorvastatin 40 mg daily as prescribed. She denies any hypo or hyperglycemic symptoms.  Lab Results  Component Value Date   HGBA1C 7.6 (A) 03/26/2020   Lab Results  Component Value Date   LDLCALC 98 03/26/2020     Past Medical History:  Diagnosis Date  . COVID-19    . Diabetes mellitus (HCC)   . Morbid obesity (HCC)     History reviewed. No pertinent surgical history.  Family History  Problem Relation Age of Onset  . Diabetes Maternal Grandmother   . Diabetes Maternal Grandfather   . Other Neg Hx     Social History   Socioeconomic History  . Marital status: Married    Spouse name: Not on file  . Number of children: Not on file  . Years of education: Not on file  . Highest education level: Not on file  Occupational History  . Not on file  Tobacco Use  . Smoking status: Never Smoker  . Smokeless tobacco: Never Used  Vaping Use  . Vaping Use: Never used  Substance and Sexual Activity  . Alcohol use: No  . 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 Sexual activity: Not Currently  Other Topics Concern  . Not on file  Social History Narrative  . Not on file   Social Determinants of Health   Financial Resource Strain:   . Difficulty of Paying Living Expenses: Not on file  Food Insecurity:   . Worried About Programme researcher, broadcasting/film/video in the Last Year: Not on file  . Ran Out of Food in the Last Year: Not on file  Transportation Needs:   . Lack of Transportation (Medical): Not on file  . Lack of Transportation (Non-Medical): Not on file  Physical Activity:   . Days of Exercise per Week: Not on file  . Minutes of Exercise per Session: Not on file  Stress:   .  Feeling of Stress : Not on file  Social Connections:   . Frequency of Communication with Friends and Family: Not on file  . Frequency of Social Gatherings with Friends and Family: Not on file  . Attends Religious Services: Not on file  . Active Member of Clubs or Organizations: Not on file  . Attends Banker Meetings: Not on file  . Marital Status: Not on file     Observations/Objective: Awake, alert and oriented x 3   Review of Systems  Constitutional: Negative for fever, malaise/fatigue and weight loss.  HENT: Negative.  Negative for nosebleeds.    Eyes: Negative.  Negative for blurred vision, double vision and photophobia.  Respiratory: Negative.  Negative for cough and shortness of breath.   Cardiovascular: Negative.  Negative for chest pain, palpitations and leg swelling.  Gastrointestinal: Negative.  Negative for heartburn, nausea and vomiting.  Musculoskeletal: Negative.  Negative for myalgias.  Neurological: Negative.  Negative for dizziness, focal weakness, seizures and headaches.  Psychiatric/Behavioral: Negative.  Negative for suicidal ideas.    Assessment and Plan: Lisa [name] was seen today for medication refill.  Diagnoses and all orders for this visit:  Type 2 diabetes mellitus with hyperglycemia, with long-term current use of insulin (HCC) -     metFORMIN (GLUCOPHAGE) 1000 MG tablet; Take 1 tablet (1,000 mg total) by mouth 2 (two) times daily with a meal. -     liraglutide (VICTOZA) 18 MG/3ML SOPN; Inject 1.8 mg into the skin daily. -     canagliflozin (INVOKANA) 300 MG TABS tablet; Take 1 tablet (300 mg total) by mouth daily before breakfast.  Dyslipidemia, goal LDL below 70 INSTRUCTIONS: Work on a low fat, heart healthy diet and participate in regular aerobic exercise program by working out at least 150 minutes per week; 5 days a week-30 minutes per day. Avoid red meat/beef/steak,  fried foods. junk foods, sodas, sugary drinks, unhealthy snacking, alcohol and smoking.  Drink at least 80 oz of water per day and monitor your carbohydrate intake daily.     Follow Up Instructions Return in about 3 months (around 10/05/2020).     I discussed the assessment and treatment plan with the patient. The patient was provided an opportunity to ask questions and all were answered. The patient agreed with the plan and demonstrated an understanding of the instructions.   The patient was advised to call back or seek an in-person evaluation if the symptoms worsen or if the condition fails to improve as anticipated.  I provided 16 minutes  of non-face-to-face time during this encounter including median intraservice time, reviewing previous notes, labs, imaging, medications and explaining diagnosis and management.  Claiborne Rigg, FNP-BC

## 2020-07-19 ENCOUNTER — Encounter: Payer: Self-pay | Admitting: Pharmacist

## 2020-07-19 ENCOUNTER — Other Ambulatory Visit: Payer: Self-pay | Admitting: Nurse Practitioner

## 2020-07-19 ENCOUNTER — Ambulatory Visit: Payer: No Typology Code available for payment source | Attending: Nurse Practitioner | Admitting: Pharmacist

## 2020-07-19 ENCOUNTER — Other Ambulatory Visit: Payer: Self-pay

## 2020-07-19 DIAGNOSIS — E118 Type 2 diabetes mellitus with unspecified complications: Secondary | ICD-10-CM

## 2020-07-19 DIAGNOSIS — E1165 Type 2 diabetes mellitus with hyperglycemia: Secondary | ICD-10-CM | POA: Diagnosis not present

## 2020-07-19 DIAGNOSIS — E119 Type 2 diabetes mellitus without complications: Secondary | ICD-10-CM

## 2020-07-19 DIAGNOSIS — IMO0002 Reserved for concepts with insufficient information to code with codable children: Secondary | ICD-10-CM

## 2020-07-19 LAB — GLUCOSE, POCT (MANUAL RESULT ENTRY): POC Glucose: 125 mg/dl — AB (ref 70–99)

## 2020-07-19 NOTE — Progress Notes (Signed)
    S:    PCP: Zelda   No chief complaint on file.  Patient arrives in good spirits.  Presents for diabetes evaluation, education, and management. Patient was referred and last seen by Primary Care Provider on 07/05/2020. No med changes were made at that visit.   Family/Social History:  - FHx: diabetes  - Tobacco: never smoker  - Alcohol: denies use   Insurance coverage/medication affordability: Med Pay  Medication adherence reported.    Current diabetes medications include: canagliflozin 300 mg daily, liraglutide 1.8 mg daily, metformin 1000 mg BID Current hypertension medications include: none  Current hyperlipidemia medications include: atorvastatin 40 mg daily   Patient denies hypoglycemia.  Patient reported dietary habits: Eats 2-3 meals/day - Reports improving diet since stopping Lantus - Limits sugars and carbs   Patient-reported exercise habits:  - Walking daily   Patient denies nocturia (nighttime urination).  Patient reports neuropathy (nerve pain). Patient denies visual changes. Patient reports self foot exams.     O:  POCT: 125  Lab Results  Component Value Date   HGBA1C 7.6 (A) 03/26/2020   There were no vitals filed for this visit.  Lipid Panel     Component Value Date/Time   CHOL 175 03/26/2020 1020   TRIG 179 (H) 03/26/2020 1020   HDL 46 03/26/2020 1020   CHOLHDL 3.8 03/26/2020 1020   LDLCALC 98 03/26/2020 1020    Home fasting blood sugars: not checking   2 hour post-meal/random blood sugars: not checking   Clinical Atherosclerotic Cardiovascular Disease (ASCVD): No  The ASCVD Risk score Denman George DC Jr., et al., 2013) failed to calculate for the following reasons:   The 2013 ASCVD risk score is only valid for ages 9 to 52    A/P: Diabetes longstanding currently uncontrolled based on A1c but reported home sugars are at goal. Patient is able to verbalize appropriate hypoglycemia management plan. Medication adherence appears  appropriate. -Continue current regimen. -Extensively discussed pathophysiology of diabetes, recommended lifestyle interventions, dietary effects on blood sugar control -Counseled on s/sx of and management of hypoglycemia -Labs: BMP, A1c per PCP.   ASCVD risk - primary prevention in patient with diabetes. Last LDL is 98. ASCVD risk score cannot be calc d/t age. - At least moderate intensity statin indicated.  -Continued atorvastatin 40 mg.   Written patient instructions provided. Total time in face to face counseling 30 minutes.   Follow up PCP Clinic Visit in Feb.   Butch Penny, PharmD, CPP Clinical Pharmacist [facility] & [facility] [phone]

## 2020-07-20 LAB — HEMOGLOBIN A1C
Est. average glucose Bld gHb Est-mCnc: 154 mg/dL
Hgb A1c MFr Bld: 7 % — ABNORMAL HIGH (ref 4.8–5.6)

## 2020-07-20 LAB — BASIC METABOLIC PANEL
BUN/Creatinine Ratio: 20 (ref 9–23)
BUN: 10 mg/dL (ref 6–20)
CO2: 21 mmol/L (ref 20–29)
Calcium: 9.5 mg/dL (ref 8.7–10.2)
Chloride: 102 mmol/L (ref 96–106)
Creatinine, Ser: 0.51 mg/dL — ABNORMAL LOW (ref 0.57–1.00)
GFR calc Af Amer: 141 mL/min/{1.73_m2} (ref >59)
GFR calc non Af Amer: 122 mL/min/{1.73_m2} (ref >59)
Glucose: 100 mg/dL — ABNORMAL HIGH (ref 65–99)
Potassium: 4 mmol/L (ref 3.5–5.2)
Sodium: 140 mmol/L (ref 134–144)

## 2020-10-18 ENCOUNTER — Ambulatory Visit: Payer: 59 | Admitting: Nurse Practitioner

## 2020-11-07 ENCOUNTER — Other Ambulatory Visit: Payer: Self-pay | Admitting: Nurse Practitioner

## 2020-11-07 DIAGNOSIS — E1165 Type 2 diabetes mellitus with hyperglycemia: Secondary | ICD-10-CM

## 2020-11-07 MED ORDER — CANAGLIFLOZIN 300 MG PO TABS: 300.0000 mg | ORAL_TABLET | Freq: Every day | ORAL | 0 refills | Status: DC

## 2020-11-07 NOTE — Telephone Encounter (Signed)
Medication: canagliflozin (INVOKANA) 300 MG TABS tablet [[id number]]   Has the patient contacted their pharmacy? YES (Agent: If no, request that the patient contact the pharmacy for the refill.) (Agent: If yes, when and what did the pharmacy advise?)  Preferred Pharmacy (with phone number or street name): [facility] [address] [place], [place]. [address] [phone]  Agent: Please be advised that RX refills may take up to 3 business days. We ask that you follow-up with your pharmacy.

## 2020-11-08 ENCOUNTER — Other Ambulatory Visit: Payer: Self-pay | Admitting: Nurse Practitioner

## 2020-11-08 DIAGNOSIS — E1165 Type 2 diabetes mellitus with hyperglycemia: Secondary | ICD-10-CM

## 2020-11-08 DIAGNOSIS — Z794 Long term (current) use of insulin: Secondary | ICD-10-CM

## 2020-11-08 NOTE — Telephone Encounter (Signed)
Pt stated the medication is too expensive and cant afford this medication.   Pt asked if a different option could be sent in or a generic which is more affordable. Pt stated she has been out of meds for a few days now. Please advise.

## 2020-11-08 NOTE — Telephone Encounter (Signed)
Requested medication (s) are due for refill today: na  Requested medication (s) are on the active medication list: yes  Last refill:  11/07/20 #90 0 refills  Future visit scheduled: no  Notes to clinic:  patient reports medication too expensive and can't afford. Requesting a different option and she has been out of medication for days. Please advise      Requested Prescriptions  Pending Prescriptions Disp Refills   INVOKANA 300 MG TABS tablet [Pharmacy Med Name: INVOKANA 300 MG TABLET] 90 tablet 0    Sig: TAKE 1 TABLET BY MOUTH DAILY BEFORE BREAKFAST. OFFICE VISIT NEEDED FOR ADDITIONAL REFILLS      Endocrinology: Diabetes - SGLT2 Inhibitors - canagliflozin Failed - 11/08/2020  5:31 PM      Failed - Cr in normal range and within 360 days    Creat  Date Value Ref Range Status  09/05/2012 0.52 0.50 - 1.10 mg/dL Final   Creatinine, Ser  Date Value Ref Range Status  07/19/2020 0.51 (L) 0.57 - 1.00 mg/dL Final   Creatinine, Urine  Date Value Ref Range Status  09/07/2012 62.2 mg/dL Final          Failed - LDL in normal range and within 360 days    LDL Chol Calc (NIH)  Date Value Ref Range Status  03/26/2020 98 0 - 99 mg/dL Final          Passed - HBA1C is between 0 and 7.9 and within 180 days    Hgb A1c MFr Bld  Date Value Ref Range Status  07/19/2020 7.0 (H) 4.8 - 5.6 % Final    Comment:             Prediabetes: 5.7 - 6.4          Diabetes: >6.4          Glycemic control for adults with diabetes: <7.0           Passed - eGFR in normal range and within 360 days    GFR calc Af Amer  Date Value Ref Range Status  07/19/2020 141 >59 mL/min/1.73 Final    Comment:    **In accordance with recommendations from the NKF-ASN Task force,**   [facility] is in the process of updating its eGFR calculation to the   2021 CKD-EPI creatinine equation that estimates kidney function   without a race variable.    GFR calc non Af Amer  Date Value Ref Range Status  07/19/2020 122 >59  mL/min/1.73 Final          Passed - Valid encounter within last 6 months    Recent Outpatient Visits           3 months ago Diabetes mellitus type 2, uncontrolled, with complications [facility])   [place], Annie [place] L, RPH-CPP   4 months ago Type 2 diabetes mellitus with hyperglycemia, with long-term current use of insulin [facility])   [place], [place] W, NP   5 months ago Type 2 diabetes mellitus with hyperglycemia, with long-term current use of insulin [facility])   [place], Jarome [place], RPH-CPP   6 months ago Encounter for Papanicolaou smear for cervical cancer screening   [place], [place] W, NP   7 months ago Type 2 diabetes mellitus with hyperglycemia, with long-term current use of insulin [facility])   [place], [place]  W, NP

## 2020-11-08 NOTE — Telephone Encounter (Signed)
The medication the pt could not afford was canagliflozin (INVOKANA) 300 MG TABS tablet  Please advise

## 2020-11-11 ENCOUNTER — Telehealth: Payer: Self-pay

## 2020-11-11 ENCOUNTER — Telehealth: Payer: Self-pay | Admitting: Nurse Practitioner

## 2020-11-11 ENCOUNTER — Other Ambulatory Visit: Payer: Self-pay | Admitting: Nurse Practitioner

## 2020-11-11 DIAGNOSIS — E1165 Type 2 diabetes mellitus with hyperglycemia: Secondary | ICD-10-CM

## 2020-11-11 MED ORDER — CANAGLIFLOZIN 300 MG PO TABS: ORAL_TABLET | ORAL | 0 refills | Status: DC

## 2020-11-11 MED ORDER — EMPAGLIFLOZIN 10 MG PO TABS: 10.0000 mg | ORAL_TABLET | Freq: Every day | ORAL | 3 refills | Status: DC

## 2020-11-11 NOTE — Telephone Encounter (Signed)
Yes mam. I will send. thanks

## 2020-11-11 NOTE — Telephone Encounter (Signed)
Pt states [facility] told her empagliflozin (JARDIANCE) 10 MG TABS tablet [[id number]] was not covered by her [facility]. Is there an alternate option to assist the patient further? Please advise and thank you.

## 2020-11-11 NOTE — Telephone Encounter (Signed)
Contacted pt to go over Lisa [name] response pt states she understands and is on the way

## 2020-11-11 NOTE — Telephone Encounter (Signed)
Ins prefers Jardiance. If appropriate, can we change Invokana therapy?

## 2020-11-11 NOTE — Telephone Encounter (Signed)
Per chart medication has been changed.

## 2020-11-11 NOTE — Telephone Encounter (Signed)
She needs [facility] from the [facility] and fill out the pass program paperwork

## 2020-11-11 NOTE — Telephone Encounter (Signed)
Will forward to provider for other options

## 2020-11-11 NOTE — Telephone Encounter (Signed)
Copied from CRM [id number]. Topic: General - Other >> Nov 08, 2020  5:33 PM Pawlus, Maxine Glenn A wrote: Reason for CRM: Pt stated she cannot afford canagliflozin (INVOKANA) 300 MG TABS tablet, please advise of generics or other options.

## 2020-11-11 NOTE — Telephone Encounter (Signed)
Pt called stating that the medication Invokana that was sent to the pharmacy cost too much for her. She is requesting to have it sent to a different pharmacy instead. Please advise.      [facility] [place], [place] - [place] [address]  [address] [place] [address]  Phone: [phone] Fax: [phone]  Hours: Not open 24 hours

## 2020-11-12 ENCOUNTER — Telehealth: Payer: Self-pay | Admitting: Nurse Practitioner

## 2020-11-12 NOTE — Telephone Encounter (Signed)
I have never seen this patient before

## 2020-11-12 NOTE — Telephone Encounter (Signed)
Pt is requesting change in listed medications.

## 2020-11-12 NOTE — Telephone Encounter (Signed)
Pt is requesting different medications for Jardiance and invokana.

## 2020-11-12 NOTE — Telephone Encounter (Signed)
Pt called back for follow up on same request  Best contact: [phone]

## 2020-11-12 NOTE — Telephone Encounter (Signed)
Copied from CRM [id number]. Topic: General - Other >> Nov 11, 2020  4:23 PM Mcneil, Ja-Kwan wrote: Reason for CRM: Pt stated the Rx for canagliflozin (INVOKANA) 300 MG TABS tablet is too expensive and she would like to request that a Rx for another medication be prescribed.

## 2020-11-16 NOTE — Telephone Encounter (Signed)
Needs office visit.

## 2020-11-18 NOTE — Telephone Encounter (Signed)
Please schedule patient for a in person appointment.

## 2020-11-19 ENCOUNTER — Ambulatory Visit: Payer: Self-pay

## 2020-11-19 NOTE — Telephone Encounter (Signed)
Will forward to provider  

## 2020-11-19 NOTE — Telephone Encounter (Signed)
Pt. Reports she started taking Jardiance Friday,11/15/20. States she has had dizziness and headache ever since. Did not take it this morning and "feels a little better." Please advise.  Answer Assessment - Initial Assessment Questions 1. DESCRIPTION: "Describe your dizziness."     Dizzy 2. LIGHTHEADED: "Do you feel lightheaded?" (e.g., somewhat faint, woozy, weak upon standing)     Yes 3. VERTIGO: "Do you feel like either you or the room is spinning or tilting?" (i.e. vertigo)     No 4. SEVERITY: "How bad is it?"  "Do you feel like you are going to faint?" "Can you stand and walk?"   - MILD: Feels slightly dizzy, but walking normally.   - MODERATE: Feels very unsteady when walking, but not falling; interferes with normal activities (e.g., school, work) .   - SEVERE: Unable to walk without falling, or requires assistance to walk without falling; feels like passing out now.      Mild 5. ONSET:  "When did the dizziness begin?"     Friday 6. AGGRAVATING FACTORS: "Does anything make it worse?" (e.g., standing, change in head position)     No 7. HEART RATE: "Can you tell me your heart rate?" "How many beats in 15 seconds?"  (Note: not all patients can do this)       No 8. CAUSE: "What do you think is causing the dizziness?"     Jardiance 9. RECURRENT SYMPTOM: "Have you had dizziness before?" If Yes, ask: "When was the last time?" "What happened that time?"     No 10. OTHER SYMPTOMS: "Do you have any other symptoms?" (e.g., fever, chest pain, vomiting, diarrhea, bleeding)       Headache 11. PREGNANCY: "Is there any chance you are pregnant?" "When was your last menstrual period?"       No  Protocols used: DIZZINESS [facility]

## 2020-11-20 ENCOUNTER — Other Ambulatory Visit: Payer: Self-pay | Admitting: Nurse Practitioner

## 2020-11-20 MED ORDER — GLIPIZIDE 5 MG PO TABS: 2.5000 mg | ORAL_TABLET | Freq: Two times a day (BID) | ORAL | 3 refills | Status: DC

## 2020-11-20 MED FILL — glipiZIDE 5 MG TABS: 5 | 30 days supply | Qty: 30 | Fill #0

## 2020-11-20 NOTE — Telephone Encounter (Signed)
Glipizide has been sent.

## 2020-11-20 NOTE — Telephone Encounter (Signed)
Contacted pt and made aware rx has been sent pt doesn't have any questions or concerns

## 2020-11-30 ENCOUNTER — Other Ambulatory Visit: Payer: Self-pay

## 2020-12-10 NOTE — Telephone Encounter (Signed)
Patient scheduled with PCP.

## 2020-12-11 ENCOUNTER — Ambulatory Visit: Payer: 59 | Attending: Nurse Practitioner | Admitting: Nurse Practitioner

## 2020-12-11 ENCOUNTER — Other Ambulatory Visit: Payer: Self-pay

## 2020-12-11 ENCOUNTER — Encounter: Payer: Self-pay | Admitting: Nurse Practitioner

## 2020-12-11 VITALS — BP 121/85 | HR 76 | Resp 18 | Ht 66.0 in | Wt 224.2 lb

## 2020-12-11 DIAGNOSIS — E1165 Type 2 diabetes mellitus with hyperglycemia: Secondary | ICD-10-CM | POA: Diagnosis not present

## 2020-12-11 DIAGNOSIS — E785 Hyperlipidemia, unspecified: Secondary | ICD-10-CM

## 2020-12-11 DIAGNOSIS — G44229 Chronic tension-type headache, not intractable: Secondary | ICD-10-CM

## 2020-12-11 DIAGNOSIS — Z794 Long term (current) use of insulin: Secondary | ICD-10-CM | POA: Diagnosis not present

## 2020-12-11 DIAGNOSIS — R7989 Other specified abnormal findings of blood chemistry: Secondary | ICD-10-CM

## 2020-12-11 DIAGNOSIS — M79604 Pain in right leg: Secondary | ICD-10-CM

## 2020-12-11 DIAGNOSIS — D72819 Decreased white blood cell count, unspecified: Secondary | ICD-10-CM

## 2020-12-11 DIAGNOSIS — M79605 Pain in left leg: Secondary | ICD-10-CM

## 2020-12-11 LAB — POCT GLYCOSYLATED HEMOGLOBIN (HGB A1C): HbA1c, POC (controlled diabetic range): 6.9 % (ref 0.0–7.0)

## 2020-12-11 LAB — GLUCOSE, POCT (MANUAL RESULT ENTRY): POC Glucose: 163 mg/dl — AB (ref 70–99)

## 2020-12-11 MED ORDER — ATORVASTATIN CALCIUM 40 MG PO TABS
40.0000 mg | ORAL_TABLET | Freq: Every day | ORAL | 3 refills | Status: DC
  Filled 2020-12-11: qty 90, 90d supply, fill #0

## 2020-12-11 MED ORDER — ONETOUCH DELICA PLUS LANCET33G MISC
6 refills | Status: DC
  Filled 2020-12-11: qty 100, 33d supply, fill #0

## 2020-12-11 MED ORDER — BD PEN NEEDLE MINI U/F 31G X 5 MM MISC
6 refills | Status: AC
  Filled 2020-12-11: qty 100, 90d supply, fill #0

## 2020-12-11 MED ORDER — AMITRIPTYLINE HCL 10 MG PO TABS
10.0000 mg | ORAL_TABLET | Freq: Every day | ORAL | 0 refills | Status: DC
  Filled 2020-12-11: qty 30, 30d supply, fill #0

## 2020-12-11 MED ORDER — CYCLOBENZAPRINE HCL 5 MG PO TABS
5.0000 mg | ORAL_TABLET | Freq: Three times a day (TID) | ORAL | 1 refills | Status: DC | PRN
  Filled 2020-12-11: qty 30, 10d supply, fill #0

## 2020-12-11 MED ORDER — GLIPIZIDE 5 MG PO TABS
ORAL_TABLET | ORAL | 3 refills | Status: DC
  Filled 2020-12-11: qty 60, fill #0

## 2020-12-11 MED ORDER — METFORMIN HCL 1000 MG PO TABS
ORAL_TABLET | Freq: Two times a day (BID) | ORAL | 2 refills | Status: DC
  Filled 2020-12-11: qty 180, 90d supply, fill #0

## 2020-12-11 MED ORDER — LIRAGLUTIDE 18 MG/3ML ~~LOC~~ SOPN
1.8000 mg | PEN_INJECTOR | Freq: Every day | SUBCUTANEOUS | 2 refills | Status: DC
  Filled 2020-12-11: qty 9, 30d supply, fill #0

## 2020-12-11 MED ORDER — ONETOUCH VERIO VI STRP
ORAL_STRIP | 6 refills | Status: AC
  Filled 2020-12-11: qty 100, 33d supply, fill #0

## 2020-12-11 NOTE — Progress Notes (Signed)
Assessment & Plan:  [name] was seen today for diabetes.  Diagnoses and all orders for this visit:  Type 2 diabetes mellitus with hyperglycemia, with long-term current use of insulin (HCC) -     POCT glucose (manual entry) -     POCT glycosylated hemoglobin (Hb A1C) -     metFORMIN (GLUCOPHAGE) 1000 MG tablet; TAKE 1 TABLET (1,000 MG TOTAL) BY MOUTH 2 (TWO) TIMES DAILY WITH A MEAL. -     liraglutide (VICTOZA) 18 MG/3ML SOPN; INJECT 1.8 MG INTO THE SKIN DAILY. -     Lancets (ONETOUCH DELICA PLUS [id number]) MISC; Use to check blood sugar 3 times daily. E11.69 -     Insulin Pen Needle (B-D UF III MINI PEN NEEDLES) 31G X 5 MM MISC; Use as instructed. Inject into the skin once nightly. -     glucose blood (ONETOUCH VERIO) test strip; Use to check blood sugar 3 times daily. E11.69 -     glipiZIDE (GLUCOTROL) 5 MG tablet; TAKE 0.5 TABLETS (2.5 MG TOTAL) BY MOUTH 2 (TWO) TIMES DAILY BEFORE A MEAL. -     CMP14+EGFR Continue blood sugar control as discussed in office today, low carbohydrate diet, and regular physical exercise as tolerated, 150 minutes per week (30 min each day, 5 days per week, or 50 min 3 days per week). Keep blood sugar logs with fasting goal of 90-130 mg/dl, post prandial (after you eat) less than 180.  For Hypoglycemia: BS <60 and Hyperglycemia BS >400; contact the clinic ASAP. Annual eye exams and foot exams are recommended.   Dyslipidemia, goal LDL below 70 -     atorvastatin (LIPITOR) 40 MG tablet; Take 1 tablet (40 mg total) by mouth daily. -     Lipid panel INSTRUCTIONS: Work on a low fat, heart healthy diet and participate in regular aerobic exercise program by working out at least 150 minutes per week; 5 days a week-30 minutes per day. Avoid red meat/beef/steak,  fried foods. junk foods, sodas, sugary drinks, unhealthy snacking, alcohol and smoking.  Drink at least 80 oz of water per day and monitor your carbohydrate intake daily.    Chronic tension-type headache, not  intractable -     amitriptyline (ELAVIL) 10 MG tablet; Take 1 tablet (10 mg total) by mouth at bedtime. -     cyclobenzaprine (FLEXERIL) 5 MG tablet; Take 1 tablet (5 mg total) by mouth 3 (three) times daily as needed for muscle spasms.  Pain in both lower extremities -     amitriptyline (ELAVIL) 10 MG tablet; Take 1 tablet (10 mg total) by mouth at bedtime.  Elevated TSH -     Thyroid Panel With TSH  Leukopenia, unspecified type -     CBC    Patient has been counseled on age-appropriate routine health concerns for screening and prevention. These are reviewed and up-to-date. Referrals have been placed accordingly. Immunizations are up-to-date or declined.    Subjective:   Chief Complaint  Patient presents with  . Diabetes   HPI Lisa [name] 40 y.o. female presents to office today for follow up. She has a past medical history of COVID-19, DM2, HPL, and Morbid obesity    DM Well controlled. She is taking glipizide 5 mg BID, metformin 1000 mg BID, and victoza 1.8 mg daily. Requesting to restart invokana however I explained to her that I do not feel invokana is necessary right now especially considering her A1c is at goal. LDl not at goal however she endorses  medication adherence taking atorvastatin 40 mg daily..  Lab Results  Component Value Date   HGBA1C 6.9 12/11/2020   Lab Results  Component Value Date   HGBA1C 7.0 (H) 07/19/2020   Lab Results  Component Value Date   LDLCALC 98 03/26/2020    Headaches Occurring daily in the morning when she goes to work. Topamax ineffective.  Denies any visual disturbances, nausea or vomiting , LE pain She stands up for 12 hours at a time at work. Endorses pain greater on right side of leg with standing for long periods of time at work. Also pain in left leg but decreased. She denies any back pain and states pain is only in bilateral legs. However she does have a history of low back pain from review of her history. 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  She is not a smoker.    Review of Systems  Constitutional: Negative for fever, malaise/fatigue and weight loss.  HENT: Negative.  Negative for nosebleeds.   Eyes: Negative.  Negative for blurred vision, double vision and photophobia.  Respiratory: Negative.  Negative for cough and shortness of breath.   Cardiovascular: Negative.  Negative for chest pain, palpitations and leg swelling.  Gastrointestinal: Negative.  Negative for heartburn, nausea and vomiting.  Musculoskeletal: Negative for myalgias.       SEE HPI  Neurological: Negative.  Negative for dizziness, focal weakness, seizures and headaches.  Psychiatric/Behavioral: Negative.  Negative for suicidal ideas.    Past Medical History:  Diagnosis Date  . COVID-19   . Diabetes mellitus ([place])   . Morbid obesity ([place])     History reviewed. No pertinent surgical history.  Family History  Problem Relation Age of Onset  . Diabetes Maternal Grandmother   . Diabetes Maternal Grandfather   . Other Neg Hx     Social History Reviewed with no changes to be made today.   Outpatient Medications Prior to Visit  Medication Sig Dispense Refill  . acetaminophen (TYLENOL) 500 MG tablet Take 2 tablets (1,000 mg total) by mouth every 6 (six) hours as needed. 30 tablet 0  . Blood Glucose Monitoring Suppl (ONETOUCH VERIO REFLECT) w/Device KIT Use to check blood sugar TID. E11.69 1 kit 0  . ibuprofen (ADVIL) 600 MG tablet Take 1 tablet (600 mg total) by mouth every 8 (eight) hours as needed. 60 tablet 1  . atorvastatin (LIPITOR) 40 MG tablet Take 1 tablet (40 mg total) by mouth daily. 90 tablet 3  . canagliflozin (INVOKANA) 300 MG TABS tablet TAKE 1 TABLET (300 MG TOTAL) BY MOUTH DAILY BEFORE BREAKFAST. 30 tablet 1  . cyclobenzaprine (FLEXERIL) 5 MG tablet Take 1 tablet (5 mg total) by mouth 3 (three) times daily as needed for muscle spasms. 30 tablet 1  . glipiZIDE (GLUCOTROL) 5 MG tablet TAKE 0.5  TABLETS (2.5 MG TOTAL) BY MOUTH 2 (TWO) TIMES DAILY BEFORE A MEAL. 30 tablet 3  . glucose blood (ONETOUCH VERIO) test strip Use to check blood sugar TID. E11.69 100 each 6  . Insulin Pen Needle (B-D UF III MINI PEN NEEDLES) 31G X 5 MM MISC Use as instructed. Inject into the skin once nightly. 100 each 6  . Lancets (ONETOUCH DELICA PLUS [id number]) MISC Use to check blood sugar TID. E11.69 100 each 6  . liraglutide (VICTOZA) 18 MG/3ML SOPN INJECT 1.8 MG INTO THE SKIN DAILY. 9 mL 2  . metFORMIN (GLUCOPHAGE) 1000 MG tablet TAKE 1 TABLET (1,000 MG TOTAL) BY  MOUTH 2 (TWO) TIMES DAILY WITH A MEAL. 180 tablet 2  . topiramate (TOPAMAX) 25 MG tablet Take 1 tablet (25 mg total) by mouth 2 (two) times daily. 60 tablet 2   No facility-administered medications prior to visit.    Allergies  Allergen Reactions  . Jardiance [Empagliflozin] Other (See Comments)    HEADACHE/DIZZINESS       Objective:    BP 121/85   Pulse 76   Resp 18   Ht 5' 6"  (1.676 m)   Wt 224 lb 3.2 oz (101.7 kg)   SpO2 100%   BMI 36.19 kg/m  Wt Readings from Last 3 Encounters:  12/11/20 224 lb 3.2 oz (101.7 kg)  05/03/20 245 lb (111.1 kg)  03/26/20 (!) 242 lb (109.8 kg)    Physical Exam Vitals and nursing note reviewed.  Constitutional:      Appearance: She is well-developed.  HENT:     Head: Normocephalic and atraumatic.  Cardiovascular:     Rate and Rhythm: Normal rate and regular rhythm.     Heart sounds: Normal heart sounds. No murmur heard. No friction rub. No gallop.   Pulmonary:     Effort: Pulmonary effort is normal. No tachypnea or respiratory distress.     Breath sounds: Normal breath sounds. No decreased breath sounds, wheezing, rhonchi or rales.  Chest:     Chest wall: No tenderness.  Abdominal:     General: Bowel sounds are normal.     Palpations: Abdomen is soft.  Musculoskeletal:        General: No swelling, tenderness, deformity or signs of injury. Normal range of motion.     Cervical back:  Normal range of motion.     Right lower leg: No edema.     Left lower leg: No edema.  Skin:    General: Skin is warm and dry.  Neurological:     Mental Status: She is alert and oriented to person, place, and time.     Coordination: Coordination normal.  Psychiatric:        Behavior: Behavior normal. Behavior is cooperative.        Thought Content: Thought content normal.        Judgment: Judgment normal.          Patient has been counseled extensively about nutrition and exercise as well as the importance of adherence with medications and regular follow-up. The patient was given clear instructions to go to ER or return to medical center if symptoms don't improve, worsen or new problems develop. The patient verbalized understanding.   Follow-up: Return in about 3 months (around 03/12/2021).   Gildardo Pounds, FNP-BC [facility] [place], [place]   12/11/2020, 10:57 AM

## 2020-12-12 ENCOUNTER — Other Ambulatory Visit: Payer: Self-pay

## 2020-12-12 LAB — CMP14+EGFR
ALT: 15 IU/L (ref 0–32)
AST: 20 IU/L (ref 0–40)
Albumin/Globulin Ratio: 1.6 (ref 1.2–2.2)
Albumin: 4.3 g/dL (ref 3.8–4.8)
Alkaline Phosphatase: 67 IU/L (ref 44–121)
BUN/Creatinine Ratio: 17 (ref 9–23)
BUN: 10 mg/dL (ref 6–20)
Bilirubin Total: 0.2 mg/dL (ref 0.0–1.2)
CO2: 22 mmol/L (ref 20–29)
Calcium: 9.6 mg/dL (ref 8.7–10.2)
Chloride: 99 mmol/L (ref 96–106)
Creatinine, Ser: 0.59 mg/dL (ref 0.57–1.00)
Globulin, Total: 2.7 g/dL (ref 1.5–4.5)
Glucose: 141 mg/dL — ABNORMAL HIGH (ref 65–99)
Potassium: 4.6 mmol/L (ref 3.5–5.2)
Sodium: 136 mmol/L (ref 134–144)
Total Protein: 7 g/dL (ref 6.0–8.5)
eGFR: 117 mL/min/{1.73_m2} (ref >59)

## 2020-12-12 LAB — CBC
Hematocrit: 38.5 % (ref 34.0–46.6)
Hemoglobin: 11.9 g/dL (ref 11.1–15.9)
MCH: 23 pg — ABNORMAL LOW (ref 26.6–33.0)
MCHC: 30.9 g/dL — ABNORMAL LOW (ref 31.5–35.7)
MCV: 75 fL — ABNORMAL LOW (ref 79–97)
Platelets: 225 10*3/uL (ref 150–450)
RBC: 5.17 x10E6/uL (ref 3.77–5.28)
RDW: 18.3 % — ABNORMAL HIGH (ref 11.7–15.4)
WBC: 5.1 10*3/uL (ref 3.4–10.8)

## 2020-12-12 LAB — LIPID PANEL
Chol/HDL Ratio: 3.6 ratio (ref 0.0–4.4)
Cholesterol, Total: 174 mg/dL (ref 100–199)
HDL: 49 mg/dL (ref >39)
LDL Chol Calc (NIH): 96 mg/dL (ref 0–99)
Triglycerides: 166 mg/dL — ABNORMAL HIGH (ref 0–149)
VLDL Cholesterol Cal: 29 mg/dL (ref 5–40)

## 2020-12-12 LAB — THYROID PANEL WITH TSH
Free Thyroxine Index: 1.4 (ref 1.2–4.9)
T3 Uptake Ratio: 24 % (ref 24–39)
T4, Total: 6 ug/dL (ref 4.5–12.0)
TSH: 3.57 u[IU]/mL (ref 0.450–4.500)

## 2020-12-17 ENCOUNTER — Other Ambulatory Visit: Payer: Self-pay

## 2020-12-19 ENCOUNTER — Other Ambulatory Visit: Payer: Self-pay

## 2021-01-15 ENCOUNTER — Encounter (HOSPITAL_COMMUNITY): Payer: Self-pay

## 2021-01-15 ENCOUNTER — Ambulatory Visit (HOSPITAL_COMMUNITY)
Admission: EM | Admit: 2021-01-15 | Discharge: 2021-01-15 | Disposition: A | Discharge status: Home / Self Care | Payer: 59 | Attending: Emergency Medicine | Admitting: Emergency Medicine

## 2021-01-15 ENCOUNTER — Other Ambulatory Visit: Payer: Self-pay

## 2021-01-15 DIAGNOSIS — H6123 Impacted cerumen, bilateral: Secondary | ICD-10-CM

## 2021-01-15 DIAGNOSIS — H9203 Otalgia, bilateral: Secondary | ICD-10-CM

## 2021-01-15 NOTE — ED Provider Notes (Signed)
[place]  ____________________________________________  Time seen: Approximately 11:24 AM  I have reviewed the triage vital signs and the nursing notes.   HISTORY  Chief Complaint Otalgia   Historian Patient    HPI Lisa [name] is a 40 y.o. female with a history of diabetes, presents to the emergency department with bilateral ear pain.  Patient reports that she comes to a local urgent care every 6 months for ear lavage due to cerumen impaction.  She denies fever and chills.  Denies a history of otitis media.  No discharge from the bilateral ears.  No other alleviating measures have been attempted.   Past Medical History:  Diagnosis Date  . COVID-19   . Diabetes mellitus ([place])   . Morbid obesity ([place])      Immunizations up to date:  Yes.     Past Medical History:  Diagnosis Date  . COVID-19   . Diabetes mellitus ([place])   . Morbid obesity [facility])     Patient Active Problem List   Diagnosis Date Noted  . Diabetes mellitus ([place])   . Type 2 diabetes mellitus without complication, without long-term current use of insulin ([place]) 09/22/2019  . Low back pain 02/28/2019  . Supervision of high-risk pregnancy 08/15/2012  . Short cervix, antepartum 08/08/2012  . Twin gestation, dichorionic diamniotic 07/18/2012  . Gestational diabetes mellitus, antepartum 07/18/2012    History reviewed. No pertinent surgical history.  Prior to Admission medications   Medication Sig Start Date End Date Taking? Authorizing Provider  acetaminophen (TYLENOL) 500 MG tablet Take 2 tablets (1,000 mg total) by mouth every 6 (six) hours as needed. 12/22/19   Darr, Edison Nasuti, PA-C  amitriptyline (ELAVIL) 10 MG tablet Take 1 tablet (10 mg total) by mouth at bedtime. 12/11/20   Gildardo Pounds, NP  atorvastatin (LIPITOR) 40 MG tablet Take 1 tablet (40 mg total) by mouth daily. 12/11/20   Gildardo Pounds, NP  Blood Glucose Monitoring Suppl (ONETOUCH VERIO REFLECT) w/Device KIT Use to  check blood sugar TID. E11.69 03/26/20   Charlott Rakes, MD  cyclobenzaprine (FLEXERIL) 5 MG tablet Take 1 tablet (5 mg total) by mouth 3 (three) times daily as needed for muscle spasms. 12/11/20   Gildardo Pounds, NP  glipiZIDE (GLUCOTROL) 5 MG tablet TAKE 0.5 TABLETS (2.5 MG TOTAL) BY MOUTH 2 (TWO) TIMES DAILY BEFORE A MEAL. 12/11/20 01/10/21  Gildardo Pounds, NP  glucose blood (ONETOUCH VERIO) test strip Use to check blood sugar 3 times daily. E11.69 12/11/20   Gildardo Pounds, NP  ibuprofen (ADVIL) 600 MG tablet Take 1 tablet (600 mg total) by mouth every 8 (eight) hours as needed. 12/22/19   Darr, Edison Nasuti, PA-C  Insulin Pen Needle (B-D UF III MINI PEN NEEDLES) 31G X 5 MM MISC Use as instructed. Inject into the skin once nightly. 12/11/20   Gildardo Pounds, NP  Lancets [facility] DELICA PLUS [id number]) MISC Use to check blood sugar 3 times daily. E11.69 12/11/20   Gildardo Pounds, NP  liraglutide (VICTOZA) 18 MG/3ML SOPN INJECT 1.8 MG INTO THE SKIN DAILY. 12/11/20 12/11/21  Gildardo Pounds, NP  metFORMIN (GLUCOPHAGE) 1000 MG tablet TAKE 1 TABLET (1,000 MG TOTAL) BY MOUTH 2 (TWO) TIMES DAILY WITH A MEAL. 12/11/20 12/11/21  Gildardo Pounds, NP    Allergies Jardiance [empagliflozin]  Family History  Problem Relation Age of Onset  . Diabetes Maternal Grandmother   . Diabetes Maternal Grandfather   . Other Neg Hx  Social History Social History   Tobacco Use  . Smoking status: Never Smoker  . Smokeless tobacco: Never Used  Vaping Use  . Vaping Use: Never used  Substance Use Topics  . Alcohol use: No  . 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  Respiratory: no cough. No SOB/ use of accessory muscles to breath Gastrointestinal:   No nausea, no vomiting.  No diarrhea.  No constipation. Musculoskeletal: Negative for musculoskeletal pain. Skin: Negative for rash,  abrasions, lacerations, ecchymosis.    ____________________________________________   PHYSICAL EXAM:  VITAL SIGNS: ED Triage Vitals  Enc Vitals Group     BP      Pulse      Resp      Temp      Temp src      SpO2      Weight      Height      Head Circumference      Peak Flow      Pain Score      Pain Loc      Pain Edu?      Excl. in [place]?      Constitutional: Alert and oriented. Well appearing and in no acute distress. Eyes: Conjunctivae are normal. PERRL. EOMI. Head: Atraumatic. ENT:      Ears: Patient has cerumen impaction bilaterally      Nose: No congestion/rhinnorhea.      Mouth/Throat: Mucous membranes are moist.  Neck: No stridor. FROM.   Cardiovascular: Normal rate, regular rhythm. Normal S1 and S2.  Good peripheral circulation. Respiratory: Normal respiratory effort without tachypnea or retractions. Lungs CTAB. Good air entry to the bases with no decreased or absent breath sounds Gastrointestinal: Bowel sounds x 4 quadrants. Soft and nontender to palpation. No guarding or rigidity. No distention. Musculoskeletal: Full range of motion to all extremities. No obvious deformities noted Neurologic:  Normal for age. No gross focal neurologic deficits are appreciated.  Skin:  Skin is warm, dry and intact. No rash noted. Psychiatric: Mood and affect are normal for age. Speech and behavior are normal.   ____________________________________________   LABS (all labs ordered are listed, but only abnormal results are displayed)  Labs Reviewed - No data to display ____________________________________________  EKG   ____________________________________________  RADIOLOGY   No results found.  ____________________________________________    PROCEDURES  Procedure(s) performed:     Procedures     Medications - No data to display   ____________________________________________   INITIAL IMPRESSION / ASSESSMENT AND PLAN / ED COURSE  Pertinent labs  & imaging results that were available during my care of the patient were reviewed by me and considered in my medical decision making (see chart for details).       Assessment and plan:  Ear pain: 40 year old female presents to the emergency department with bilateral ear pain.  Patient stated that she felt much improved after ear lavage.  TMs were pearly after cerumen disimpaction.      ____________________________________________  FINAL CLINICAL IMPRESSION(S) / ED DIAGNOSES  Final diagnoses:  Otalgia of both ears      NEW MEDICATIONS STARTED DURING THIS VISIT:  ED Discharge Orders    None          This chart was dictated using voice recognition software/Dragon. Despite best efforts to proofread, errors can occur which can change the meaning. Any change  was purely unintentional.     Lannie Fields, PA-C 01/15/21 1220

## 2021-01-15 NOTE — ED Triage Notes (Signed)
Pt presents with bilateral ear pain and fullness X 3 days.  

## 2021-01-29 ENCOUNTER — Other Ambulatory Visit: Payer: Self-pay

## 2021-02-12 ENCOUNTER — Other Ambulatory Visit: Payer: Self-pay

## 2021-02-12 ENCOUNTER — Encounter: Payer: Self-pay | Admitting: Nurse Practitioner

## 2021-02-12 ENCOUNTER — Ambulatory Visit: Payer: 59 | Attending: Nurse Practitioner | Admitting: Nurse Practitioner

## 2021-02-12 DIAGNOSIS — G44229 Chronic tension-type headache, not intractable: Secondary | ICD-10-CM | POA: Diagnosis not present

## 2021-02-12 DIAGNOSIS — Z794 Long term (current) use of insulin: Secondary | ICD-10-CM | POA: Diagnosis not present

## 2021-02-12 DIAGNOSIS — E1165 Type 2 diabetes mellitus with hyperglycemia: Secondary | ICD-10-CM

## 2021-02-12 MED ORDER — GLIPIZIDE 5 MG PO TABS: ORAL_TABLET | ORAL | 3 refills | Status: DC

## 2021-02-12 MED ORDER — METFORMIN HCL 1000 MG PO TABS: ORAL_TABLET | Freq: Two times a day (BID) | ORAL | 2 refills | Status: DC

## 2021-02-12 NOTE — Progress Notes (Signed)
Virtual Visit via Telephone Note Due to national recommendations of social distancing due to COVID 19, telehealth visit is felt to be most appropriate for this patient at this time.  I discussed the limitations, risks, security and privacy concerns of performing an evaluation and management service by telephone and the availability of in person appointments. I also discussed with the patient that there may be a patient responsible charge related to this service. The patient expressed understanding and agreed to proceed.    I connected with Lisa [name] on 02/12/21  at   3:10 PM EDT  EDT by telephone and verified that I am speaking with the correct person using two identifiers.  Location of Patient: Private Residence   [facility] Provider[facility]    Persons participating in Telemedicine visit: Bertram Denver FNP-BC [name]   Arabic Interpreter Aleed ID# [id number]   History of Present Illness: Telemedicine visit for: F/U tension headaches  Headaches have improved with elavil 10 mg however she does not take this daily. She only takes as needed. Headaches were occurring daily every morning. Treatments tried before elavil: topamax.  Denies visual disturbances, nausea, vomiting  Past Medical History:  Diagnosis Date   COVID-19    Diabetes mellitus (HCC)    Morbid obesity (HCC)     History reviewed. No pertinent surgical history.  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  HENT: Negative.  Negative for nosebleeds.   Eyes: Negative.  Negative for blurred vision, double vision and photophobia.  Respiratory: Negative.  Negative for cough and shortness of breath.   Cardiovascular: Negative.  Negative for chest pain, palpitations and leg swelling.  Gastrointestinal: Negative.  Negative for heartburn, nausea and vomiting.  Musculoskeletal: Negative.  Negative for myalgias.  Neurological: Negative.  Negative for dizziness, focal weakness, seizures and headaches.  Psychiatric/Behavioral: Negative.  Negative for suicidal ideas.    Assessment and Plan: Lisa [name] was seen today for migraine.  Diagnoses and all orders for this visit:  Chronic tension-type headache, not intractable Improved  Type 2 diabetes mellitus with hyperglycemia, with long-term current use of insulin (HCC) -     glipiZIDE (GLUCOTROL) 5 MG tablet; TAKE 0.5 TABLETS (2.5 MG TOTAL) BY MOUTH 2 (TWO) TIMES DAILY BEFORE A MEAL. -  metFORMIN (GLUCOPHAGE) 1000 MG tablet; TAKE 1 TABLET (1,000 MG TOTAL) BY MOUTH 2 (TWO) TIMES DAILY WITH A MEAL.  Continue blood sugar control as discussed in office today, low carbohydrate diet, and regular physical exercise as tolerated, 150 minutes per week (30 min each day, 5 days per week, or 50 min 3 days per week). Keep blood sugar logs with fasting goal of 90-130 mg/dl, post prandial (after you eat)  less than 180.  For Hypoglycemia: BS <60 and Hyperglycemia BS >400; contact the clinic ASAP. Annual eye exams and foot exams are recommended.   Follow Up Instructions Return for f/u in July as already scheduled.     I discussed the assessment and treatment plan with the patient. The patient was provided an opportunity to ask questions and all were answered. The patient agreed with the plan and demonstrated an understanding of the instructions.   The patient was advised to call back or seek an in-person evaluation if the symptoms worsen or if the condition fails to improve as anticipated.  I provided 9 minutes of non-face-to-face time during this encounter including median intraservice time, reviewing previous notes, labs, imaging, medications and explaining diagnosis and management.  Claiborne Rigg, FNP-BC

## 2021-03-12 ENCOUNTER — Ambulatory Visit: Payer: 59 | Admitting: Nurse Practitioner

## 2021-04-21 ENCOUNTER — Encounter: Payer: Self-pay | Admitting: Nurse Practitioner

## 2021-04-21 ENCOUNTER — Ambulatory Visit: Payer: No Typology Code available for payment source | Attending: Nurse Practitioner | Admitting: Nurse Practitioner

## 2021-04-21 ENCOUNTER — Other Ambulatory Visit: Payer: Self-pay

## 2021-04-21 DIAGNOSIS — Z794 Long term (current) use of insulin: Secondary | ICD-10-CM

## 2021-04-21 DIAGNOSIS — E1165 Type 2 diabetes mellitus with hyperglycemia: Secondary | ICD-10-CM | POA: Diagnosis not present

## 2021-04-21 DIAGNOSIS — E785 Hyperlipidemia, unspecified: Secondary | ICD-10-CM | POA: Diagnosis not present

## 2021-04-21 NOTE — Progress Notes (Signed)
Virtual Visit via Telephone Note Due to national recommendations of social distancing due to [place] 19, telehealth visit is felt to be most appropriate for this patient at this time.  I discussed the limitations, risks, security and privacy concerns of performing an evaluation and management service by telephone and the availability of in person appointments. I also discussed with the patient that there may be a patient responsible charge related to this service. The patient expressed understanding and agreed to proceed.    I connected with Lisa [name] on 04/21/21  at   2:10 PM EDT  EDT by telephone and verified that I am speaking with the correct person using two identifiers.  Location of Patient: Private Residence   Location of Provider: [place] and [employer] Office    Persons participating in Telemedicine visit: Geryl Rankins FNP-BC [place]  Arabic interpreter ID number [id number]   History of Present Illness: Telemedicine visit for: DM  She has a past medical history of COVID-19, Diabetes mellitus ([place]), and Morbid obesity ([place]).   DM 2 She is not monitoring her blood glucose at home daily. Diet is not optimal in regard to her diabetes.  She is is currently taking glipizide 2.5 mg twice daily, Victoza 1.8 mg daily and metformin 1000 mg twice daily.  Denies any symptoms of hypo or hyperglycemia.  LDL is not at goal with atorvastatin 40 mg daily. Lab Results  Component Value Date   HGBA1C 6.9 12/11/2020   Lab Results  Component Value Date   [place] 96 12/11/2020      Past Medical History:  Diagnosis Date   COVID-19    Diabetes mellitus ([place])    Morbid obesity ([place])     No past surgical history on file.  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  HENT: Negative.  Negative for nosebleeds.   Eyes: Negative.  Negative for blurred vision, double vision and photophobia.  Respiratory: Negative.  Negative for cough and shortness of breath.   Cardiovascular: Negative.  Negative for chest pain, palpitations and leg swelling.  Gastrointestinal:  Negative for heartburn (tele), nausea and vomiting.  Musculoskeletal: Negative.  Negative for myalgias.  Neurological: Negative.  Negative for dizziness, focal weakness, seizures and headaches.  Psychiatric/Behavioral: Negative.  Negative for suicidal  ideas.    Assessment and Plan: Diagnoses and all orders for this visit:  Type 2 diabetes mellitus with hyperglycemia, with long-term current use of insulin ([place]) -     Hemoglobin A1c; Future -     CMP14+EGFR; Future -     Microalbumin / creatinine urine ratio; Future Continue blood sugar control as discussed in office today, low carbohydrate diet, and regular physical exercise as tolerated, 150 minutes per week (30 min each day, 5 days per week, or 50 min 3 days per week). Keep blood sugar logs with fasting goal of 90-130  mg/dl, post prandial (after you eat) less than 180.  For Hypoglycemia: BS <60 and Hyperglycemia BS >400; contact the clinic ASAP. Annual eye exams and foot exams are recommended.   Dyslipidemia, goal LDL below 70 INSTRUCTIONS: Work on a low fat, heart healthy diet and participate in regular aerobic exercise program by working out at least 150 minutes per week; 5 days a week-30 minutes per day. Avoid red meat/beef/steak,  fried foods. junk foods, sodas, sugary drinks, unhealthy snacking, alcohol and smoking.  Drink at least 80 oz of water per day and monitor your carbohydrate intake daily.      Follow Up Instructions Return in about 3 months (around 07/22/2021).     I discussed the assessment and treatment plan with the patient. The patient was provided an opportunity to ask questions and all were answered. The patient agreed with the plan and demonstrated an understanding of the instructions.   The patient was advised to call back or seek an in-person evaluation if the symptoms worsen or if the condition fails to improve as anticipated.  I provided 11 minutes of non-face-to-face time during this encounter including median intraservice time, reviewing previous notes, labs, imaging, medications and explaining diagnosis and management.  Gildardo Pounds, FNP-BC

## 2021-08-01 ENCOUNTER — Encounter (HOSPITAL_COMMUNITY): Payer: Self-pay

## 2021-08-01 ENCOUNTER — Emergency Department (HOSPITAL_COMMUNITY)
Admission: EM | Admit: 2021-08-01 | Discharge: 2021-08-01 | Disposition: A | Discharge status: Home / Self Care | Payer: 59 | Attending: Emergency Medicine | Admitting: Emergency Medicine

## 2021-08-01 DIAGNOSIS — R55 Syncope and collapse: Secondary | ICD-10-CM | POA: Insufficient documentation

## 2021-08-01 DIAGNOSIS — Y990 Civilian activity done for income or pay: Secondary | ICD-10-CM | POA: Insufficient documentation

## 2021-08-01 DIAGNOSIS — Z5321 Procedure and treatment not carried out due to patient leaving prior to being seen by health care provider: Secondary | ICD-10-CM | POA: Diagnosis not present

## 2021-08-01 DIAGNOSIS — R42 Dizziness and giddiness: Secondary | ICD-10-CM | POA: Diagnosis not present

## 2021-08-01 DIAGNOSIS — E119 Type 2 diabetes mellitus without complications: Secondary | ICD-10-CM | POA: Insufficient documentation

## 2021-08-01 DIAGNOSIS — Z7984 Long term (current) use of oral hypoglycemic drugs: Secondary | ICD-10-CM | POA: Insufficient documentation

## 2021-08-01 LAB — CBG MONITORING, ED: Glucose-Capillary: 206 mg/dL — ABNORMAL HIGH (ref 70–99)

## 2021-08-01 NOTE — ED Triage Notes (Signed)
Pt arrived via EMS, from work, had syncopal episode. Hx of same. Aox4 by the time EMS arrived.

## 2021-08-01 NOTE — ED Provider Notes (Signed)
Emergency Medicine Provider Triage Evaluation Note  Lisa [name] , a 40 y.o. female  was evaluated in triage.  Pt complains of syncopal episode while at work today. On further questions, patient was reportedly unconscious for about 20 minutes and was nonarousable. Witness noticed foam coming out of her mouth. No jerking or contractions. Patient felt a little dizzy prior to episode, but was otherwise fine. She is on metformin for diabetes. She is feeling okay right now,  Review of Systems  Positive: Syncope, dizziness,  Negative: Abdominal pain, n/v/d, vision changes  Physical Exam  BP (!) 133/96 (BP Location: Right Arm)   Pulse 80   Temp 98 F (36.7 C) (Oral)   Resp 14   Ht 5\' 6"  (1.676 m)   Wt 103.6 kg   SpO2 100%   BMI 36.85 kg/m  Gen:   Awake, no distress   Resp:  Normal effort  MSK:   Moves extremities without difficulty  Other:  5/5 grip strength, EOM intact, eyes PERRL  Medical Decision Making  Medically screening exam initiated at 9:49 AM.  Appropriate orders placed.  Lisa [name] was informed that the remainder of the evaluation will be completed by another provider, this initial triage assessment does not replace that evaluation, and the importance of remaining in the ED until their evaluation is complete.  Syncope, possible seizure   Lisa [name] 08/01/21 14/02/22    1308, DO 08/01/21 1342

## 2021-08-01 NOTE — ED Notes (Addendum)
Patient's sister informed staff that she has made an appointment with pt's PCP and the pt states that she wants to be seen by PCP instead of in the ED. Patient ambulated out of the department independently and with no issues. RN aware.

## 2023-04-23 ENCOUNTER — Other Ambulatory Visit: Payer: Self-pay | Admitting: Family Medicine

## 2023-04-23 ENCOUNTER — Ambulatory Visit
Admission: RE | Admit: 2023-04-23 | Discharge: 2023-04-23 | Disposition: A | Discharge status: Home / Self Care | Payer: Managed Care, Other (non HMO) | Source: Ambulatory Visit | Attending: Family Medicine | Admitting: Family Medicine

## 2023-04-23 DIAGNOSIS — M5431 Sciatica, right side: Secondary | ICD-10-CM

## 2023-04-23 DIAGNOSIS — R058 Other specified cough: Secondary | ICD-10-CM

## 2023-05-21 ENCOUNTER — Telehealth: Payer: Self-pay

## 2023-05-21 NOTE — Telephone Encounter (Signed)
Carlena with [facility] left VM. She states we referred patient for an iron infusion and she wants to know "what happened with that"

## 2023-06-01 ENCOUNTER — Inpatient Hospital Stay: Payer: Commercial Managed Care - HMO | Attending: Hematology and Oncology

## 2023-06-01 ENCOUNTER — Inpatient Hospital Stay (HOSPITAL_BASED_OUTPATIENT_CLINIC_OR_DEPARTMENT_OTHER): Payer: Commercial Managed Care - HMO | Admitting: Hematology and Oncology

## 2023-06-01 VITALS — BP 141/82 | HR 74 | Temp 97.3°F | Resp 18 | Ht 66.0 in | Wt 229.9 lb

## 2023-06-01 DIAGNOSIS — D509 Iron deficiency anemia, unspecified: Secondary | ICD-10-CM | POA: Insufficient documentation

## 2023-06-01 HISTORY — DX: Iron deficiency anemia, unspecified: D50.9

## 2023-06-01 LAB — IRON AND IRON BINDING CAPACITY (CC-WL,HP ONLY)
Iron: 28 ug/dL (ref 28–170)
Saturation Ratios: 6 % — ABNORMAL LOW (ref 10.4–31.8)
TIBC: 442 ug/dL (ref 250–450)
UIBC: 414 ug/dL (ref 148–442)

## 2023-06-01 LAB — CBC WITH DIFFERENTIAL (CANCER CENTER ONLY)
Abs Immature Granulocytes: 0.01 10*3/uL (ref 0.00–0.07)
Basophils Absolute: 0 10*3/uL (ref 0.0–0.1)
Basophils Relative: 0 %
Eosinophils Absolute: 0.1 10*3/uL (ref 0.0–0.5)
Eosinophils Relative: 2 %
HCT: 33.5 % — ABNORMAL LOW (ref 36.0–46.0)
Hemoglobin: 10.6 g/dL — ABNORMAL LOW (ref 12.0–15.0)
Immature Granulocytes: 0 %
Lymphocytes Relative: 48 %
Lymphs Abs: 2.2 10*3/uL (ref 0.7–4.0)
MCH: 22.8 pg — ABNORMAL LOW (ref 26.0–34.0)
MCHC: 31.6 g/dL (ref 30.0–36.0)
MCV: 72.2 fL — ABNORMAL LOW (ref 80.0–100.0)
Monocytes Absolute: 0.4 10*3/uL (ref 0.1–1.0)
Monocytes Relative: 8 %
Neutro Abs: 1.9 10*3/uL (ref 1.7–7.7)
Neutrophils Relative %: 42 %
Platelet Count: 282 10*3/uL (ref 150–400)
RBC: 4.64 MIL/uL (ref 3.87–5.11)
RDW: 16.2 % — ABNORMAL HIGH (ref 11.5–15.5)
WBC Count: 4.6 10*3/uL (ref 4.0–10.5)
nRBC: 0 % (ref 0.0–0.2)

## 2023-06-02 ENCOUNTER — Encounter: Payer: Self-pay | Admitting: Hematology and Oncology

## 2023-06-02 LAB — FERRITIN: Ferritin: 5 ng/mL — ABNORMAL LOW (ref 11–307)

## 2023-06-02 NOTE — Progress Notes (Signed)
[facility] CONSULT NOTE  Patient Care Team: Claiborne Rigg, NP as PCP - General (Nurse Practitioner) Claiborne Rigg, NP as Nurse Practitioner (Nurse Practitioner)  CHIEF COMPLAINTS/PURPOSE OF CONSULTATION:  Iron deficiency anemia  HISTORY OF PRESENTING ILLNESS:  Lisa [name] 42 y.o. female is here because of chronic refractory iron deficiency anemia.  Patient has had been on oral iron therapy for over a year and reports increasing abdominal pain and constipation but no significant improvement in her iron levels.  She has been experiencing fatigue generalized weakness and shortness of breath to minimal exertion.  She is also noticed some brittleness of the hair and nails.  This discussion was done using an Arabic interpreter.  I reviewed her records extensively and collaborated the history with the patient.   MEDICAL HISTORY:  Past Medical History:  Diagnosis Date   COVID-19    Diabetes mellitus (H[facility])    Morbid obesity ([facility])     SURGICAL HISTORY: No past surgical history on file.  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  MEDICATIONS:  Current Outpatient Medications  Medication Sig Dispense Refill   acetaminophen (TYLENOL) 500 MG tablet Take 2 tablets (1,000 mg total) by mouth every 6 (six) hours as needed. 30 tablet 0   atorvastatin (LIPITOR) 40 MG tablet Take 1 tablet (40 mg total) by mouth daily. 90 tablet 3   glucose blood (ONETOUCH VERIO) test strip Use to check blood sugar 3 times daily. E11.69 100 each 6   Insulin Pen Needle (B-D UF III MINI PEN NEEDLES) 31G X 5 MM MISC Use as instructed. Inject into the skin once nightly. 100 each 6   glipiZIDE (GLUCOTROL) 5 MG tablet TAKE 0.5 TABLETS (2.5 MG TOTAL) BY MOUTH 2 (TWO) TIMES DAILY BEFORE A MEAL. 60 tablet 3   ibuprofen (ADVIL) 600 MG tablet Take 1 tablet (600 mg total) by mouth every 8 (eight) hours as needed. (Patient taking differently: Take 600 mg by mouth every 8 (eight) hours as needed. PRN) 60 tablet 1   metFORMIN (GLUCOPHAGE) 1000 MG tablet TAKE 1 TABLET (1,000 MG TOTAL) BY MOUTH 2 (TWO) TIMES DAILY WITH A MEAL. 180 tablet 2   No current facility-administered medications for this visit.    REVIEW OF SYSTEMS:   Constitutional: Denies fevers, chills or  abnormal night sweats All other systems were reviewed with the patient and are negative.  PHYSICAL EXAMINATION: ECOG PERFORMANCE STATUS: 1 - Symptomatic but completely ambulatory  Vitals:   06/01/23 1551  BP: (!) 141/82  Pulse: 74  Resp: 18  Temp: (!) 97.3 F (36.3 C)  SpO2: 100%   Filed Weights   06/01/23 1551  Weight: 229 lb 14.4 oz (104.3 kg)    GENERAL:alert, no distress and comfortable  LABORATORY DATA:  I have reviewed the data as listed Lab Results  Component Value Date   WBC 4.6 06/01/2023   HGB 10.6 (L) 06/01/2023   HCT 33.5 (L) 06/01/2023   MCV 72.2 (L) 06/01/2023   PLT 282 06/01/2023    Lab Results  Component Value Date   NA 136 12/11/2020   K 4.6 12/11/2020   CL 99 12/11/2020   CO2 22 12/11/2020    RADIOGRAPHIC STUDIES: I have personally reviewed the radiological reports and agreed with the findings in the report.  ASSESSMENT AND PLAN:  Iron deficiency anemia Lab review: 11/20/2022: Hemoglobin 10.7, MCV 71.3, creatinine 0.49, iron saturation 11%, B12 259  Longstanding history of iron deficiency anemia with intolerance to oral iron therapy with iron causing constipation and abdominal pain.  Patient complains of fatigue generalized weakness and shortness of breath to minimal exertion.  Recommend 3 doses of IV iron therapy Recheck labs in 3 months. Referral to gastroenterology for upper endoscopy and colonoscopy.  All questions were answered. The patient knows to call the clinic with any problems, questions or concerns.    Tamsen Meek, MD 06/02/23

## 2023-06-02 NOTE — Assessment & Plan Note (Signed)
Lab review: 11/20/2022: Hemoglobin 10.7, MCV 71.3, creatinine 0.49, iron saturation 11%, B12 259  Longstanding history of iron deficiency anemia with intolerance to oral iron therapy with iron causing constipation and abdominal pain.  Patient complains of fatigue generalized weakness and shortness of breath to minimal exertion.  Recommend 3 doses of IV iron therapy Recheck labs in 3 months. Referral to gastroenterology for upper endoscopy and colonoscopy.

## 2023-06-14 ENCOUNTER — Inpatient Hospital Stay: Payer: Commercial Managed Care - HMO

## 2023-06-14 VITALS — BP 132/84 | HR 72 | Temp 98.8°F | Resp 17

## 2023-06-14 DIAGNOSIS — D509 Iron deficiency anemia, unspecified: Secondary | ICD-10-CM

## 2023-06-14 MED ORDER — SODIUM CHLORIDE 0.9 % IV SOLN: Freq: Once | INTRAVENOUS | Status: AC

## 2023-06-14 MED ORDER — SODIUM CHLORIDE 0.9 % IV SOLN
300.0000 mg | Freq: Once | INTRAVENOUS | Status: AC
  Administered 2023-06-14: 300 mg via INTRAVENOUS
  Filled 2023-06-14: qty 300

## 2023-06-14 NOTE — Patient Instructions (Signed)
Iron Sucrose Injection What is this medication? IRON SUCROSE (EYE ern SOO krose) treats low levels of iron (iron deficiency anemia) in people with kidney disease. Iron is a mineral that plays an important role in making red blood cells, which carry oxygen from your lungs to the rest of your body. This medicine may be used for other purposes; ask your health care provider or pharmacist if you have questions. COMMON BRAND NAME(S): Venofer What should I tell my care team before I take this medication? They need to know if you have any of these conditions: Anemia not caused by low iron levels Heart disease High levels of iron in the blood Kidney disease Liver disease An unusual or allergic reaction to iron, other medications, foods, dyes, or preservatives Pregnant or trying to get pregnant Breastfeeding How should I use this medication? This medication is for infusion into a vein. It is given in a hospital or clinic setting. Talk to your care team about the use of this medication in children. While this medication may be prescribed for children as young as 2 years for selected conditions, precautions do apply. Overdosage: If you think you have taken too much of this medicine contact a poison control center or emergency room at once. NOTE: This medicine is only for you. Do not share this medicine with others. What if I miss a dose? Keep appointments for follow-up doses. It is important not to miss your dose. Call your care team if you are unable to keep an appointment. What may interact with this medication? Do not take this medication with any of the following: Deferoxamine Dimercaprol Other iron products This medication may also interact with the following: Chloramphenicol Deferasirox This list may not describe all possible interactions. Give your health care provider a list of all the medicines, herbs, non-prescription drugs, or dietary supplements you use. Also tell them if you smoke,  drink alcohol, or use illegal drugs. Some items may interact with your medicine. What should I watch for while using this medication? Visit your care team regularly. Tell your care team if your symptoms do not start to get better or if they get worse. You may need blood work done while you are taking this medication. You may need to follow a special diet. Talk to your care team. Foods that contain iron include: whole grains/cereals, dried fruits, beans, or peas, leafy green vegetables, and organ meats (liver, kidney). What side effects may I notice from receiving this medication? Side effects that you should report to your care team as soon as possible: Allergic reactions--skin rash, itching, hives, swelling of the face, lips, tongue, or throat Low blood pressure--dizziness, feeling faint or lightheaded, blurry vision Shortness of breath Side effects that usually do not require medical attention (report to your care team if they continue or are bothersome): Flushing Headache Joint pain Muscle pain Nausea Pain, redness, or irritation at injection site This list may not describe all possible side effects. Call your doctor for medical advice about side effects. You may report side effects to FDA at 1-800-FDA-1088. Where should I keep my medication? This medication is given in a hospital or clinic. It will not be stored at home. NOTE: This sheet is a summary. It may not cover all possible information. If you have questions about this medicine, talk to your doctor, pharmacist, or health care provider.  2024 Elsevier/Gold Standard (2023-01-22 00:00:00)

## 2023-06-21 ENCOUNTER — Inpatient Hospital Stay: Payer: Commercial Managed Care - HMO

## 2023-06-21 ENCOUNTER — Other Ambulatory Visit: Payer: Self-pay

## 2023-06-21 VITALS — BP 146/89 | HR 67 | Temp 98.2°F | Resp 16

## 2023-06-21 DIAGNOSIS — D509 Iron deficiency anemia, unspecified: Secondary | ICD-10-CM | POA: Diagnosis not present

## 2023-06-21 MED ORDER — SODIUM CHLORIDE 0.9 % IV SOLN: Freq: Once | INTRAVENOUS | Status: AC

## 2023-06-21 MED ORDER — SODIUM CHLORIDE 0.9 % IV SOLN
300.0000 mg | Freq: Once | INTRAVENOUS | Status: AC
  Administered 2023-06-21: 300 mg via INTRAVENOUS
  Filled 2023-06-21: qty 300

## 2023-06-24 ENCOUNTER — Encounter: Payer: Self-pay | Admitting: Hematology and Oncology

## 2023-06-28 ENCOUNTER — Inpatient Hospital Stay: Payer: Commercial Managed Care - HMO

## 2023-06-28 VITALS — BP 130/80 | HR 64 | Resp 16

## 2023-06-28 DIAGNOSIS — D509 Iron deficiency anemia, unspecified: Secondary | ICD-10-CM

## 2023-06-28 MED ORDER — SODIUM CHLORIDE 0.9 % IV SOLN
300.0000 mg | Freq: Once | INTRAVENOUS | Status: AC
  Administered 2023-06-28: 300 mg via INTRAVENOUS
  Filled 2023-06-28: qty 300

## 2023-06-28 MED ORDER — SODIUM CHLORIDE 0.9 % IV SOLN: Freq: Once | INTRAVENOUS | Status: AC

## 2023-06-28 NOTE — Patient Instructions (Signed)
Iron Sucrose Injection What is this medication? IRON SUCROSE (EYE ern SOO krose) treats low levels of iron (iron deficiency anemia) in people with kidney disease. Iron is a mineral that plays an important role in making red blood cells, which carry oxygen from your lungs to the rest of your body. This medicine may be used for other purposes; ask your health care provider or pharmacist if you have questions. COMMON BRAND NAME(S): Venofer What should I tell my care team before I take this medication? They need to know if you have any of these conditions: Anemia not caused by low iron levels Heart disease High levels of iron in the blood Kidney disease Liver disease An unusual or allergic reaction to iron, other medications, foods, dyes, or preservatives Pregnant or trying to get pregnant Breastfeeding How should I use this medication? This medication is for infusion into a vein. It is given in a hospital or clinic setting. Talk to your care team about the use of this medication in children. While this medication may be prescribed for children as young as 2 years for selected conditions, precautions do apply. Overdosage: If you think you have taken too much of this medicine contact a poison control center or emergency room at once. NOTE: This medicine is only for you. Do not share this medicine with others. What if I miss a dose? Keep appointments for follow-up doses. It is important not to miss your dose. Call your care team if you are unable to keep an appointment. What may interact with this medication? Do not take this medication with any of the following: Deferoxamine Dimercaprol Other iron products This medication may also interact with the following: Chloramphenicol Deferasirox This list may not describe all possible interactions. Give your health care provider a list of all the medicines, herbs, non-prescription drugs, or dietary supplements you use. Also tell them if you smoke,  drink alcohol, or use illegal drugs. Some items may interact with your medicine. What should I watch for while using this medication? Visit your care team regularly. Tell your care team if your symptoms do not start to get better or if they get worse. You may need blood work done while you are taking this medication. You may need to follow a special diet. Talk to your care team. Foods that contain iron include: whole grains/cereals, dried fruits, beans, or peas, leafy green vegetables, and organ meats (liver, kidney). What side effects may I notice from receiving this medication? Side effects that you should report to your care team as soon as possible: Allergic reactions--skin rash, itching, hives, swelling of the face, lips, tongue, or throat Low blood pressure--dizziness, feeling faint or lightheaded, blurry vision Shortness of breath Side effects that usually do not require medical attention (report to your care team if they continue or are bothersome): Flushing Headache Joint pain Muscle pain Nausea Pain, redness, or irritation at injection site This list may not describe all possible side effects. Call your doctor for medical advice about side effects. You may report side effects to FDA at 1-800-FDA-1088. Where should I keep my medication? This medication is given in a hospital or clinic. It will not be stored at home. NOTE: This sheet is a summary. It may not cover all possible information. If you have questions about this medicine, talk to your doctor, pharmacist, or health care provider.  2024 Elsevier/Gold Standard (2023-01-22 00:00:00)

## 2023-06-28 NOTE — Progress Notes (Signed)
Pt observed for 30 minutes post Venofer infusion. Pt tolerated Tx well w/out incident. VSS at discharge.  Ambulatory to lobby.

## 2023-08-11 ENCOUNTER — Encounter: Payer: Self-pay | Admitting: Hematology and Oncology

## 2023-09-03 ENCOUNTER — Other Ambulatory Visit: Payer: Self-pay | Admitting: *Deleted

## 2023-09-03 DIAGNOSIS — D509 Iron deficiency anemia, unspecified: Secondary | ICD-10-CM

## 2023-09-06 ENCOUNTER — Inpatient Hospital Stay: Payer: Self-pay | Attending: Hematology and Oncology

## 2023-09-06 ENCOUNTER — Encounter: Payer: Self-pay | Admitting: Hematology and Oncology

## 2023-09-06 DIAGNOSIS — D509 Iron deficiency anemia, unspecified: Secondary | ICD-10-CM | POA: Insufficient documentation

## 2023-09-06 LAB — IRON AND IRON BINDING CAPACITY (CC-WL,HP ONLY)
Iron: 82 ug/dL (ref 28–170)
Saturation Ratios: 24 % (ref 10.4–31.8)
TIBC: 336 ug/dL (ref 250–450)
UIBC: 254 ug/dL (ref 148–442)

## 2023-09-06 LAB — CBC WITH DIFFERENTIAL (CANCER CENTER ONLY)
Abs Immature Granulocytes: 0.02 10*3/uL (ref 0.00–0.07)
Basophils Absolute: 0 10*3/uL (ref 0.0–0.1)
Basophils Relative: 0 %
Eosinophils Absolute: 0.1 10*3/uL (ref 0.0–0.5)
Eosinophils Relative: 2 %
HCT: 38.5 % (ref 36.0–46.0)
Hemoglobin: 12.4 g/dL (ref 12.0–15.0)
Immature Granulocytes: 0 %
Lymphocytes Relative: 45 %
Lymphs Abs: 2.2 10*3/uL (ref 0.7–4.0)
MCH: 24.5 pg — ABNORMAL LOW (ref 26.0–34.0)
MCHC: 32.2 g/dL (ref 30.0–36.0)
MCV: 76.1 fL — ABNORMAL LOW (ref 80.0–100.0)
Monocytes Absolute: 0.3 10*3/uL (ref 0.1–1.0)
Monocytes Relative: 6 %
Neutro Abs: 2.2 10*3/uL (ref 1.7–7.7)
Neutrophils Relative %: 47 %
Platelet Count: 243 10*3/uL (ref 150–400)
RBC: 5.06 MIL/uL (ref 3.87–5.11)
RDW: 17.6 % — ABNORMAL HIGH (ref 11.5–15.5)
WBC Count: 4.8 10*3/uL (ref 4.0–10.5)
nRBC: 0 % (ref 0.0–0.2)

## 2023-09-07 LAB — FERRITIN: Ferritin: 53 ng/mL (ref 11–307)

## 2023-09-13 ENCOUNTER — Inpatient Hospital Stay: Payer: Self-pay | Admitting: Hematology and Oncology

## 2023-09-13 NOTE — Assessment & Plan Note (Deleted)
 Lab review: 11/20/2022: Hemoglobin 10.7, MCV 71.3, creatinine 0.49, iron  saturation 11%, B12 259 09/06/2023: Hemoglobin 12.4, MCV 76.1, iron  saturation 24%, ferritin 53   IV iron : 07/2023 Longstanding history of iron  deficiency anemia with intolerance to oral iron  therapy with iron  causing constipation and abdominal pain. Remarkable improvement in the hemoglobin and the iron  levels.  MCV still remains low and that could be a sign of thalassemia.   Patient complains of fatigue generalized weakness and shortness of breath to minimal exertion.   Recommend  Recheck labs in 3 months. Referral to gastroenterology for upper endoscopy and colonoscopy.

## 2023-09-30 ENCOUNTER — Encounter: Payer: Self-pay | Admitting: Hematology and Oncology

## 2023-10-05 ENCOUNTER — Ambulatory Visit: Payer: No Typology Code available for payment source | Admitting: Pharmacist

## 2023-10-05 ENCOUNTER — Encounter: Payer: Self-pay | Admitting: Pharmacist

## 2023-11-04 ENCOUNTER — Ambulatory Visit: Payer: Self-pay | Admitting: Nurse Practitioner

## 2023-12-24 ENCOUNTER — Other Ambulatory Visit

## 2023-12-24 ENCOUNTER — Other Ambulatory Visit: Payer: Self-pay

## 2023-12-24 ENCOUNTER — Encounter: Payer: Self-pay | Admitting: Nurse Practitioner

## 2023-12-24 ENCOUNTER — Ambulatory Visit (INDEPENDENT_AMBULATORY_CARE_PROVIDER_SITE_OTHER): Payer: Self-pay | Admitting: Nurse Practitioner

## 2023-12-24 ENCOUNTER — Encounter: Payer: Self-pay | Admitting: Hematology and Oncology

## 2023-12-24 VITALS — BP 120/84 | HR 75 | Temp 97.5°F | Wt 234.0 lb

## 2023-12-24 DIAGNOSIS — M25561 Pain in right knee: Secondary | ICD-10-CM | POA: Diagnosis not present

## 2023-12-24 DIAGNOSIS — D509 Iron deficiency anemia, unspecified: Secondary | ICD-10-CM | POA: Diagnosis not present

## 2023-12-24 DIAGNOSIS — E559 Vitamin D deficiency, unspecified: Secondary | ICD-10-CM | POA: Insufficient documentation

## 2023-12-24 DIAGNOSIS — E785 Hyperlipidemia, unspecified: Secondary | ICD-10-CM | POA: Insufficient documentation

## 2023-12-24 DIAGNOSIS — E119 Type 2 diabetes mellitus without complications: Secondary | ICD-10-CM

## 2023-12-24 DIAGNOSIS — G8929 Other chronic pain: Secondary | ICD-10-CM | POA: Diagnosis not present

## 2023-12-24 DIAGNOSIS — Z6837 Body mass index (BMI) 37.0-37.9, adult: Secondary | ICD-10-CM

## 2023-12-24 DIAGNOSIS — E662 Morbid (severe) obesity with alveolar hypoventilation: Secondary | ICD-10-CM

## 2023-12-24 DIAGNOSIS — M5441 Lumbago with sciatica, right side: Secondary | ICD-10-CM | POA: Diagnosis not present

## 2023-12-24 DIAGNOSIS — E66812 Obesity, class 2: Secondary | ICD-10-CM

## 2023-12-24 LAB — POCT GLYCOSYLATED HEMOGLOBIN (HGB A1C): Hemoglobin A1C: 8.8 % — AB (ref 4.0–5.6)

## 2023-12-24 MED ORDER — KETOROLAC TROMETHAMINE 30 MG/ML IJ SOLN
30.0000 mg | Freq: Once | INTRAMUSCULAR | Status: AC
  Administered 2023-12-24: 30 mg via INTRAMUSCULAR

## 2023-12-24 MED ORDER — METHYLPREDNISOLONE SODIUM SUCC 125 MG IJ SOLR
125.0000 mg | Freq: Once | INTRAMUSCULAR | Status: AC
  Administered 2023-12-24: 125 mg via INTRAMUSCULAR

## 2023-12-24 MED ORDER — TIRZEPATIDE 2.5 MG/0.5ML ~~LOC~~ SOAJ
2.5000 mg | SUBCUTANEOUS | 0 refills | Status: DC
  Filled 2023-12-24 – 2023-12-27 (×2): qty 2, 28d supply, fill #0

## 2023-12-24 MED ORDER — METFORMIN HCL 1000 MG PO TABS
1000.0000 mg | ORAL_TABLET | Freq: Two times a day (BID) | ORAL | 2 refills | Status: DC
  Filled 2023-12-24 (×2): qty 180, 90d supply, fill #0
  Filled 2024-04-10: qty 180, 90d supply, fill #1

## 2023-12-24 MED ORDER — IBUPROFEN 600 MG PO TABS
600.0000 mg | ORAL_TABLET | Freq: Three times a day (TID) | ORAL | 1 refills | Status: AC | PRN
  Filled 2023-12-24: qty 60, 20d supply, fill #0

## 2023-12-24 NOTE — Assessment & Plan Note (Signed)
 Lab Results  Component Value Date   HGBA1C 8.8 (A) 12/24/2023  Chronic medical condition currently uncontrolled Continue metformin  1000 mg twice daily Start Mounjaro  2.5 mg once weekly Patient counseled on low-carb diet, CBG goals discussed, she was referred for diabetes nutrition education Checking urine microalbumin labs, referral for diabetic eye exam placed Follow-up in 4 weeks

## 2023-12-24 NOTE — Progress Notes (Signed)
 New Patient Office Visit  Subjective:  Patient ID: Lisa [name], female    DOB: 08-25-1981  Age: 43 y.o. MRN: [id number]  CC:  Chief Complaint  Patient presents with   Establish Care   Diabetes    HPI Lisa [name] is a 43 y.o. female  has a past medical history of COVID-19, Diabetes mellitus (HCC), Iron  deficiency anemia (06/01/2023), and Morbid obesity (HCC).  Patient presented establish care for her chronic medical conditions.  Previous PCP was at [facility] physician, last visit was 6 months ago    Uncontrolled type 2 diabetes.  Currently on metformin  1000 mg twice daily, was on Trulicity but had stopped taking the medication when she lost her insurance, now has a new insurance.  She denies polyuria polyphagia polydipsia.  Takes atorvastatin  40 mg daily for hyperlipidemia  Chronic low back pain/chronic right knee pain.  Patient complains of worsening chronic low back pain and right knee pain, has been taking ibuprofen  and Voltaren gel that has not been helpful.  Her back pain is currently rated 8/10 knee pain rated 10/10.  She denies numbness, tingling urinary or bowel incontinence, trauma  Interpretation services provided by a medical interpreter     Past Medical History:  Diagnosis Date   COVID-19    Diabetes mellitus (HCC)    Iron  deficiency anemia 06/01/2023   Morbid obesity (HCC)     History reviewed. No pertinent surgical history.  Family History  Problem Relation Age of Onset   Diabetes type II Mother    Diabetes Mellitus II Father    Diabetes Maternal Grandmother    Diabetes Maternal Grandfather    Other Neg Hx     Social History   Socioeconomic History   Marital status: Married    Spouse name: Not on file   Number of children: 2   Years of education: Not on file   Highest education level: Not on file  Occupational History   Not on file  Tobacco Use   Smoking status: Never   Smokeless tobacco: Never  Vaping Use   Vaping status:  Never Used  Substance and Sexual Activity   Alcohol use: No   Drug use: Never   Sexual activity: Not Currently  Other Topics Concern   Not on file  Social History Narrative   Lives with her husband. He husband lives in [place]    Social Drivers of Health   Financial Resource Strain: Not on file  Food Insecurity: Not on file  Transportation Needs: Not on file  Physical Activity: Not on file  Stress: Not on file  Social Connections: Not on file  Intimate Partner Violence: Not on file    ROS Review of Systems  Objective:   Today's Vitals: BP 120/84   Pulse 75   Temp (!) 97.5 F (36.4 C)   Wt 234 lb (106.1 kg)   SpO2 100%   BMI 37.77 kg/m   Physical Exam Vitals and nursing note reviewed.  Constitutional:      General: She is not in acute distress.    Appearance: Normal appearance. She is obese. She is not ill-appearing, toxic-appearing or diaphoretic.  HENT:     Mouth/Throat:     Mouth: Mucous membranes are moist.     Pharynx: Oropharynx is clear. No oropharyngeal exudate or posterior oropharyngeal erythema.  Eyes:     General: No scleral icterus.       Right eye: No discharge.        Left  eye: No discharge.     Extraocular Movements: Extraocular movements intact.     Conjunctiva/sclera: Conjunctivae normal.  Cardiovascular:     Rate and Rhythm: Normal rate and regular rhythm.     Pulses: Normal pulses.     Heart sounds: Normal heart sounds. No murmur heard.    No friction rub. No gallop.  Pulmonary:     Effort: Pulmonary effort is normal. No respiratory distress.     Breath sounds: Normal breath sounds. No stridor. No wheezing, rhonchi or rales.  Chest:     Chest wall: No tenderness.  Abdominal:     General: There is no distension.     Palpations: Abdomen is soft.     Tenderness: There is no abdominal tenderness. There is no right CVA tenderness, left CVA tenderness or guarding.  Musculoskeletal:        General: Tenderness present. No swelling, deformity or  signs of injury.     Right lower leg: No edema.     Left lower leg: No edema.     Comments: Tenderness on range of motion of right knee, tenderness on palpation of mid low back area, no redness or swelling noted.  Skin:    General: Skin is warm and dry.     Capillary Refill: Capillary refill takes less than 2 seconds.     Coloration: Skin is not jaundiced or pale.     Findings: No bruising, erythema or lesion.  Neurological:     Mental Status: She is alert and oriented to person, place, and time.     Motor: No weakness.     Coordination: Coordination normal.     Gait: Gait normal.  Psychiatric:        Mood and Affect: Mood normal.        Behavior: Behavior normal.        Thought Content: Thought content normal.        Judgment: Judgment normal.     Assessment & Plan:   Problem List Items Addressed This Visit       Endocrine   Type 2 diabetes mellitus without complication, without long-term current use of insulin  (HCC) - Primary   Lab Results  Component Value Date   HGBA1C 8.8 (A) 12/24/2023  Chronic medical condition currently uncontrolled Continue metformin  1000 mg twice daily Start Mounjaro  2.5 mg once weekly Patient counseled on low-carb diet, CBG goals discussed, she was referred for diabetes nutrition education Checking urine microalbumin labs, referral for diabetic eye exam placed Follow-up in 4 weeks      Relevant Medications   tirzepatide  (MOUNJARO ) 2.5 MG/0.5ML Pen   metFORMIN  (GLUCOPHAGE ) 1000 MG tablet   Other Relevant Orders   POCT glycosylated hemoglobin (Hb A1C) (Completed)   Microalbumin/Creatinine Ratio, Urine   Ambulatory referral to Ophthalmology   Ambulatory referral to diabetic education   Lipid panel   CMP14+EGFR   Diabetes mellitus (HCC)   Relevant Medications   tirzepatide  (MOUNJARO ) 2.5 MG/0.5ML Pen   metFORMIN  (GLUCOPHAGE ) 1000 MG tablet   Other Relevant Orders   POCT glycosylated hemoglobin (Hb A1C) (Completed)    Microalbumin/Creatinine Ratio, Urine   Ambulatory referral to Ophthalmology   Ambulatory referral to diabetic education   Lipid panel   CMP14+EGFR     Other   Low back pain    - methylPREDNISolone  sodium succinate (SOLU-MEDROL ) 125 mg/2 mL injection 125 mg - ketorolac  (TORADOL ) 30 MG/ML injection 30 mg Take ibuprofen  600 mg every 8 hours as needed, take medication with food  and alternate with Tylenol  650 mg every 6 hours as needed Stretching exercises application of heating pad encouraged Patient referred to orthopedics   Narrative & Impression  CLINICAL DATA:  Right sciatica pain traveling to the right leg.   EXAM: LUMBAR SPINE - 2-3 VIEW   COMPARISON:  None Available.   FINDINGS: There is no evidence of lumbar spine fracture. Mild curvature of spine. Intervertebral disc spaces are maintained.   IMPRESSION: No acute fracture or dislocation. Mild curvature of spine. No significant degenerative joint changes           Relevant Medications   ibuprofen  (ADVIL ) 600 MG tablet   Other Relevant Orders   Ambulatory referral to Orthopedic Surgery   Obesity with serious comorbidity   Wt Readings from Last 3 Encounters:  12/24/23 234 lb (106.1 kg)  06/01/23 229 lb 14.4 oz (104.3 kg)  08/01/21 228 lb 4.8 oz (103.6 kg)   Body mass index is 37.77 kg/m.  Patient counseled on low-carb diet Encouraged to engage in regular moderate to vigorous exercise at least 150 minutes weekly as tolerated Starting Mounjaro  for type 2 diabetes      Relevant Medications   tirzepatide  (MOUNJARO ) 2.5 MG/0.5ML Pen   metFORMIN  (GLUCOPHAGE ) 1000 MG tablet   Iron  deficiency anemia   Lab Results  Component Value Date   WBC 4.8 09/06/2023   HGB 12.4 09/06/2023   HCT 38.5 09/06/2023   MCV 76.1 (L) 09/06/2023   PLT 243 09/06/2023  Followed by hematologist, she is past due for follow-up, patient encouraged to schedule an appointment for follow-up       Relevant Orders   CBC   Chronic pain of  right knee    Knee x-ray ordered - methylPREDNISolone  sodium succinate (SOLU-MEDROL ) 125 mg/2 mL injection 125 mg - ketorolac  (TORADOL ) 30 MG/ML injection 30 mg Take ibuprofen  600 mg every 8 hours as needed, take medication with food and alternate with Tylenol  650 mg every 6 hours as needed Stretching exercises application of heating pad encouraged Patient referred to orthopedics       Relevant Medications   ibuprofen  (ADVIL ) 600 MG tablet   Other Relevant Orders   Ambulatory referral to Orthopedic Surgery   DG Knee Complete 4 Views Right   Dyslipidemia, goal LDL below 70   On atorvastatin  40 mg daily LDL goal is less than 70 Checking lipid panel Lab Results  Component Value Date   CHOL 174 12/11/2020   HDL 49 12/11/2020   LDLCALC 96 12/11/2020   TRIG 166 (H) 12/11/2020   CHOLHDL 3.6 12/11/2020          Outpatient Encounter Medications as of 12/24/2023  Medication Sig   acetaminophen  (TYLENOL ) 500 MG tablet Take 2 tablets (1,000 mg total) by mouth every 6 (six) hours as needed.   atorvastatin  (LIPITOR) 40 MG tablet Take 1 tablet (40 mg total) by mouth daily.   glucose blood (ONETOUCH VERIO) test strip Use to check blood sugar 3 times daily. E11.69   tirzepatide  (MOUNJARO ) 2.5 MG/0.5ML Pen Inject 2.5 mg into the skin once a week.   [DISCONTINUED] ibuprofen  (ADVIL ) 600 MG tablet Take 1 tablet (600 mg total) by mouth every 8 (eight) hours as needed. (Patient taking differently: Take 600 mg by mouth every 8 (eight) hours as needed. PRN)   [DISCONTINUED] metFORMIN  (GLUCOPHAGE ) 1000 MG tablet TAKE 1 TABLET (1,000 MG TOTAL) BY MOUTH 2 (TWO) TIMES DAILY WITH A MEAL.   ibuprofen  (ADVIL ) 600 MG tablet Take 1 tablet (600 mg  total) by mouth every 8 (eight) hours as needed.   Insulin  Pen Needle (B-D UF III MINI PEN NEEDLES) 31G X 5 MM MISC Use as instructed. Inject into the skin once nightly. (Patient not taking: Reported on 12/24/2023)   metFORMIN  (GLUCOPHAGE ) 1000 MG tablet Take 1 tablet  (1,000 mg total) by mouth 2 (two) times daily with a meal.   [DISCONTINUED] glipiZIDE  (GLUCOTROL ) 5 MG tablet TAKE 0.5 TABLETS (2.5 MG TOTAL) BY MOUTH 2 (TWO) TIMES DAILY BEFORE A MEAL. (Patient not taking: Reported on 12/24/2023)   [DISCONTINUED] TRULICITY 1.5 MG/0.5ML SOAJ Inject into the skin once a week. (Patient not taking: Reported on 12/24/2023)   [EXPIRED] ketorolac  (TORADOL ) 30 MG/ML injection 30 mg    [EXPIRED] methylPREDNISolone  sodium succinate (SOLU-MEDROL ) 125 mg/2 mL injection 125 mg    No facility-administered encounter medications on file as of 12/24/2023.    Follow-up: Return in about 4 weeks (around 01/21/2024) for DM.   Ronell Boldin R Charika Mikelson, FNP

## 2023-12-24 NOTE — Assessment & Plan Note (Signed)
 Wt Readings from Last 3 Encounters:  12/24/23 234 lb (106.1 kg)  06/01/23 229 lb 14.4 oz (104.3 kg)  08/01/21 228 lb 4.8 oz (103.6 kg)   Body mass index is 37.77 kg/m.  Patient counseled on low-carb diet Encouraged to engage in regular moderate to vigorous exercise at least 150 minutes weekly as tolerated Starting Mounjaro  for type 2 diabetes

## 2023-12-24 NOTE — Patient Instructions (Addendum)
 You were given toradol  30 mg injection Solu-Medrol  40 mg injection in the office today Please take ibuprofen  600 mg 3 times daily as needed alternate with Tylenol  650 mg every 6 hours as needed.  Also encouraged the use of heating pad and stretching exercises.  I have referred you to orthopedics   Goal for fasting blood sugar ranges from 80 to 120 and 2 hours after any meal or at bedtime should be between 130 to 170.    1. Type 2 diabetes mellitus without complication, without long-term current use of insulin  (HCC) (Primary)  - POCT glycosylated hemoglobin (Hb A1C) - Microalbumin/Creatinine Ratio, Urine - Ambulatory referral to Ophthalmology - Ambulatory referral to diabetic education - tirzepatide  (MOUNJARO ) 2.5 MG/0.5ML Pen; Inject 2.5 mg into the skin once a week.  Dispense: 2 mL; Refill: 0 - CMP14+EGFR - Direct LDL   . Midline low back pain with right-sided sciatica, unspecified chronicity  - ibuprofen  (ADVIL ) 600 MG tablet; Take 1 tablet (600 mg total) by mouth every 8 (eight) hours as needed.  Dispense: 60 tablet; Refill: 1 - Ambulatory referral to Orthopedic Surgery  . Chronic pain of right knee  - ibuprofen  (ADVIL ) 600 MG tablet; Take 1 tablet (600 mg total) by mouth every 8 (eight) hours as needed.  Dispense: 60 tablet; Refill: 1 - Ambulatory referral to Orthopedic Surgery - DG Knee Complete 4 Views Right; Future

## 2023-12-24 NOTE — Assessment & Plan Note (Signed)
 Knee x-ray ordered - methylPREDNISolone  sodium succinate (SOLU-MEDROL ) 125 mg/2 mL injection 125 mg - ketorolac  (TORADOL ) 30 MG/ML injection 30 mg Take ibuprofen  600 mg every 8 hours as needed, take medication with food and alternate with Tylenol  650 mg every 6 hours as needed Stretching exercises application of heating pad encouraged Patient referred to orthopedics

## 2023-12-24 NOTE — Assessment & Plan Note (Signed)
 Lab Results  Component Value Date   WBC 4.8 09/06/2023   HGB 12.4 09/06/2023   HCT 38.5 09/06/2023   MCV 76.1 (L) 09/06/2023   PLT 243 09/06/2023  Followed by hematologist, she is past due for follow-up, patient encouraged to schedule an appointment for follow-up

## 2023-12-24 NOTE — Addendum Note (Signed)
 Addended by: Jacquetta Mattocks on: 12/24/2023 01:25 PM   Modules accepted: Orders

## 2023-12-24 NOTE — Assessment & Plan Note (Addendum)
-   methylPREDNISolone  sodium succinate (SOLU-MEDROL ) 125 mg/2 mL injection 125 mg - ketorolac  (TORADOL ) 30 MG/ML injection 30 mg Take ibuprofen  600 mg every 8 hours as needed, take medication with food and alternate with Tylenol  650 mg every 6 hours as needed Stretching exercises application of heating pad encouraged Patient referred to orthopedics   Narrative & Impression  CLINICAL DATA:  Right sciatica pain traveling to the right leg.   EXAM: LUMBAR SPINE - 2-3 VIEW   COMPARISON:  None Available.   FINDINGS: There is no evidence of lumbar spine fracture. Mild curvature of spine. Intervertebral disc spaces are maintained.   IMPRESSION: No acute fracture or dislocation. Mild curvature of spine. No significant degenerative joint changes

## 2023-12-24 NOTE — Assessment & Plan Note (Signed)
 On atorvastatin  40 mg daily LDL goal is less than 70 Checking lipid panel Lab Results  Component Value Date   CHOL 174 12/11/2020   HDL 49 12/11/2020   LDLCALC 96 12/11/2020   TRIG 166 (H) 12/11/2020   CHOLHDL 3.6 12/11/2020

## 2023-12-25 LAB — CBC
Hematocrit: 39.2 % (ref 34.0–46.6)
Hemoglobin: 12.3 g/dL (ref 11.1–15.9)
MCH: 25.1 pg — ABNORMAL LOW (ref 26.6–33.0)
MCHC: 31.4 g/dL — ABNORMAL LOW (ref 31.5–35.7)
MCV: 80 fL (ref 79–97)
Platelets: 239 10*3/uL (ref 150–450)
RBC: 4.9 x10E6/uL (ref 3.77–5.28)
RDW: 13.7 % (ref 11.7–15.4)
WBC: 5.6 10*3/uL (ref 3.4–10.8)

## 2023-12-25 LAB — CMP14+EGFR
ALT: 25 IU/L (ref 0–32)
AST: 19 IU/L (ref 0–40)
Albumin: 4.5 g/dL (ref 3.9–4.9)
Alkaline Phosphatase: 71 IU/L (ref 44–121)
BUN/Creatinine Ratio: 17 (ref 9–23)
BUN: 11 mg/dL (ref 6–24)
Bilirubin Total: 0.3 mg/dL (ref 0.0–1.2)
CO2: 21 mmol/L (ref 20–29)
Calcium: 9.4 mg/dL (ref 8.7–10.2)
Chloride: 98 mmol/L (ref 96–106)
Creatinine, Ser: 0.65 mg/dL (ref 0.57–1.00)
Globulin, Total: 2.7 g/dL (ref 1.5–4.5)
Glucose: 266 mg/dL — ABNORMAL HIGH (ref 70–99)
Potassium: 4.6 mmol/L (ref 3.5–5.2)
Sodium: 134 mmol/L (ref 134–144)
Total Protein: 7.2 g/dL (ref 6.0–8.5)
eGFR: 113 mL/min/{1.73_m2} (ref >59)

## 2023-12-25 LAB — LDL CHOLESTEROL, DIRECT: LDL Direct: 95 mg/dL (ref 0–99)

## 2023-12-26 LAB — MICROALBUMIN / CREATININE URINE RATIO
Creatinine, Urine: 151 mg/dL
Microalb/Creat Ratio: 8 mg/g{creat} (ref 0–29)
Microalbumin, Urine: 11.9 ug/mL

## 2023-12-27 ENCOUNTER — Other Ambulatory Visit: Payer: Self-pay | Admitting: Nurse Practitioner

## 2023-12-27 ENCOUNTER — Other Ambulatory Visit: Payer: Self-pay

## 2023-12-27 DIAGNOSIS — E785 Hyperlipidemia, unspecified: Secondary | ICD-10-CM

## 2023-12-27 MED ORDER — ATORVASTATIN CALCIUM 80 MG PO TABS
80.0000 mg | ORAL_TABLET | Freq: Every day | ORAL | 3 refills | Status: AC
  Filled 2023-12-27: qty 90, 90d supply, fill #0
  Filled 2024-04-10: qty 90, 90d supply, fill #1

## 2023-12-28 ENCOUNTER — Other Ambulatory Visit: Payer: Self-pay

## 2023-12-29 ENCOUNTER — Other Ambulatory Visit: Payer: Self-pay

## 2024-01-03 ENCOUNTER — Other Ambulatory Visit: Payer: Self-pay

## 2024-01-04 ENCOUNTER — Other Ambulatory Visit

## 2024-01-04 DIAGNOSIS — Z111 Encounter for screening for respiratory tuberculosis: Secondary | ICD-10-CM

## 2024-01-07 LAB — QUANTIFERON-TB GOLD PLUS
QuantiFERON Mitogen Value: >10 [IU]/mL
QuantiFERON Nil Value: 0.05 [IU]/mL
QuantiFERON TB1 Ag Value: 0.07 [IU]/mL
QuantiFERON TB2 Ag Value: 0.07 [IU]/mL
QuantiFERON-TB Gold Plus: NEGATIVE

## 2024-01-28 ENCOUNTER — Other Ambulatory Visit: Payer: Self-pay

## 2024-01-28 ENCOUNTER — Encounter: Payer: Self-pay | Admitting: Nurse Practitioner

## 2024-01-28 ENCOUNTER — Ambulatory Visit (INDEPENDENT_AMBULATORY_CARE_PROVIDER_SITE_OTHER): Payer: Self-pay | Admitting: Nurse Practitioner

## 2024-01-28 VITALS — BP 118/79 | HR 85 | Temp 97.2°F | Wt 234.0 lb

## 2024-01-28 DIAGNOSIS — E119 Type 2 diabetes mellitus without complications: Secondary | ICD-10-CM | POA: Diagnosis not present

## 2024-01-28 DIAGNOSIS — M25561 Pain in right knee: Secondary | ICD-10-CM

## 2024-01-28 DIAGNOSIS — E66811 Obesity, class 1: Secondary | ICD-10-CM

## 2024-01-28 DIAGNOSIS — G8929 Other chronic pain: Secondary | ICD-10-CM

## 2024-01-28 DIAGNOSIS — Z6833 Body mass index (BMI) 33.0-33.9, adult: Secondary | ICD-10-CM

## 2024-01-28 DIAGNOSIS — E785 Hyperlipidemia, unspecified: Secondary | ICD-10-CM | POA: Diagnosis not present

## 2024-01-28 DIAGNOSIS — Z152 Genetic susceptibility to obesity: Secondary | ICD-10-CM

## 2024-01-28 DIAGNOSIS — E8882 Obesity due to disruption of MC4R pathway: Secondary | ICD-10-CM

## 2024-01-28 MED ORDER — TIRZEPATIDE 5 MG/0.5ML ~~LOC~~ SOAJ
5.0000 mg | SUBCUTANEOUS | 3 refills | Status: DC
  Filled 2024-01-28: qty 2, 28d supply, fill #0
  Filled 2024-03-06: qty 2, 28d supply, fill #1
  Filled 2024-04-10: qty 2, 28d supply, fill #2

## 2024-01-28 NOTE — Patient Instructions (Addendum)
 [name], Lisa  [address] [place] [address]  P:  [phone] F:  [phone]   [facility] [address] [place]  [address] [place] [address]  P:  [phone] F:  [phone]   Diabetes nutrition education.  [phone]  Goal for fasting blood sugar ranges from 80 to 120 and 2 hours after any meal or at bedtime should be between 130 to 170.   1. Type 2 diabetes mellitus without complication, without long-term current use of insulin  (HCC) (Primary)  - tirzepatide  (MOUNJARO ) 5 MG/0.5ML Pen; Inject 5 mg into the skin once a week.  Dispense: 6 mL; Refill: 3 - Direct LDL It is important that you exercise regularly at least 30 minutes 5 times a week as tolerated  Think about what you will eat, plan ahead. Choose " clean, green, fresh or frozen" over canned, processed or packaged foods which are more sugary, salty and fatty. 70 to 75% of food eaten should be vegetables and fruit. Three meals at set times with snacks allowed between meals, but they must be fruit or vegetables. Aim to eat over a 12 hour period , example 7 am to 7 pm, and STOP after  your last meal of the day. Drink water,generally about 64 ounces per day, no other drink is as healthy. Fruit juice is best enjoyed in a healthy way, by EATING the fruit.  Thanks for choosing [facility] we consider it a privelige to serve you.

## 2024-01-28 NOTE — Assessment & Plan Note (Signed)
 Lab Results  Component Value Date   HGBA1C 8.8 (A) 12/24/2023  Continue metformin  1000 mg twice daily Start Mounjaro  5 mg once weekly injection CBG goals discussed Patient counseled on low-carb diet, encouraged to get her diabetes eye exam completed, call the nutritionist to schedule diabetes nutrition education  Follow-up in 2 months

## 2024-01-28 NOTE — Addendum Note (Signed)
 Addended by: Jacquetta Mattocks on: 01/28/2024 06:17 PM   Modules accepted: Level of Service

## 2024-01-28 NOTE — Assessment & Plan Note (Signed)
 Lab Results  Component Value Date   CHOL 174 12/11/2020   HDL 49 12/11/2020   LDLCALC 96 12/11/2020   LDLDIRECT 95 12/24/2023   TRIG 166 (H) 12/11/2020   CHOLHDL 3.6 12/11/2020  Continue atorvastatin  80 mg daily Avoid fatty fried foods, lose weight

## 2024-01-28 NOTE — Assessment & Plan Note (Signed)
 Encouraged to follow-up with orthopedics as planned, their phone number provided Continue ibuprofen  600 mg every 8 hours as needed

## 2024-01-28 NOTE — Progress Notes (Signed)
 Established Patient Office Visit  Subjective:  Patient ID: Lisa [name], female    DOB: November 18, 1980  Age: 43 y.o. MRN: [id number]  CC:  Chief Complaint  Patient presents with   Diabetes    HPI Lisa [name] is a 43 y.o. female  has a past medical history of COVID-19, Diabetes mellitus (HCC), Iron  deficiency anemia (06/01/2023), and Morbid obesity (HCC).  Patient presents for follow-up for her chronic medical conditions. she  denies any adverse reactions to her current medications   Type 2 diabetes.  Currently on metformin  1000 mg twice daily, Mounjaro  2.5 mg once weekly, takes atorvastatin  80 mg daily for hyperlipidemia.  States that her random blood sugar readings have been between 130s to 140s, sometimes in the 200s. patient denies polyuria polyphagia polydipsia   Past Medical History:  Diagnosis Date   COVID-19    Diabetes mellitus (HCC)    Iron  deficiency anemia 06/01/2023   Morbid obesity (HCC)     History reviewed. No pertinent surgical history.  Family History  Problem Relation Age of Onset   Diabetes type II Mother    Diabetes Mellitus II Father    Diabetes Maternal Grandmother    Diabetes Maternal Grandfather    Other Neg Hx     Social History   Socioeconomic History   Marital status: Married    Spouse name: Not on file   Number of children: 2   Years of education: Not on file   Highest education level: Not on file  Occupational History   Not on file  Tobacco Use   Smoking status: Never   Smokeless tobacco: Never  Vaping Use   Vaping status: Never Used  Substance and Sexual Activity   Alcohol use: No   Drug use: Never   Sexual activity: Not Currently  Other Topics Concern   Not on file  Social History Narrative   Lives with her husband. He husband lives in [place]    Social Drivers of Health   Financial Resource Strain: Not on file  Food Insecurity: Not on file  Transportation Needs: Not on file  Physical Activity: Not on file   Stress: Not on file  Social Connections: Not on file  Intimate Partner Violence: Not on file    Outpatient Medications Prior to Visit  Medication Sig Dispense Refill   acetaminophen  (TYLENOL ) 500 MG tablet Take 2 tablets (1,000 mg total) by mouth every 6 (six) hours as needed. 30 tablet 0   atorvastatin  (LIPITOR) 80 MG tablet Take 1 tablet (80 mg total) by mouth daily. 90 tablet 3   glucose blood (ONETOUCH VERIO) test strip Use to check blood sugar 3 times daily. E11.69 100 each 6   ibuprofen  (ADVIL ) 600 MG tablet Take 1 tablet (600 mg total) by mouth every 8 (eight) hours as needed. 60 tablet 1   metFORMIN  (GLUCOPHAGE ) 1000 MG tablet Take 1 tablet (1,000 mg total) by mouth 2 (two) times daily with a meal. 180 tablet 2   tirzepatide  (MOUNJARO ) 2.5 MG/0.5ML Pen Inject 2.5 mg into the skin once a week. 2 mL 0   Insulin  Pen Needle (B-D UF III MINI PEN NEEDLES) 31G X 5 MM MISC Use as instructed. Inject into the skin once nightly. (Patient not taking: Reported on 01/28/2024) 100 each 6   No facility-administered medications prior to visit.    Allergies  Allergen Reactions   Jardiance  [Empagliflozin ] Other (See Comments)    HEADACHE/DIZZINESS    ROS Review of Systems  Constitutional:  Negative for appetite change, chills, fatigue and fever.  HENT:  Negative for congestion, postnasal drip, rhinorrhea and sneezing.   Respiratory:  Negative for cough, shortness of breath and wheezing.   Cardiovascular:  Negative for chest pain, palpitations and leg swelling.  Gastrointestinal:  Negative for abdominal pain, constipation, nausea and vomiting.  Genitourinary:  Negative for difficulty urinating, dysuria, flank pain and frequency.  Musculoskeletal:  Negative for arthralgias, back pain, joint swelling and myalgias.  Skin:  Negative for color change, pallor, rash and wound.  Neurological:  Negative for dizziness, facial asymmetry, weakness, numbness and headaches.  Psychiatric/Behavioral:   Negative for behavioral problems, confusion, self-injury and suicidal ideas.       Objective:     Physical Exam Vitals and nursing note reviewed.  Constitutional:      General: She is not in acute distress.    Appearance: Normal appearance. She is not ill-appearing, toxic-appearing or diaphoretic.  Eyes:     General: No scleral icterus.       Right eye: No discharge.        Left eye: No discharge.     Extraocular Movements: Extraocular movements intact.     Conjunctiva/sclera: Conjunctivae normal.  Cardiovascular:     Rate and Rhythm: Normal rate and regular rhythm.     Pulses: Normal pulses.     Heart sounds: Normal heart sounds. No murmur heard.    No friction rub. No gallop.  Pulmonary:     Effort: Pulmonary effort is normal. No respiratory distress.     Breath sounds: Normal breath sounds. No stridor. No wheezing, rhonchi or rales.  Chest:     Chest wall: No tenderness.  Abdominal:     General: There is no distension.     Palpations: Abdomen is soft.     Tenderness: There is no abdominal tenderness. There is no right CVA tenderness, left CVA tenderness or guarding.  Musculoskeletal:        General: No deformity or signs of injury.     Right lower leg: No edema.     Left lower leg: No edema.  Skin:    General: Skin is warm and dry.     Capillary Refill: Capillary refill takes less than 2 seconds.     Coloration: Skin is not jaundiced or pale.     Findings: No bruising, erythema or lesion.  Neurological:     Mental Status: She is alert and oriented to person, place, and time.     Motor: No weakness.     Coordination: Coordination normal.     Gait: Gait normal.  Psychiatric:        Mood and Affect: Mood normal.        Behavior: Behavior normal.        Thought Content: Thought content normal.        Judgment: Judgment normal.     BP 118/79   Pulse 85   Temp (!) 97.2 F (36.2 C)   Wt 234 lb (106.1 kg)   SpO2 96%   BMI 37.77 kg/m  Wt Readings from Last 3  Encounters:  01/28/24 234 lb (106.1 kg)  12/24/23 234 lb (106.1 kg)  06/01/23 229 lb 14.4 oz (104.3 kg)    Lab Results  Component Value Date   TSH 3.570 12/11/2020   Lab Results  Component Value Date   WBC 5.6 12/24/2023   HGB 12.3 12/24/2023   HCT 39.2 12/24/2023   MCV 80 12/24/2023   PLT 239 12/24/2023  Lab Results  Component Value Date   NA 134 12/24/2023   K 4.6 12/24/2023   CO2 21 12/24/2023   GLUCOSE 266 (H) 12/24/2023   BUN 11 12/24/2023   CREATININE 0.65 12/24/2023   BILITOT 0.3 12/24/2023   ALKPHOS 71 12/24/2023   AST 19 12/24/2023   ALT 25 12/24/2023   PROT 7.2 12/24/2023   ALBUMIN 4.5 12/24/2023   CALCIUM  9.4 12/24/2023   ANIONGAP 11 01/30/2018   EGFR 113 12/24/2023   Lab Results  Component Value Date   CHOL 174 12/11/2020   Lab Results  Component Value Date   HDL 49 12/11/2020   Lab Results  Component Value Date   LDLCALC 96 12/11/2020   Lab Results  Component Value Date   TRIG 166 (H) 12/11/2020   Lab Results  Component Value Date   CHOLHDL 3.6 12/11/2020   Lab Results  Component Value Date   HGBA1C 8.8 (A) 12/24/2023      Assessment & Plan:   Problem List Items Addressed This Visit       Endocrine   Type 2 diabetes mellitus without complication, without long-term current use of insulin  (HCC) - Primary   Lab Results  Component Value Date   HGBA1C 8.8 (A) 12/24/2023  Continue metformin  1000 mg twice daily Start Mounjaro  5 mg once weekly injection CBG goals discussed Patient counseled on low-carb diet, encouraged to get her diabetes eye exam completed, call the nutritionist to schedule diabetes nutrition education  Follow-up in 2 months      Relevant Medications   tirzepatide  (MOUNJARO ) 5 MG/0.5ML Pen   Other Relevant Orders   Direct LDL     Other   Obesity with serious comorbidity   Wt Readings from Last 3 Encounters:  01/28/24 234 lb (106.1 kg)  12/24/23 234 lb (106.1 kg)  06/01/23 229 lb 14.4 oz (104.3 kg)   Patient counseled on low-carb diet Encouraged to engage in regular moderate exercises at least 150 minutes weekly as tolerated Benefits of healthy weights discussed Continue Mounjaro  for type 2 diabetes      Relevant Medications   tirzepatide  (MOUNJARO ) 5 MG/0.5ML Pen   Chronic pain of right knee   Encouraged to follow-up with orthopedics as planned, their phone number provided Continue ibuprofen  600 mg every 8 hours as needed      Dyslipidemia, goal LDL below 70   Lab Results  Component Value Date   CHOL 174 12/11/2020   HDL 49 12/11/2020   LDLCALC 96 12/11/2020   LDLDIRECT 95 12/24/2023   TRIG 166 (H) 12/11/2020   CHOLHDL 3.6 12/11/2020  Continue atorvastatin  80 mg daily Avoid fatty fried foods, lose weight      Relevant Orders   Direct LDL    Meds ordered this encounter  Medications   tirzepatide  (MOUNJARO ) 5 MG/0.5ML Pen    Sig: Inject 5 mg into the skin once a week.    Dispense:  6 mL    Refill:  3    Follow-up: Return in about 2 months (around 03/29/2024) for DM.    Christia Coaxum R Yuna Pizzolato, FNP

## 2024-01-28 NOTE — Assessment & Plan Note (Addendum)
 Wt Readings from Last 3 Encounters:  01/28/24 234 lb (106.1 kg)  12/24/23 234 lb (106.1 kg)  06/01/23 229 lb 14.4 oz (104.3 kg)  Patient counseled on low-carb diet Encouraged to engage in regular moderate exercises at least 150 minutes weekly as tolerated Benefits of healthy weights discussed Continue Mounjaro  for type 2 diabetes

## 2024-01-29 LAB — LDL CHOLESTEROL, DIRECT: LDL Direct: 51 mg/dL (ref 0–99)

## 2024-01-31 ENCOUNTER — Ambulatory Visit: Payer: Self-pay | Admitting: Nurse Practitioner

## 2024-02-02 NOTE — Progress Notes (Deleted)
 Arabic interpreter id:   Start: *** end: *** Patient is here today *** Patient would like to learn *** Patient lives with ***.  *** shopping and cooking.  History includes:  *** Medications include:  *** Labs noted:  ***  8.8%, met, mounjaro 

## 2024-02-07 ENCOUNTER — Other Ambulatory Visit: Payer: Self-pay

## 2024-02-09 ENCOUNTER — Ambulatory Visit: Admitting: Dietician

## 2024-02-09 DIAGNOSIS — E119 Type 2 diabetes mellitus without complications: Secondary | ICD-10-CM

## 2024-03-06 ENCOUNTER — Other Ambulatory Visit: Payer: Self-pay

## 2024-03-28 ENCOUNTER — Encounter: Payer: Self-pay | Admitting: Hematology and Oncology

## 2024-03-29 ENCOUNTER — Ambulatory Visit: Payer: Self-pay | Admitting: Nurse Practitioner

## 2024-04-10 ENCOUNTER — Other Ambulatory Visit: Payer: Self-pay

## 2024-05-08 ENCOUNTER — Ambulatory Visit (INDEPENDENT_AMBULATORY_CARE_PROVIDER_SITE_OTHER): Payer: Self-pay | Admitting: Nurse Practitioner

## 2024-05-08 ENCOUNTER — Other Ambulatory Visit: Payer: Self-pay

## 2024-05-08 ENCOUNTER — Encounter: Payer: Self-pay | Admitting: Nurse Practitioner

## 2024-05-08 VITALS — BP 124/82 | HR 85 | Temp 97.3°F | Wt 235.0 lb

## 2024-05-08 DIAGNOSIS — E119 Type 2 diabetes mellitus without complications: Secondary | ICD-10-CM | POA: Diagnosis not present

## 2024-05-08 DIAGNOSIS — D509 Iron deficiency anemia, unspecified: Secondary | ICD-10-CM | POA: Diagnosis not present

## 2024-05-08 DIAGNOSIS — Z6837 Body mass index (BMI) 37.0-37.9, adult: Secondary | ICD-10-CM

## 2024-05-08 DIAGNOSIS — G8929 Other chronic pain: Secondary | ICD-10-CM

## 2024-05-08 DIAGNOSIS — M25561 Pain in right knee: Secondary | ICD-10-CM

## 2024-05-08 DIAGNOSIS — M79672 Pain in left foot: Secondary | ICD-10-CM | POA: Diagnosis not present

## 2024-05-08 DIAGNOSIS — M5442 Lumbago with sciatica, left side: Secondary | ICD-10-CM | POA: Diagnosis not present

## 2024-05-08 DIAGNOSIS — E785 Hyperlipidemia, unspecified: Secondary | ICD-10-CM

## 2024-05-08 DIAGNOSIS — R5383 Other fatigue: Secondary | ICD-10-CM | POA: Insufficient documentation

## 2024-05-08 DIAGNOSIS — E559 Vitamin D deficiency, unspecified: Secondary | ICD-10-CM

## 2024-05-08 DIAGNOSIS — E66812 Obesity, class 2: Secondary | ICD-10-CM

## 2024-05-08 DIAGNOSIS — H538 Other visual disturbances: Secondary | ICD-10-CM | POA: Insufficient documentation

## 2024-05-08 LAB — POCT GLYCOSYLATED HEMOGLOBIN (HGB A1C): Hemoglobin A1C: 7.6 % — AB (ref 4.0–5.6)

## 2024-05-08 MED ORDER — LIDOCAINE 5 % EX PTCH
1.0000 | MEDICATED_PATCH | CUTANEOUS | 0 refills | Status: AC
  Filled 2024-05-08: qty 30, 30d supply, fill #0

## 2024-05-08 MED ORDER — TIRZEPATIDE 7.5 MG/0.5ML ~~LOC~~ SOAJ
7.5000 mg | SUBCUTANEOUS | 1 refills | Status: DC
  Filled 2024-05-08: qty 2, 28d supply, fill #0

## 2024-05-08 MED ORDER — KETOROLAC TROMETHAMINE 30 MG/ML IJ SOLN
30.0000 mg | Freq: Once | INTRAMUSCULAR | Status: AC
  Administered 2024-05-08: 30 mg via INTRAMUSCULAR

## 2024-05-08 NOTE — Assessment & Plan Note (Signed)
 Advised to follow up with ophthalmology.

## 2024-05-08 NOTE — Assessment & Plan Note (Signed)
 Left heel pain Pain onset two weeks ago, does a lot of standing at work ,Temporary relief with ibuprofen  and topical ice cream. - Evaluate further if symptoms persist after addressing right knee pain. Toradol  30mg  injection given today , advised not to take ibuprofen  again today Use of shoes with soft sole advised

## 2024-05-08 NOTE — Assessment & Plan Note (Addendum)
 Lab Results  Component Value Date   HGBA1C 7.6 (A) 05/08/2024   Improved glycemic control with A1c reduced from 8.8% to 7.6%. Goal A1c <7%. - Continue metformin  1000 mg orally twice daily with meals. - tirzepatide  (Mounjaro ) to 7.5mg  subcutaneously once a week. - Advise to reduce intake of bread, rice, and pasta. - Encourage regular physical activity, especially on days off work. CBG goals discussed Advise to schedule an eye exam and contact insurance for providers accepting her plan. Diabetic foot exam completed

## 2024-05-08 NOTE — Progress Notes (Signed)
 Established Patient Office Visit  Subjective:  Patient ID: Lisa [name], female    DOB: [birth year]  Age: 43 y.o. MRN: [id number]  CC:  Chief Complaint  Patient presents with   Diabetes   Leg Pain    Right and left foot     HPI      Discussed the use of AI scribe software for clinical note transcription with the patient, who gave verbal consent to proceed.  History of Present Illness Lisa [name] is a 43 year old female  has a past medical history of COVID-19, Diabetes mellitus (HCC), Iron  deficiency anemia (06/01/2023), and Morbid obesity (HCC).  with diabetes who presents for follow-up of her blood sugar management and leg pain. She declined the use of a language interpreter today   Her  A1c decreasing from 8.8% four months ago to 7.6% currently. She is taking metformin  1000 mg twice daily and Mounjaro  5 mg weekly. Despite these improvements, she finds it challenging to adhere to dietary recommendations, consuming bread daily and occasionally drinking diet soda. She feels tired every day despite sleeping eight hours and experiences blurry vision upon waking, which resolves after a few minutes. She has not seen an eye doctor in the past year due to insurance issues.  She reports persistent pain in her right knee, also reported left heel, which began two weeks ago. The pain in her right knee radiates from her hip down to her knee, and she rates it as 9 out of 10 in severity, sometimes reaching 10. She uses ibuprofen  600 mg every eight hours as needed, Tylenol , and a topical cream called 'Ice' for pain relief, which provides temporary relief. She has not taken ibuprofen  today   Her current medications include atorvastatin  80 mg daily for cholesterol management. She has not lost weight since starting Mounjaro ,,pounds since May. She engages in physical activity primarily through her work, but does not have a regular exercise routine outside of work.  She mentions a family  history of conflict in [place], where her family is currently affected by war.   Assessment & Plan    Past Medical History:  Diagnosis Date   COVID-19    Diabetes mellitus (HCC)    Iron  deficiency anemia 06/01/2023   Morbid obesity (HCC)     History reviewed. No pertinent surgical history.  Family History  Problem Relation Age of Onset   Diabetes type II Mother    Diabetes Mellitus II Father    Diabetes Maternal Grandmother    Diabetes Maternal Grandfather    Other Neg Hx     Social History   Socioeconomic History   Marital status: Married    Spouse name: Not on file   Number of children: 2   Years of education: Not on file   Highest education level: Not on file  Occupational History   Not on file  Tobacco Use   Smoking status: Never   Smokeless tobacco: Never  Vaping Use   Vaping status: Never Used  Substance and Sexual Activity   Alcohol use: No   Drug use: Never   Sexual activity: Not Currently  Other Topics Concern   Not on file  Social History Narrative   Lives with her husband. He husband lives in [place]    Social Drivers of Health   Financial Resource Strain: Not on file  Food Insecurity: Not on file  Transportation Needs: Not on file  Physical Activity: Not on file  Stress: Not on file  Social Connections: Not on file  Intimate Partner Violence: Not on file    Outpatient Medications Prior to Visit  Medication Sig Dispense Refill   acetaminophen  (TYLENOL ) 500 MG tablet Take 2 tablets (1,000 mg total) by mouth every 6 (six) hours as needed. 30 tablet 0   atorvastatin  (LIPITOR) 80 MG tablet Take 1 tablet (80 mg total) by mouth daily. 90 tablet 3   glucose blood (ONETOUCH VERIO) test strip Use to check blood sugar 3 times daily. E11.69 100 each 6   ibuprofen  (ADVIL ) 600 MG tablet Take 1 tablet (600 mg total) by mouth every 8 (eight) hours as needed. 60 tablet 1   Insulin  Pen Needle (B-D UF III MINI PEN NEEDLES) 31G X 5 MM MISC Use as instructed.  Inject into the skin once nightly. 100 each 6   metFORMIN  (GLUCOPHAGE ) 1000 MG tablet Take 1 tablet (1,000 mg total) by mouth 2 (two) times daily with a meal. 180 tablet 2   tirzepatide  (MOUNJARO ) 5 MG/0.5ML Pen Inject 5 mg into the skin once a week. 6 mL 3   No facility-administered medications prior to visit.    Allergies  Allergen Reactions   Jardiance  [Empagliflozin ] Other (See Comments)    HEADACHE/DIZZINESS    ROS Review of Systems  Constitutional:  Negative for appetite change, chills, fatigue and fever.  HENT:  Negative for congestion, postnasal drip, rhinorrhea and sneezing.   Eyes:  Positive for visual disturbance.  Respiratory:  Negative for cough, shortness of breath and wheezing.   Cardiovascular:  Negative for chest pain, palpitations and leg swelling.  Gastrointestinal:  Negative for abdominal pain, constipation, nausea and vomiting.  Genitourinary:  Negative for difficulty urinating, dysuria, flank pain and frequency.  Musculoskeletal:  Positive for arthralgias and back pain. Negative for joint swelling and myalgias.  Skin:  Negative for color change, pallor, rash and wound.  Neurological:  Negative for dizziness, facial asymmetry, weakness, numbness and headaches.  Psychiatric/Behavioral:  Negative for behavioral problems, confusion, self-injury and suicidal ideas.       Objective:    Physical Exam Vitals and nursing note reviewed.  Constitutional:      General: She is not in acute distress.    Appearance: Normal appearance. She is obese. She is not ill-appearing, toxic-appearing or diaphoretic.  Eyes:     General: No scleral icterus.       Right eye: No discharge.        Left eye: No discharge.     Extraocular Movements: Extraocular movements intact.     Conjunctiva/sclera: Conjunctivae normal.  Cardiovascular:     Rate and Rhythm: Normal rate and regular rhythm.     Pulses: Normal pulses.     Heart sounds: Normal heart sounds. No murmur heard.    No  friction rub. No gallop.  Pulmonary:     Effort: Pulmonary effort is normal. No respiratory distress.     Breath sounds: Normal breath sounds. No stridor. No wheezing, rhonchi or rales.  Chest:     Chest wall: No tenderness.  Abdominal:     General: There is no distension.     Palpations: Abdomen is soft.     Tenderness: There is no abdominal tenderness. There is no right CVA tenderness, left CVA tenderness or guarding.  Musculoskeletal:        General: Tenderness present. No swelling, deformity or signs of injury.     Right lower leg: No edema.     Left lower leg: No edema.  Comments: Tenderness on palpation of left heel, right knee, low back.  No redness or swelling noted, skin warm and dry  Skin:    General: Skin is warm and dry.     Capillary Refill: Capillary refill takes less than 2 seconds.     Coloration: Skin is not jaundiced or pale.     Findings: No bruising, erythema or lesion.  Neurological:     Mental Status: She is alert and oriented to person, place, and time.     Motor: No weakness.     Gait: Gait normal.  Psychiatric:        Mood and Affect: Mood normal.        Behavior: Behavior normal.        Thought Content: Thought content normal.        Judgment: Judgment normal.     BP 124/82   Pulse 85   Temp (!) 97.3 F (36.3 C)   Wt 235 lb (106.6 kg)   SpO2 100%   BMI 37.93 kg/m  Wt Readings from Last 3 Encounters:  05/08/24 235 lb (106.6 kg)  01/28/24 234 lb (106.1 kg)  12/24/23 234 lb (106.1 kg)    Lab Results  Component Value Date   TSH 3.570 12/11/2020   Lab Results  Component Value Date   WBC 5.6 12/24/2023   HGB 12.3 12/24/2023   HCT 39.2 12/24/2023   MCV 80 12/24/2023   PLT 239 12/24/2023   Lab Results  Component Value Date   NA 134 12/24/2023   K 4.6 12/24/2023   CO2 21 12/24/2023   GLUCOSE 266 (H) 12/24/2023   BUN 11 12/24/2023   CREATININE 0.65 12/24/2023   BILITOT 0.3 12/24/2023   ALKPHOS 71 12/24/2023   AST 19 12/24/2023    ALT 25 12/24/2023   PROT 7.2 12/24/2023   ALBUMIN 4.5 12/24/2023   CALCIUM  9.4 12/24/2023   ANIONGAP 11 01/30/2018   EGFR 113 12/24/2023   Lab Results  Component Value Date   CHOL 174 12/11/2020   Lab Results  Component Value Date   HDL 49 12/11/2020   Lab Results  Component Value Date   LDLCALC 96 12/11/2020   Lab Results  Component Value Date   TRIG 166 (H) 12/11/2020   Lab Results  Component Value Date   CHOLHDL 3.6 12/11/2020   Lab Results  Component Value Date   HGBA1C 7.6 (A) 05/08/2024      Assessment & Plan:   Problem List Items Addressed This Visit       Endocrine   Type 2 diabetes mellitus without complication, without long-term current use of insulin  (HCC) - Primary   Lab Results  Component Value Date   HGBA1C 7.6 (A) 05/08/2024   Improved glycemic control with A1c reduced from 8.8% to 7.6%. Goal A1c <7%. - Continue metformin  1000 mg orally twice daily with meals. - tirzepatide  (Mounjaro ) to 7.5mg  subcutaneously once a week. - Advise to reduce intake of bread, rice, and pasta. - Encourage regular physical activity, especially on days off work. CBG goals discussed Advise to schedule an eye exam and contact insurance for providers accepting her plan. Diabetic foot exam completed      Relevant Medications   tirzepatide  (MOUNJARO ) 7.5 MG/0.5ML Pen   Other Relevant Orders   POCT glycosylated hemoglobin (Hb A1C) (Completed)   Basic Metabolic Panel     Other   Low back pain   Chronic low back pain Previous Xray shows Mild curvature of spine. No  significant degenerative joint changes.  Temporary relief with ibuprofen  and topical ice cream. Cautioned against excessive ibuprofen  use. Was referred to [facility] at her last visit but did not follow up, their contact information provided , advised to schedule an appointment with them  Lidocaine  5% patch ordered       Relevant Medications   lidocaine  (LIDODERM ) 5 %   Other Relevant Orders   DG Knee  Complete 4 Views Right   Obesity with serious comorbidity   Wt Readings from Last 3 Encounters:  05/08/24 235 lb (106.6 kg)  01/28/24 234 lb (106.1 kg)  12/24/23 234 lb (106.1 kg)   Body mass index is 37.93 kg/m.  Morbid obesity No weight loss since starting tirzepatide . Current weight 235 lbs. - Encourage regular exercise. - Advise dietary modifications focusing on reducing carbohydrates and increasing protein intake. Increase Mounjaro  to 7.5 mg weekly      Relevant Medications   tirzepatide  (MOUNJARO ) 7.5 MG/0.5ML Pen   Iron  deficiency anemia   She has stopped seeing hematologist, Will recheck labs Lab Results  Component Value Date   WBC 5.6 12/24/2023   HGB 12.3 12/24/2023   HCT 39.2 12/24/2023   MCV 80 12/24/2023   PLT 239 12/24/2023         Relevant Orders   CBC   Iron , TIBC and Ferritin Panel   Ferritin   Iron  and TIBC([facility])   Chronic pain of right knee   Right knee pain Chronic pain 9/10, sometimes 10/10, radiates from hip to knee. Temporary relief with ibuprofen  and topical ice cream. Cautioned against excessive ibuprofen  use. - Ordered x-ray of right knee which has not been completed , test reordered Was referred to [facility] at her last visit but did not follow up, their contact information provided , advised to schedule an appointment with them  Lidocaine  5% patch ordered - Refer to orthopedic specialist for further evaluation and possible knee injection. - Advise use of knee brace available at [facility].      Relevant Orders   DG Knee Complete 4 Views Right   Dyslipidemia, goal LDL below 70   Lab Results  Component Value Date   CHOL 174 12/11/2020   HDL 49 12/11/2020   LDLCALC 96 12/11/2020   LDLDIRECT 51 01/28/2024   TRIG 166 (H) 12/11/2020   CHOLHDL 3.6 12/11/2020   Managed with atorvastatin  80 mg daily. - Continue atorvastatin  80 mg orally daily.      Vitamin D  deficiency   Checking labs      Relevant Orders   VITAMIN D  25 Hydroxy  (Vit-D Deficiency, Fractures)   Pain of left heel   Left heel pain Pain onset two weeks ago, does a lot of standing at work ,Temporary relief with ibuprofen  and topical ice cream. - Evaluate further if symptoms persist after addressing right knee pain. Toradol  30mg  injection given today , advised not to take ibuprofen  again today Use of shoes with soft sole advised       Fatigue    Persistent fatigue despite adequate sleep. Reports blurry vision upon waking, resolves after a few minutes.- Order TSH and vitamin D  levels iron  panel, CBC      Relevant Orders   CBC   TSH   Iron , TIBC and Ferritin Panel   Ferritin   Iron  and TIBC([facility])   Blurred vision   Advised to follow-up with ophthalmology       Meds ordered this encounter  Medications   lidocaine  (LIDODERM ) 5 %  Sig: Place 1 patch onto the skin daily. Remove & Discard patch within 12 hours or as directed by MD    Dispense:  30 patch    Refill:  0   tirzepatide  (MOUNJARO ) 7.5 MG/0.5ML Pen    Sig: Inject 7.5 mg into the skin once a week.    Dispense:  2 mL    Refill:  1   ketorolac  (TORADOL ) 30 MG/ML injection 30 mg    Follow-up: Return in about 3 months (around 08/07/2024) for DM.    Suman Trivedi R Karlea Mckibbin, FNP

## 2024-05-08 NOTE — Assessment & Plan Note (Addendum)
 Wt Readings from Last 3 Encounters:  05/08/24 235 lb (106.6 kg)  01/28/24 234 lb (106.1 kg)  12/24/23 234 lb (106.1 kg)   Body mass index is 37.93 kg/m.  Morbid obesity No weight loss since starting tirzepatide . Current weight 235 lbs. - Encourage regular exercise. - Advise dietary modifications focusing on reducing carbohydrates and increasing protein intake. Increase Mounjaro  to 7.5 mg weekly

## 2024-05-08 NOTE — Patient Instructions (Addendum)
   Please go to the address below to get your knee x-ray done,  Address: [address] [place], [place], [place] [address] Phone: [phone]    Lisa [name] [address] [place] [address]  P:  [phone] F:  [phone] also encouraged to follow-up with orthopedics.  At the above address for your knee pain and heel pain   1. Type 2 diabetes mellitus without complication, without long-term current use of insulin  (HCC) (Primary)  - POCT glycosylated hemoglobin (Hb A1C) - Basic Metabolic Panel - tirzepatide  (MOUNJARO ) 7.5 MG/0.5ML Pen; Inject 7.5 mg into the skin once a week.  Dispense: 2 mL; Refill: 1  2. Iron  deficiency anemia, unspecified iron  deficiency anemia type  - CBC - Iron , TIBC and Ferritin Panel - Ferritin - Iron  and TIBC([facility])  3. Fatigue, unspecified type  - CBC - TSH  4. Vitamin D  deficiency  - VITAMIN D  25 Hydroxy (Vit-D Deficiency, Fractures)  5. Chronic pain of right knee  - DG Knee Complete 4 Views Right; Future  6. Pain of left heel   7. Chronic midline low back pain with left-sided sciatica  - lidocaine  (LIDODERM ) 5 %; Place 1 patch onto the skin daily. Remove & Discard patch within 12 hours or as directed by MD  Dispense: 30 patch; Refill: 0     It is important that you exercise regularly at least 30 minutes 5 times a week as tolerated  Think about what you will eat, plan ahead. Choose  clean, green, fresh or frozen over canned, processed or packaged foods which are more sugary, salty and fatty. 70 to 75% of food eaten should be vegetables and fruit. Three meals at set times with snacks allowed between meals, but they must be fruit or vegetables. Aim to eat over a 12 hour period , example 7 am to 7 pm, and STOP after  your last meal of the day. Drink water,generally about 64 ounces per day, no other drink is as healthy. Fruit juice is best enjoyed in a healthy way, by EATING the fruit.  Thanks for choosing [facility] we consider it a privelige to serve you.

## 2024-05-08 NOTE — Assessment & Plan Note (Signed)
 Lab Results  Component Value Date   CHOL 174 12/11/2020   HDL 49 12/11/2020   LDLCALC 96 12/11/2020   LDLDIRECT 51 01/28/2024   TRIG 166 (H) 12/11/2020   CHOLHDL 3.6 12/11/2020   Managed with atorvastatin  80 mg daily. - Continue atorvastatin  80 mg orally daily.

## 2024-05-08 NOTE — Assessment & Plan Note (Signed)
 She has stopped seeing hematologist, Will recheck labs Lab Results  Component Value Date   WBC 5.6 12/24/2023   HGB 12.3 12/24/2023   HCT 39.2 12/24/2023   MCV 80 12/24/2023   PLT 239 12/24/2023

## 2024-05-08 NOTE — Assessment & Plan Note (Signed)
 Checking labs

## 2024-05-08 NOTE — Assessment & Plan Note (Signed)
 Right knee pain Chronic pain 9/10, sometimes 10/10, radiates from hip to knee. Temporary relief with ibuprofen  and topical ice cream. Cautioned against excessive ibuprofen  use. - Ordered x-ray of right knee which has not been completed , test reordered Was referred to otho at her last visit but did not follow up, their contact information provided , advised to schedule an appointment with them  Lidocaine  5% patch ordered - Refer to orthopedic specialist for further evaluation and possible knee injection. - Advise use of knee brace available at [facility].

## 2024-05-08 NOTE — Assessment & Plan Note (Signed)
  Persistent fatigue despite adequate sleep. Reports blurry vision upon waking, resolves after a few minutes.- Order TSH and vitamin D  levels iron  panel, CBC

## 2024-05-08 NOTE — Assessment & Plan Note (Addendum)
 Chronic low back pain Previous Xray shows Mild curvature of spine. No significant degenerative joint changes.  Temporary relief with ibuprofen  and topical ice cream. Cautioned against excessive ibuprofen  use. Was referred to otho at her last visit but did not follow up, their contact information provided , advised to schedule an appointment with them  Lidocaine  5% patch ordered

## 2024-05-09 ENCOUNTER — Encounter: Payer: Self-pay | Admitting: Hematology and Oncology

## 2024-05-09 ENCOUNTER — Telehealth: Payer: Self-pay

## 2024-05-09 ENCOUNTER — Ambulatory Visit: Payer: Self-pay | Admitting: Nurse Practitioner

## 2024-05-09 ENCOUNTER — Other Ambulatory Visit: Payer: Self-pay

## 2024-05-09 DIAGNOSIS — E559 Vitamin D deficiency, unspecified: Secondary | ICD-10-CM

## 2024-05-09 LAB — BASIC METABOLIC PANEL WITH GFR
BUN/Creatinine Ratio: 19 (ref 9–23)
BUN: 11 mg/dL (ref 6–24)
CO2: 23 mmol/L (ref 20–29)
Calcium: 9.5 mg/dL (ref 8.7–10.2)
Chloride: 104 mmol/L (ref 96–106)
Creatinine, Ser: 0.57 mg/dL (ref 0.57–1.00)
Glucose: 132 mg/dL — ABNORMAL HIGH (ref 70–99)
Potassium: 4 mmol/L (ref 3.5–5.2)
Sodium: 139 mmol/L (ref 134–144)
eGFR: 116 mL/min/1.73 (ref >59)

## 2024-05-09 LAB — CBC
Hematocrit: 37.7 % (ref 34.0–46.6)
Hemoglobin: 12 g/dL (ref 11.1–15.9)
MCH: 25.2 pg — ABNORMAL LOW (ref 26.6–33.0)
MCHC: 31.8 g/dL (ref 31.5–35.7)
MCV: 79 fL (ref 79–97)
Platelets: 248 x10E3/uL (ref 150–450)
RBC: 4.77 x10E6/uL (ref 3.77–5.28)
RDW: 13.6 % (ref 11.7–15.4)
WBC: 5.3 x10E3/uL (ref 3.4–10.8)

## 2024-05-09 LAB — IRON AND TIBC
Iron Saturation: 14 % — ABNORMAL LOW (ref 15–55)
Iron: 44 ug/dL (ref 27–159)
Total Iron Binding Capacity: 324 ug/dL (ref 250–450)
UIBC: 280 ug/dL (ref 131–425)

## 2024-05-09 LAB — FERRITIN: Ferritin: 24 ng/mL (ref 15–150)

## 2024-05-09 LAB — VITAMIN D 25 HYDROXY (VIT D DEFICIENCY, FRACTURES): Vit D, 25-Hydroxy: 13.5 ng/mL — ABNORMAL LOW (ref 30.0–100.0)

## 2024-05-09 LAB — TSH: TSH: 4.54 u[IU]/mL — ABNORMAL HIGH (ref 0.450–4.500)

## 2024-05-09 MED ORDER — VITAMIN D (ERGOCALCIFEROL) 1.25 MG (50000 UNIT) PO CAPS
50000.0000 [IU] | ORAL_CAPSULE | ORAL | 0 refills | Status: AC
  Filled 2024-05-09: qty 8, 56d supply, fill #0

## 2024-05-09 NOTE — Telephone Encounter (Signed)
 Pharmacy Patient Advocate Encounter   Received notification from CoverMyMeds that prior authorization for LIDOCAINE  5% PATCH is required/requested.   Insurance verification completed.   The patient is insured through [employer] (COMMERCIAL) .   Per test claim: PA required; PA submitted to above mentioned insurance via CoverMyMeds Key/confirmation #/EOC [id number] Status is pending

## 2024-05-10 ENCOUNTER — Other Ambulatory Visit: Payer: Self-pay

## 2024-05-10 NOTE — Telephone Encounter (Signed)
 Pharmacy Patient Advocate Encounter  Received notification from [facility] [employer] (COMMERCIAL) that Prior Authorization for LIDOCAINE  5% PATCH has been DENIED.  Full denial letter will be uploaded to the media tab. See denial reason below.     OTC Lidocaine  4% patch recommended to patient at pharmacy.

## 2024-05-11 ENCOUNTER — Ambulatory Visit

## 2024-05-11 ENCOUNTER — Telehealth: Payer: Self-pay

## 2024-05-11 NOTE — Telephone Encounter (Signed)
 Copied from CRM #[id number]. Topic: Clinical - Request for Lab/Test Order >> May 11, 2024  4:04 PM Selinda RAMAN wrote: Reason for CRM: The patient called in stating [facility] on [address] told her they do not accept her insurance. She would like to know where she can go to get her knee xrays done that accepts her insurance. Please assist patient further  Pt was advised to go to [facility] for x ray. KH

## 2024-05-11 NOTE — Telephone Encounter (Signed)
 Copied from CRM #[id number]. Topic: Referral - Question >> May 11, 2024  4:05 PM Selinda RAMAN wrote: Reason for CRM: The patient states she never heard from [facility] in [place] where her referral was sent back in April. She wanted her provider to know and she if she suggest or will refer her elsewhere for her continued back and knee pain  Will clal to find out what happened. KH

## 2024-05-12 ENCOUNTER — Other Ambulatory Visit: Payer: Self-pay

## 2024-05-12 ENCOUNTER — Ambulatory Visit (HOSPITAL_COMMUNITY)
Admission: RE | Admit: 2024-05-12 | Discharge: 2024-05-12 | Disposition: A | Discharge status: Home / Self Care | Source: Ambulatory Visit | Attending: Nurse Practitioner | Admitting: Nurse Practitioner

## 2024-05-12 DIAGNOSIS — M5442 Lumbago with sciatica, left side: Secondary | ICD-10-CM | POA: Diagnosis present

## 2024-05-12 DIAGNOSIS — G8929 Other chronic pain: Secondary | ICD-10-CM | POA: Insufficient documentation

## 2024-05-12 DIAGNOSIS — M25561 Pain in right knee: Secondary | ICD-10-CM | POA: Diagnosis present

## 2024-05-18 ENCOUNTER — Other Ambulatory Visit: Payer: Self-pay

## 2024-05-19 ENCOUNTER — Ambulatory Visit: Admitting: Physician Assistant

## 2024-05-23 ENCOUNTER — Ambulatory Visit (INDEPENDENT_AMBULATORY_CARE_PROVIDER_SITE_OTHER): Admitting: Physician Assistant

## 2024-05-23 ENCOUNTER — Encounter: Payer: Self-pay | Admitting: Physician Assistant

## 2024-05-23 DIAGNOSIS — M5441 Lumbago with sciatica, right side: Secondary | ICD-10-CM | POA: Diagnosis not present

## 2024-05-23 DIAGNOSIS — M25561 Pain in right knee: Secondary | ICD-10-CM | POA: Diagnosis not present

## 2024-05-23 DIAGNOSIS — G8929 Other chronic pain: Secondary | ICD-10-CM | POA: Diagnosis not present

## 2024-05-23 MED ORDER — MELOXICAM 15 MG PO TABS: 15.0000 mg | ORAL_TABLET | Freq: Every day | ORAL | 0 refills | Status: DC

## 2024-05-23 NOTE — Progress Notes (Signed)
 Office Visit Note   Patient: Lisa [name]           Date of Birth: 1980/09/11           MRN: [id number] Visit Date: 05/23/2024              Requested by: Lisa Thomes SAUNDERS, FNP [phone] [address], [address] [place],  [place] [address] PCP: Paseda, Folashade R, FNP   Assessment & Plan: Visit Diagnoses:  1. Chronic pain of right knee   2. Chronic bilateral low back pain with right-sided sciatica     Plan: With regards to her right knee most of her pain is medially she has no loss of stability no effusion no erythema.  Would recommend a period of physical therapy.  Will return in 4 weeks if PT does not help her much could consider an MRI I do not see significant degenerative changes on her x-ray.  With regards to her back seems sciatic in nature but her strength is intact she does have a straight leg raise.  And pain with flexion and extension.  No paresthesias.  Would again promote physical therapy and anti-inflammatory.  Follow-Up Instructions: Return in about 4 weeks (around 06/20/2024).   Orders:  Orders Placed This Encounter  Procedures   Ambulatory referral to Physical Therapy   Meds ordered this encounter  Medications   meloxicam  (MOBIC ) 15 MG tablet    Sig: Take 1 tablet (15 mg total) by mouth daily.    Dispense:  30 tablet    Refill:  0      Procedures: No procedures performed   Clinical Data: No additional findings.   Subjective: Chief Complaint  Patient presents with   Lower Back - Pain   Right Knee - Pain    HPI patient is a pleasant 43 year old woman who is seen with the help of the interpreter today.  She has a chronic history of right knee pain and right low back pain.  She does not recall any injury however she does have a job that requires standing on her feet for 8 hours a day.  Denies any fever or chills.  She has treated this with topical medicine and ibuprofen .  No real instability.  No instability in the back no loss of bowel or bladder  control pain is on the right posterior back.  Review of Systems  All other systems reviewed and are negative.    Objective: Vital Signs: LMP  (LMP Unknown)   Physical Exam Constitutional:      Appearance: Normal appearance.  Pulmonary:     Effort: Pulmonary effort is normal.     Breath sounds: Normal breath sounds.  Skin:    General: Skin is warm and dry.  Neurological:     General: No focal deficit present.     Mental Status: She is alert and oriented to person, place, and time.  Psychiatric:        Mood and Affect: Mood normal.        Behavior: Behavior normal.     Ortho Exam Examination of her right knee she has no effusion no erythema she has good extension and flexion strength.  Good varus valgus stability good endpoint on anterior draw she does have some tenderness over the medial joint line. Examination of her low back she has some discomfort with forward flexion and extension.  She has good strength with resisted dorsiflexion plantarflexion extension flexion of her legs she does have a positive straight  leg raise that reproduces pain going down her right leg sensation is intact Specialty Comments:  No specialty comments available.  Imaging: No results found.   PMFS History: Patient Active Problem List   Diagnosis Date Noted   Pain of left heel 05/08/2024   Fatigue 05/08/2024   Blurred vision 05/08/2024   Chronic pain of right knee 12/24/2023   Dyslipidemia, goal LDL below 70 12/24/2023   Vitamin D  deficiency 12/24/2023   Iron  deficiency anemia 06/01/2023   Obesity with serious comorbidity    Diabetes mellitus (HCC)    Type 2 diabetes mellitus without complication, without long-term current use of insulin  (HCC) 09/22/2019   Low back pain 02/28/2019   High-risk pregnancy 08/15/2012   Short cervix, antepartum 08/08/2012   Twin gestation, dichorionic diamniotic 07/18/2012   Gestational diabetes mellitus, antepartum 07/18/2012   Past Medical History:   Diagnosis Date   COVID-19    Diabetes mellitus (HCC)    Iron  deficiency anemia 06/01/2023   Morbid obesity (HCC)     Family History  Problem Relation Age of Onset   Diabetes type II Mother    Diabetes Mellitus II Father    Diabetes Maternal Grandmother    Diabetes Maternal Grandfather    Other Neg Hx     No past surgical history on file. Social History   Occupational History   Not on file  Tobacco Use   Smoking status: Never   Smokeless tobacco: Never  Vaping Use   Vaping status: Never Used  Substance and Sexual Activity   Alcohol use: No   Drug use: Never   Sexual activity: Not Currently

## 2024-06-20 ENCOUNTER — Ambulatory Visit: Admitting: Physician Assistant

## 2024-06-21 ENCOUNTER — Other Ambulatory Visit: Payer: Self-pay | Admitting: Physician Assistant

## 2024-06-28 ENCOUNTER — Ambulatory Visit: Admitting: Physician Assistant

## 2024-06-28 NOTE — Therapy (Unsigned)
 OUTPATIENT PHYSICAL THERAPY THORACOLUMBAR EVALUATION   Patient Name: Lisa [name] MRN: [id number] DOB:03-30-1981, 43 y.o., female Today's Date: 06/29/2024  END OF SESSION:  PT End of Session - 06/29/24 1730     Visit Number 1    Number of Visits 6    Date for Recertification  08/29/24    Authorization Type [employer]    PT Start Time 1645    PT Stop Time 1730    PT Time Calculation (min) 45 min    Activity Tolerance Patient tolerated treatment well    Behavior During Therapy [facility] for tasks assessed/performed          Past Medical History:  Diagnosis Date   COVID-19    Diabetes mellitus (HCC)    Iron  deficiency anemia 06/01/2023   Morbid obesity (HCC)    History reviewed. No pertinent surgical history. Patient Active Problem List   Diagnosis Date Noted   Pain of left heel 05/08/2024   Fatigue 05/08/2024   Blurred vision 05/08/2024   Chronic pain of right knee 12/24/2023   Dyslipidemia, goal LDL below 70 12/24/2023   Vitamin D  deficiency 12/24/2023   Iron  deficiency anemia 06/01/2023   Obesity with serious comorbidity    Diabetes mellitus (HCC)    Type 2 diabetes mellitus without complication, without long-term current use of insulin  (HCC) 09/22/2019   Low back pain 02/28/2019   High-risk pregnancy 08/15/2012   Short cervix, antepartum 08/08/2012   Twin gestation, dichorionic diamniotic 07/18/2012   Gestational diabetes mellitus, antepartum 07/18/2012    PCP: Paseda, Folashade R, FNP   REFERRING PROVIDER: Persons, Ronal Dragon, PA  REFERRING DIAG: [phone] (ICD-10-CM) - Chronic pain of right knee G89.29,M54.41 (ICD-10-CM) - Chronic bilateral low back pain with right-sided sciatica  Rationale for Evaluation and Treatment: Rehabilitation  THERAPY DIAG:  Other low back pain  Chronic pain of right knee  ONSET DATE: chronic  SUBJECTIVE:                                                                                                                                                                                            SUBJECTIVE STATEMENT: HPI patient is a pleasant 43 year old woman who is seen with the help of the interpreter today.  She has a chronic history of right knee pain and right low back pain.  She does not recall any injury however she does have a job that requires standing on her feet for 8 hours a day.  Denies any fever or chills.  She has treated this with topical medicine and ibuprofen .  No real instability.  No instability in the back no loss of  bowel or bladder control pain is on the right posterior back.   Review of Systems  All other systems reviewed and are negative.  PERTINENT HISTORY:  1. Chronic pain of right knee   2. Chronic bilateral low back pain with right-sided sciatica       Plan: With regards to her right knee most of her pain is medially she has no loss of stability no effusion no erythema.  Would recommend a period of physical therapy.  Will return in 4 weeks if PT does not help her much could consider an MRI I do not see significant degenerative changes on her x-ray.  With regards to her back seems sciatic in nature but her strength is intact she does have a straight leg raise.  And pain with flexion and extension.  No paresthesias.  Would again promote physical therapy and anti-inflammatory.  PAIN:  Are you having pain? Yes: NPRS scale: 10/10 Pain location: Low back and R knee Pain description: ache Aggravating factors: prolonged standing Relieving factors: lying down and meds  PRECAUTIONS: None  RED FLAGS: None   WEIGHT BEARING RESTRICTIONS: No  FALLS:  Has patient fallen in last 6 months? No  OCCUPATION: Retail(standing)  PLOF: Independent  PATIENT GOALS: To manage my symptoms  NEXT MD VISIT: 4 weeks  OBJECTIVE:  Note: Objective measures were completed at Evaluation unless otherwise noted.  DIAGNOSTIC FINDINGS:  IMPRESSION: No acute fracture or dislocation. Mild curvature of  spine. No significant degenerative joint changes.     Electronically Signed   By: Craig Farr M.D.   On: 04/23/2023 11:35  PATIENT SURVEYS:  ODI 15/50  MUSCLE LENGTH: Hamstrings: Right 70 deg; Left 70 deg   POSTURE: rounded shoulders  PALPATION: deferred  LUMBAR ROM:   AROM eval  Flexion WNL  Extension WNL  Right lateral flexion   Left lateral flexion   Right rotation WNL  Left rotation WNL   (Blank rows = not tested)  LOWER EXTREMITY ROM:   WFL for gait and transfers  Active  Right eval Left eval  Hip flexion    Hip extension    Hip abduction    Hip adduction    Hip internal rotation    Hip external rotation    Knee flexion    Knee extension    Ankle dorsiflexion    Ankle plantarflexion    Ankle inversion    Ankle eversion     (Blank rows = not tested)  LOWER EXTREMITY MMT:  see 30s chair stand test  MMT Right eval Left eval  Hip flexion    Hip extension    Hip abduction    Hip adduction    Hip internal rotation    Hip external rotation    Knee flexion    Knee extension    Ankle dorsiflexion    Ankle plantarflexion    Ankle inversion    Ankle eversion     (Blank rows = not tested)  LUMBAR SPECIAL TESTS:  Straight leg raise test: Negative and Slump test: symptomatic R  FUNCTIONAL TESTS:  30 seconds chair stand test 8 reps arms crossed   GAIT: Distance walked: 72ftx2 Assistive device utilized: None Level of assistance: Complete Independence Comments: unremarkable  TREATMENT:  [facility] Adult PT Treatment:                                                DATE: 06/29/24 Eval and HEP Self Care: Additional minutes spent for educating on updated Therapeutic Home Exercise Program as well as comparing current status to condition at start of symptoms. This included exercises focusing on stretching, strengthening, with focus on  eccentric aspects. Long term goals include an improvement in range of motion, strength, endurance as well as avoiding reinjury. Patient's frequency would include in 1-2 times a day, 3-5 times a week for a duration of 6-12 weeks. Proper technique shown and discussed handout in great detail. All questions were discussed and addressed.      PATIENT EDUCATION:  Education details: Discussed eval findings, rehab rationale and POC and patient is in agreement  Person educated: Patient Education method: Explanation and Handouts Education comprehension: verbalized understanding and needs further education  HOME EXERCISE PROGRAM:  Access Code: [email or web address] URL: [email or web address] Date: 06/29/2024 Prepared by: Reyes Kohut  Exercises - Supine 90/90 Abdominal Bracing  - 2 x daily - 5 x weekly - 1 sets - 2 reps - 30s hold - Seated Sciatic Tensioner  - 2 x daily - 5 x weekly - 2 sets - 10 reps - Sit to Stand with Arms Crossed  - 2 x daily - 5 x weekly - 1 sets - 5 reps  ASSESSMENT:  CLINICAL IMPRESSION: Patient is a 43 y.o. female who was seen today for physical therapy evaluation and treatment for chronic low back and R knee pain.  R slump test elicits RLE symptoms which improve with repetition, R SLR negative.  Trunk and LE ROM WNL but LE and core strength deficits noted with 90/90 position hold and 30s chair stand test.  Patient is a good PT candidate to impreove LE and core strength and promote sciatic nerve mobility.    OBJECTIVE IMPAIRMENTS: decreased activity tolerance, decreased endurance, decreased knowledge of condition, decreased mobility, improper body mechanics, obesity, and pain.   ACTIVITY LIMITATIONS: carrying, lifting, bending, and standing  PERSONAL FACTORS: Age, Behavior pattern, and Fitness are also affecting patient's functional outcome.   REHAB POTENTIAL: Fair due to chronicity, body habitus and language barrier  CLINICAL DECISION MAKING:  Stable/uncomplicated  EVALUATION COMPLEXITY: Moderate   GOALS: Goals reviewed with patient? No   SHORT TERM GOALS=LONG TERM GOALS: Target date: 08/10/2024    Patient will increase 30s chair stand reps from 8 to 10 without arms to demonstrate and improved functional ability with less pain/difficulty as well as reduce fall risk.  Baseline: 8 Goal status: INITIAL  2.  Patient will acknowledge 6/10 pain at least once during episode of care   Baseline: 10/10 Goal status: INITIAL  3.  Patient will score at least 8/50 on ODI to signify clinically meaningful improvement in functional abilities.   Baseline: 15/50 Goal status: INITIAL  4.  Patient to demonstrate independence in HEP  Baseline: [id number] Goal status: INITIAL  5.  Negative R slump test Baseline: + R slump Goal status: INITIAL    PLAN:  PT FREQUENCY: 1x/week  PT DURATION: 6 weeks  PLANNED INTERVENTIONS: 97110-Therapeutic exercises, 97530- Therapeutic activity, [id number]- Neuromuscular re-education, 97535- Self Care, [id number]- Manual therapy, and Patient/Family education.  PLAN FOR NEXT SESSION: HEP review and update, manual techniques as appropriate, aerobic tasks, ROM and flexibility  activities, strengthening and PREs, TPDN, gait and balance training,aquatic therapy, modalities for pain and NMRE    For all possible CPT codes, reference the Planned Interventions line above.     Check all conditions that are expected to impact treatment: {Conditions expected to impact treatment:Diabetes mellitus   If treatment provided at initial evaluation, no treatment charged due to lack of authorization.       Rex Oesterle M Stanislaus Kaltenbach, PT 06/29/2024, 5:31 PM

## 2024-06-29 ENCOUNTER — Ambulatory Visit: Attending: Physician Assistant

## 2024-06-29 ENCOUNTER — Other Ambulatory Visit: Payer: Self-pay

## 2024-06-29 DIAGNOSIS — M5459 Other low back pain: Secondary | ICD-10-CM | POA: Diagnosis present

## 2024-06-29 DIAGNOSIS — G8929 Other chronic pain: Secondary | ICD-10-CM | POA: Insufficient documentation

## 2024-06-29 DIAGNOSIS — M5441 Lumbago with sciatica, right side: Secondary | ICD-10-CM | POA: Diagnosis not present

## 2024-06-29 DIAGNOSIS — M25561 Pain in right knee: Secondary | ICD-10-CM | POA: Insufficient documentation

## 2024-06-30 ENCOUNTER — Ambulatory Visit: Admitting: Physician Assistant

## 2024-06-30 ENCOUNTER — Other Ambulatory Visit: Payer: Self-pay

## 2024-06-30 DIAGNOSIS — M5442 Lumbago with sciatica, left side: Secondary | ICD-10-CM | POA: Diagnosis not present

## 2024-06-30 DIAGNOSIS — M5441 Lumbago with sciatica, right side: Secondary | ICD-10-CM | POA: Diagnosis not present

## 2024-06-30 DIAGNOSIS — G8929 Other chronic pain: Secondary | ICD-10-CM | POA: Diagnosis not present

## 2024-06-30 DIAGNOSIS — M25561 Pain in right knee: Secondary | ICD-10-CM | POA: Diagnosis not present

## 2024-06-30 MED ORDER — MELOXICAM 15 MG PO TABS
15.0000 mg | ORAL_TABLET | Freq: Every day | ORAL | 0 refills | Status: DC
  Filled 2024-06-30: qty 30, 30d supply, fill #0

## 2024-06-30 NOTE — Progress Notes (Signed)
 Office Visit Note   Patient: Lisa [name]           Date of Birth: Jun 29, 1981           MRN: [id number] Visit Date: 06/30/2024              Requested by: Juanice Thomes SAUNDERS, FNP [phone] [address], [address] [place],  [place] [address] PCP: Paseda, Folashade R, FNP  Chief Complaint  Patient presents with   Lower Back - Pain   Right Knee - Pain      HPI: Patient is a pleasant 43 year old woman accompanied by an interpreter.  I have seen her for right knee pain as well as low back pain.  At her last visit we agreed that she would do physical therapy and take anti-inflammatories.  Unfortunately her therapy had been delayed and she only had her first session yesterday though she did find it helpful  Assessment & Plan: Visit Diagnoses:  1. Chronic pain of right knee   2. Chronic bilateral low back pain with sciatica, sciatica laterality unspecified     Plan: Will call in a refill of meloxicam  for her per her request.  Will also follow-up with her in another month after she is done more therapy.  If she still had residual issues could consider an MRI  Follow-Up Instructions: No follow-ups on file.   Ortho Exam  Patient is alert, oriented, no adenopathy, well-dressed, normal affect, normal respiratory effort. Examination of her right knee she has some grinding she is neurovascular intact compartments are soft and nontender no effusion no erythema fires all the muscles of her lower leg Low back no step-offs redness or erythema strength is intact sensation is intact pain is mostly focused over the lower back   Imaging: No results found. No images are attached to the encounter.  Labs: Lab Results  Component Value Date   HGBA1C 7.6 (A) 05/08/2024   HGBA1C 8.8 (A) 12/24/2023   HGBA1C 6.9 12/11/2020   REPTSTATUS 06/11/2018 FINAL 06/09/2018   CULT  06/09/2018    NO GROUP A STREP (S.PYOGENES) ISOLATED Performed at [facility], [address] [place], [place]  [address]    [facility] ESCHERICHIA COLI 09/22/2012     Lab Results  Component Value Date   ALBUMIN 4.5 12/24/2023   ALBUMIN 4.3 12/11/2020   ALBUMIN 4.4 03/26/2020    No results found for: MG Lab Results  Component Value Date   VD25OH 13.5 (L) 05/08/2024    No results found for: PREALBUMIN    Latest Ref Rng & Units 05/08/2024    3:46 PM 12/24/2023    2:03 PM 09/06/2023    3:44 PM  CBC EXTENDED  WBC 3.4 - 10.8 x10E3/uL 5.3  5.6  4.8   RBC 3.77 - 5.28 x10E6/uL 4.77  4.90  5.06   Hemoglobin 11.1 - 15.9 g/dL 87.9  87.6  87.5   HCT 34.0 - 46.6 % 37.7  39.2  38.5   Platelets 150 - 450 x10E3/uL 248  239  243   NEUT# 1.7 - 7.7 K/uL   2.2   Lymph# 0.7 - 4.0 K/uL   2.2      There is no height or weight on file to calculate BMI.  Orders:  No orders of the defined types were placed in this encounter.  Meds ordered this encounter  Medications   meloxicam  (MOBIC ) 15 MG tablet    Sig: Take 1 tablet (15 mg total) by mouth  daily.    Dispense:  30 tablet    Refill:  0     Procedures: No procedures performed  Clinical Data: No additional findings.  ROS:  All other systems negative, except as noted in the HPI. Review of Systems  Objective: Vital Signs: There were no vitals taken for this visit.  Specialty Comments:  No specialty comments available.  PMFS History: Patient Active Problem List   Diagnosis Date Noted   Pain of left heel 05/08/2024   Fatigue 05/08/2024   Blurred vision 05/08/2024   Chronic pain of right knee 12/24/2023   Dyslipidemia, goal LDL below 70 12/24/2023   Vitamin D  deficiency 12/24/2023   Iron  deficiency anemia 06/01/2023   Obesity with serious comorbidity    Diabetes mellitus (HCC)    Type 2 diabetes mellitus without complication, without long-term current use of insulin  (HCC) 09/22/2019   Low back pain 02/28/2019   High-risk pregnancy 08/15/2012   Short cervix, antepartum 08/08/2012   Twin gestation, dichorionic diamniotic 07/18/2012    Gestational diabetes mellitus, antepartum 07/18/2012   Past Medical History:  Diagnosis Date   COVID-19    Diabetes mellitus (HCC)    Iron  deficiency anemia 06/01/2023   Morbid obesity (HCC)     Family History  Problem Relation Age of Onset   Diabetes type II Mother    Diabetes Mellitus II Father    Diabetes Maternal Grandmother    Diabetes Maternal Grandfather    Other Neg Hx     No past surgical history on file. Social History   Occupational History   Not on file  Tobacco Use   Smoking status: Never   Smokeless tobacco: Never  Vaping Use   Vaping status: Never Used  Substance and Sexual Activity   Alcohol use: No   Drug use: Never   Sexual activity: Not Currently

## 2024-07-03 ENCOUNTER — Other Ambulatory Visit (HOSPITAL_COMMUNITY): Payer: Self-pay

## 2024-07-03 ENCOUNTER — Ambulatory Visit (INDEPENDENT_AMBULATORY_CARE_PROVIDER_SITE_OTHER): Payer: Self-pay | Admitting: Nurse Practitioner

## 2024-07-03 ENCOUNTER — Other Ambulatory Visit: Payer: Self-pay

## 2024-07-03 ENCOUNTER — Encounter: Payer: Self-pay | Admitting: Nurse Practitioner

## 2024-07-03 ENCOUNTER — Encounter: Payer: Self-pay | Admitting: Radiology

## 2024-07-03 VITALS — BP 129/73 | HR 75 | Wt 230.0 lb

## 2024-07-03 DIAGNOSIS — E119 Type 2 diabetes mellitus without complications: Secondary | ICD-10-CM | POA: Diagnosis not present

## 2024-07-03 DIAGNOSIS — L299 Pruritus, unspecified: Secondary | ICD-10-CM | POA: Insufficient documentation

## 2024-07-03 DIAGNOSIS — Z1231 Encounter for screening mammogram for malignant neoplasm of breast: Secondary | ICD-10-CM | POA: Diagnosis not present

## 2024-07-03 MED ORDER — TIRZEPATIDE 7.5 MG/0.5ML ~~LOC~~ SOAJ
7.5000 mg | SUBCUTANEOUS | 1 refills | Status: AC
  Filled 2024-07-03 – 2024-07-18 (×4): qty 2, 28d supply, fill #0

## 2024-07-03 MED ORDER — METFORMIN HCL 1000 MG PO TABS
1000.0000 mg | ORAL_TABLET | Freq: Two times a day (BID) | ORAL | 2 refills | Status: AC
  Filled 2024-07-03 (×2): qty 180, 90d supply, fill #0

## 2024-07-03 MED ORDER — TRIAMCINOLONE ACETONIDE 0.1 % EX CREA
1.0000 | TOPICAL_CREAM | Freq: Two times a day (BID) | CUTANEOUS | 0 refills | Status: AC
  Filled 2024-07-03 – 2024-07-18 (×3): qty 30, 15d supply, fill #0

## 2024-07-03 NOTE — Patient Instructions (Addendum)
 What types of side effects do monoclonal antibody drugs cause?  Common side effects  In general, the more common side effects caused by monoclonal antibody drugs include: Allergic reactions, such as hives or itching Flu-like signs and symptoms, including chills, fatigue, fever, and muscle aches and pains Nausea, vomiting Diarrhea Skin rashes Low blood pressure   The CDC is recommending patients who receive monoclonal antibody treatments wait at least 90 days before being vaccinated.  Currently, there are no data on the safety and efficacy of mRNA COVID-19 vaccines in persons who received monoclonal antibodies or convalescent plasma as part of COVID-19 treatment. Based on the estimated half-life of such therapies as well as evidence suggesting that reinfection is uncommon in the 90 days after initial infection, vaccination should be deferred for at least 90 days, as a precautionary measure until additional information becomes available, to avoid interference of the antibody treatment with vaccine-induced immune responses.     1. Type 2 diabetes mellitus without complication, without long-term current use of insulin  (HCC)  - metFORMIN  (GLUCOPHAGE ) 1000 MG tablet; Take 1 tablet (1,000 mg total) by mouth 2 (two) times daily with a meal.  Dispense: 180 tablet; Refill: 2 - tirzepatide  (MOUNJARO ) 7.5 MG/0.5ML Pen; Inject 7.5 mg into the skin once a week.  Dispense: 2 mL; Refill: 1  2. Itching (Primary)  - triamcinolone  cream (KENALOG ) 0.1 %; Apply 1 Application topically 2 (two) times daily.  Dispense: 30 g; Refill: 0    It is important that you exercise regularly at least 30 minutes 5 times a week as tolerated  Think about what you will eat, plan ahead. Choose  clean, green, fresh or frozen over canned, processed or packaged foods which are more sugary, salty and fatty. 70 to 75% of food eaten should be vegetables and fruit. Three meals at set times with snacks allowed between meals, but  they must be fruit or vegetables. Aim to eat over a 12 hour period , example 7 am to 7 pm, and STOP after  your last meal of the day. Drink water,generally about 64 ounces per day, no other drink is as healthy. Fruit juice is best enjoyed in a healthy way, by EATING the fruit.  Thanks for choosing [facility] we consider it a privelige to serve you.

## 2024-07-03 NOTE — Assessment & Plan Note (Signed)
 Ordered triamcinolone  0.1% cream for itching, apply twice daily for 1 to 2 weeks  No redness, drainage or rashes noted on examination of both breast

## 2024-07-03 NOTE — Progress Notes (Signed)
 Established Patient Office Visit  Subjective:  Patient ID: Lisa [name], female    DOB: [birth year]  Age: 43 y.o. MRN: [id number]  CC: No chief complaint on file.   HPI  Discussed the use of AI scribe software for clinical note transcription with the patient, who gave verbal consent to proceed.  History of Present Illness Lisa [name] is a 43 year old female   has a past medical history of COVID-19, Diabetes mellitus (HCC), Iron  deficiency anemia (06/01/2023), and Morbid obesity (HCC). who presents with breast  itching.She has been experiencing itching around the nipple area of her breast for the past two weeks. There is no associated redness or discharge.  Never had this problem before  Interpretation services provided by medical interpreter      Past Medical History:  Diagnosis Date   COVID-19    Diabetes mellitus (HCC)    Iron  deficiency anemia 06/01/2023   Morbid obesity (HCC)     No past surgical history on file.  Family History  Problem Relation Age of Onset   Diabetes type II Mother    Diabetes Mellitus II Father    Diabetes Maternal Grandmother    Diabetes Maternal Grandfather    Other Neg Hx     Social History   Socioeconomic History   Marital status: Married    Spouse name: Not on file   Number of children: 2   Years of education: Not on file   Highest education level: Not on file  Occupational History   Not on file  Tobacco Use   Smoking status: Never   Smokeless tobacco: Never  Vaping Use   Vaping status: Never Used  Substance and Sexual Activity   Alcohol use: No   Drug use: Never   Sexual activity: Not Currently  Other Topics Concern   Not on file  Social History Narrative   Lives with her husband. He husband lives in [place]    Social Drivers of Health   Financial Resource Strain: Not on file  Food Insecurity: Not on file  Transportation Needs: Not on file  Physical Activity: Not on file  Stress: Not on file  Social  Connections: Not on file  Intimate Partner Violence: Not on file    Outpatient Medications Prior to Visit  Medication Sig Dispense Refill   acetaminophen  (TYLENOL ) 500 MG tablet Take 2 tablets (1,000 mg total) by mouth every 6 (six) hours as needed. 30 tablet 0   atorvastatin  (LIPITOR) 80 MG tablet Take 1 tablet (80 mg total) by mouth daily. 90 tablet 3   glucose blood (ONETOUCH VERIO) test strip Use to check blood sugar 3 times daily. E11.69 100 each 6   ibuprofen  (ADVIL ) 600 MG tablet Take 1 tablet (600 mg total) by mouth every 8 (eight) hours as needed. 60 tablet 1   Insulin  Pen Needle (B-D UF III MINI PEN NEEDLES) 31G X 5 MM MISC Use as instructed. Inject into the skin once nightly. 100 each 6   lidocaine  (LIDODERM ) 5 % Place 1 patch onto the skin daily. Remove & Discard patch within 12 hours or as directed by MD 30 patch 0   Vitamin D , Ergocalciferol , (DRISDOL ) 1.25 MG (50000 UNIT) CAPS capsule Take 1 capsule (50,000 Units total) by mouth every 7 (seven) days. 8 capsule 0   metFORMIN  (GLUCOPHAGE ) 1000 MG tablet Take 1 tablet (1,000 mg total) by mouth 2 (two) times daily with a meal. 180 tablet 2   tirzepatide  (MOUNJARO ) 7.5 MG/0.5ML  Pen Inject 7.5 mg into the skin once a week. 2 mL 1   meloxicam  (MOBIC ) 15 MG tablet Take 1 tablet (15 mg total) by mouth daily. (Patient not taking: Reported on 07/03/2024) 30 tablet 0   No facility-administered medications prior to visit.    Allergies  Allergen Reactions   Jardiance  [Empagliflozin ] Other (See Comments)    HEADACHE/DIZZINESS    ROS Review of Systems  Constitutional:  Negative for appetite change, chills, fatigue and fever.  HENT:  Negative for congestion, postnasal drip, rhinorrhea and sneezing.   Respiratory:  Negative for cough, shortness of breath and wheezing.   Cardiovascular:  Negative for chest pain, palpitations and leg swelling.  Gastrointestinal:  Negative for abdominal pain, constipation, nausea and vomiting.   Genitourinary:  Negative for difficulty urinating, dysuria, flank pain and frequency.  Musculoskeletal:  Negative for arthralgias, back pain, joint swelling and myalgias.  Skin:  Negative for color change, pallor, rash and wound.  Neurological:  Negative for dizziness, facial asymmetry, weakness, numbness and headaches.  Psychiatric/Behavioral:  Negative for behavioral problems, confusion, self-injury and suicidal ideas.       Objective:    Physical Exam Vitals and nursing note reviewed. Exam conducted with a chaperone present.  Constitutional:      General: She is not in acute distress.    Appearance: Normal appearance. She is obese. She is not ill-appearing, toxic-appearing or diaphoretic.  HENT:     Mouth/Throat:     Mouth: Mucous membranes are moist.     Pharynx: Oropharynx is clear. No oropharyngeal exudate or posterior oropharyngeal erythema.  Eyes:     General: No scleral icterus.       Right eye: No discharge.        Left eye: No discharge.     Extraocular Movements: Extraocular movements intact.     Conjunctiva/sclera: Conjunctivae normal.  Cardiovascular:     Rate and Rhythm: Normal rate and regular rhythm.     Pulses: Normal pulses.     Heart sounds: Normal heart sounds. No murmur heard.    No friction rub. No gallop.  Pulmonary:     Effort: Pulmonary effort is normal. No respiratory distress.     Breath sounds: Normal breath sounds. No stridor. No wheezing, rhonchi or rales.  Chest:     Chest wall: No tenderness.  Breasts:    Breasts are symmetrical.     Right: Normal. No nipple discharge, skin change or tenderness.     Left: Normal. No nipple discharge, skin change or tenderness.  Abdominal:     General: There is no distension.     Palpations: Abdomen is soft.     Tenderness: There is no abdominal tenderness. There is no right CVA tenderness, left CVA tenderness or guarding.  Musculoskeletal:        General: No swelling, tenderness, deformity or signs of  injury.     Right lower leg: No edema.     Left lower leg: No edema.  Skin:    General: Skin is warm and dry.     Capillary Refill: Capillary refill takes less than 2 seconds.     Coloration: Skin is not jaundiced or pale.     Findings: No bruising, erythema or lesion.  Neurological:     Mental Status: She is alert and oriented to person, place, and time.     Motor: No weakness.     Coordination: Coordination normal.     Gait: Gait normal.  Psychiatric:  Mood and Affect: Mood normal.        Behavior: Behavior normal.        Thought Content: Thought content normal.        Judgment: Judgment normal.     BP 129/73   Pulse 75   Wt 230 lb (104.3 kg)   SpO2 100%   BMI 37.12 kg/m  Wt Readings from Last 3 Encounters:  07/03/24 230 lb (104.3 kg)  05/08/24 235 lb (106.6 kg)  01/28/24 234 lb (106.1 kg)    Lab Results  Component Value Date   TSH 4.540 (H) 05/08/2024   Lab Results  Component Value Date   WBC 5.3 05/08/2024   HGB 12.0 05/08/2024   HCT 37.7 05/08/2024   MCV 79 05/08/2024   PLT 248 05/08/2024   Lab Results  Component Value Date   NA 139 05/08/2024   K 4.0 05/08/2024   CO2 23 05/08/2024   GLUCOSE 132 (H) 05/08/2024   BUN 11 05/08/2024   CREATININE 0.57 05/08/2024   BILITOT 0.3 12/24/2023   ALKPHOS 71 12/24/2023   AST 19 12/24/2023   ALT 25 12/24/2023   PROT 7.2 12/24/2023   ALBUMIN 4.5 12/24/2023   CALCIUM  9.5 05/08/2024   ANIONGAP 11 01/30/2018   EGFR 116 05/08/2024   Lab Results  Component Value Date   CHOL 174 12/11/2020   Lab Results  Component Value Date   HDL 49 12/11/2020   Lab Results  Component Value Date   LDLCALC 96 12/11/2020   Lab Results  Component Value Date   TRIG 166 (H) 12/11/2020   Lab Results  Component Value Date   CHOLHDL 3.6 12/11/2020   Lab Results  Component Value Date   HGBA1C 7.6 (A) 05/08/2024      Assessment & Plan:   Problem List Items Addressed This Visit       Endocrine   Type 2  diabetes mellitus without complication, without long-term current use of insulin  (HCC)   Metformin  1000 mg twice daily, Mounjaro  7.5 mg once weekly refilled A1c at next visit Lab Results  Component Value Date   HGBA1C 7.6 (A) 05/08/2024         Relevant Medications   metFORMIN  (GLUCOPHAGE ) 1000 MG tablet   tirzepatide  (MOUNJARO ) 7.5 MG/0.5ML Pen     Other   Itching - Primary   Ordered triamcinolone  0.1% cream for itching, apply twice daily for 1 to 2 weeks  No redness, drainage or rashes noted on examination of both breast       Relevant Medications   triamcinolone  cream (KENALOG ) 0.1 %   Screening mammogram for breast cancer   Relevant Orders   MM Digital Screening    Meds ordered this encounter  Medications   metFORMIN  (GLUCOPHAGE ) 1000 MG tablet    Sig: Take 1 tablet (1,000 mg total) by mouth 2 (two) times daily with a meal.    Dispense:  180 tablet    Refill:  2    Pt would like prescription mailed to home   tirzepatide  (MOUNJARO ) 7.5 MG/0.5ML Pen    Sig: Inject 7.5 mg into the skin once a week.    Dispense:  2 mL    Refill:  1    Pt would like prescription mailed to home   triamcinolone  cream (KENALOG ) 0.1 %    Sig: Apply 1 Application topically 2 (two) times daily.    Dispense:  30 g    Refill:  0    Follow-up: Return in  about 6 weeks (around 08/14/2024) for DM.    Ilia Engelbert R Etosha Wetherell, FNP

## 2024-07-03 NOTE — Addendum Note (Signed)
 Addended by: Antoinetta Berrones M on: 07/03/2024 02:34 PM   Modules accepted: Orders

## 2024-07-03 NOTE — Assessment & Plan Note (Signed)
 Metformin  1000 mg twice daily, Mounjaro  7.5 mg once weekly refilled A1c at next visit Lab Results  Component Value Date   HGBA1C 7.6 (A) 05/08/2024

## 2024-07-05 ENCOUNTER — Other Ambulatory Visit: Payer: Self-pay

## 2024-07-10 ENCOUNTER — Other Ambulatory Visit: Payer: Self-pay

## 2024-07-11 ENCOUNTER — Other Ambulatory Visit (HOSPITAL_COMMUNITY): Payer: Self-pay

## 2024-07-11 ENCOUNTER — Other Ambulatory Visit: Payer: Self-pay

## 2024-07-18 ENCOUNTER — Other Ambulatory Visit: Payer: Self-pay

## 2024-07-18 ENCOUNTER — Ambulatory Visit: Attending: Physician Assistant | Admitting: Physical Therapy

## 2024-07-18 ENCOUNTER — Other Ambulatory Visit (HOSPITAL_COMMUNITY): Payer: Self-pay

## 2024-07-18 ENCOUNTER — Encounter: Payer: Self-pay | Admitting: Physical Therapy

## 2024-07-18 DIAGNOSIS — G8929 Other chronic pain: Secondary | ICD-10-CM | POA: Diagnosis present

## 2024-07-18 DIAGNOSIS — M25561 Pain in right knee: Secondary | ICD-10-CM | POA: Diagnosis present

## 2024-07-18 DIAGNOSIS — M5459 Other low back pain: Secondary | ICD-10-CM | POA: Diagnosis present

## 2024-07-18 NOTE — Therapy (Signed)
 OUTPATIENT PHYSICAL THERAPY TREATMENT   Patient Name: Lisa [name] MRN: [id number] DOB:11/15/1980, 43 y.o., female Today's Date: 07/18/2024  END OF SESSION:  PT End of Session - 07/18/24 1628     Visit Number 2    Number of Visits 6    Date for Recertification  08/29/24    Authorization Type [employer]    PT Start Time 1628    PT Stop Time 1710    PT Time Calculation (min) 42 min    Activity Tolerance Patient tolerated treatment well    Behavior During Therapy [facility] for tasks assessed/performed           Past Medical History:  Diagnosis Date   COVID-19    Diabetes mellitus (HCC)    Iron  deficiency anemia 06/01/2023   Morbid obesity (HCC)    History reviewed. No pertinent surgical history. Patient Active Problem List   Diagnosis Date Noted   Itching 07/03/2024   Screening mammogram for breast cancer 07/03/2024   Pain of left heel 05/08/2024   Fatigue 05/08/2024   Blurred vision 05/08/2024   Chronic pain of right knee 12/24/2023   Dyslipidemia, goal LDL below 70 12/24/2023   Vitamin D  deficiency 12/24/2023   Iron  deficiency anemia 06/01/2023   Obesity with serious comorbidity    Diabetes mellitus (HCC)    Type 2 diabetes mellitus without complication, without long-term current use of insulin  (HCC) 09/22/2019   Low back pain 02/28/2019   High-risk pregnancy 08/15/2012   Short cervix, antepartum 08/08/2012   Twin gestation, dichorionic diamniotic 07/18/2012   Gestational diabetes mellitus, antepartum 07/18/2012    PCP: Juanice Thomes SAUNDERS, FNP   REFERRING PROVIDER: Persons, Ronal Dragon, PA  REFERRING DIAG: [phone] (ICD-10-CM) - Chronic pain of right knee G89.29,M54.41 (ICD-10-CM) - Chronic bilateral low back pain with right-sided sciatica  Rationale for Evaluation and Treatment: Rehabilitation  THERAPY DIAG:  Other low back pain  Chronic pain of right knee  ONSET DATE: chronic  SUBJECTIVE:                                                                                                                                                                                            SUBJECTIVE STATEMENT: Pt comes in after having worked 8 hours already today. Feeling a little pain. States she has been doing her exercises as instructed. Reports that pain now comes after 4-5 hours whereas before it would happen around 1 hour.   From eval: HPI patient is a pleasant 43 year old woman who is seen with the help of the interpreter today.  She has a chronic history of right knee pain and right low  back pain.  She does not recall any injury however she does have a job that requires standing on her feet for 8 hours a day.  Denies any fever or chills.  She has treated this with topical medicine and ibuprofen .  No real instability.  No instability in the back no loss of bowel or bladder control pain is on the right posterior back.   Review of Systems  All other systems reviewed and are negative.  PERTINENT HISTORY:  1. Chronic pain of right knee   2. Chronic bilateral low back pain with right-sided sciatica       Plan: With regards to her right knee most of her pain is medially she has no loss of stability no effusion no erythema.  Would recommend a period of physical therapy.  Will return in 4 weeks if PT does not help her much could consider an MRI I do not see significant degenerative changes on her x-ray.  With regards to her back seems sciatic in nature but her strength is intact she does have a straight leg raise.  And pain with flexion and extension.  No paresthesias.  Would again promote physical therapy and anti-inflammatory.  PAIN:  Are you having pain? Yes: NPRS scale: 5/10 Pain location: Low back and R knee Pain description: ache Aggravating factors: prolonged standing Relieving factors: lying down and meds  PRECAUTIONS: None  RED FLAGS: None   WEIGHT BEARING RESTRICTIONS: No  FALLS:  Has patient fallen in last 6 months?  No  OCCUPATION: Retail(standing)  PLOF: Independent  PATIENT GOALS: To manage my symptoms  NEXT MD VISIT: 4 weeks  OBJECTIVE:  Note: Objective measures were completed at Evaluation unless otherwise noted.  DIAGNOSTIC FINDINGS:  IMPRESSION: No acute fracture or dislocation. Mild curvature of spine. No significant degenerative joint changes.     Electronically Signed   By: Craig Farr M.D.   On: 04/23/2023 11:35  PATIENT SURVEYS:  ODI 15/50  MUSCLE LENGTH: Hamstrings: Right 70 deg; Left 70 deg   POSTURE: rounded shoulders  PALPATION: deferred  LUMBAR ROM:   AROM eval  Flexion WNL  Extension WNL  Right lateral flexion   Left lateral flexion   Right rotation WNL  Left rotation WNL   (Blank rows = not tested)  LOWER EXTREMITY ROM:   WFL for gait and transfers  Active  Right eval Left eval  Hip flexion    Hip extension    Hip abduction    Hip adduction    Hip internal rotation    Hip external rotation    Knee flexion    Knee extension    Ankle dorsiflexion    Ankle plantarflexion    Ankle inversion    Ankle eversion     (Blank rows = not tested)  LOWER EXTREMITY MMT:  see 30s chair stand test  MMT Right eval Left eval  Hip flexion    Hip extension    Hip abduction    Hip adduction    Hip internal rotation    Hip external rotation    Knee flexion    Knee extension    Ankle dorsiflexion    Ankle plantarflexion    Ankle inversion    Ankle eversion     (Blank rows = not tested)  LUMBAR SPECIAL TESTS:  Straight leg raise test: Negative and Slump test: symptomatic R  FUNCTIONAL TESTS:  30 seconds chair stand test 8 reps arms crossed   GAIT: Distance walked: 1ftx2 Assistive device utilized:  None Level of assistance: Complete Independence Comments: unremarkable  TREATMENT:                                                                                                                            [facility] Adult PT Treatment:                                                 DATE: 07/18/24 Nustep L6 x 10 min UEs/LEs Sitting hamstring stretch x30 Sitting figure 4 stretch 2x30 Sitting sciatic nerve tensioner x10 Supine LTR x10 Supine 90/90 abdominal bracing 2x30 Supine posterior pelvic tilt x10 Supine SLR with abdominal bracing 2x10 Supine bridge 2x10 Supine clamshell red TB isometrics 2x10 Prone quad stretch x 30 with strap Prone hamstring curl red TB 2x10 Sit<>stand x5  [facility] Adult PT Treatment:                                                DATE: 06/29/24 Eval and HEP Self Care: Additional minutes spent for educating on updated Therapeutic Home Exercise Program as well as comparing current status to condition at start of symptoms. This included exercises focusing on stretching, strengthening, with focus on eccentric aspects. Long term goals include an improvement in range of motion, strength, endurance as well as avoiding reinjury. Patient's frequency would include in 1-2 times a day, 3-5 times a week for a duration of 6-12 weeks. Proper technique shown and discussed handout in great detail. All questions were discussed and addressed.      PATIENT EDUCATION:  Education details: Discussed eval findings, rehab rationale and POC and patient is in agreement  Person educated: Patient Education method: Explanation and Handouts Education comprehension: verbalized understanding and needs further education  HOME EXERCISE PROGRAM: Access Code: [email or web address] URL: [email or web address] Date: 07/18/2024 Prepared by: Dianelly Ferran April Earnie Starring  Exercises - Supine 90/90 Abdominal Bracing  - 2 x daily - 5 x weekly - 1 sets - 2 reps - 30s hold - Seated Sciatic Tensioner  - 2 x daily - 5 x weekly - 2 sets - 10 reps - Sit to Stand with Arms Crossed  - 2 x daily - 5 x weekly - 1 sets - 5 reps - Supine Bridge  - 2 x daily - 7 x weekly - 2 sets - 10 reps - Hooklying Isometric Clamshell  - 1 x daily - 7 x weekly - 2 sets - 10  reps - Supine Pelvic Tilt with Straight Leg Raise  - 1 x daily - 7 x weekly - 2 sets - 10 reps  ASSESSMENT:  CLINICAL IMPRESSION: Reviewed HEP with pt demonstrating good understanding. Has had a good response to her current HEP. Worked  on progressive core strengthening. Required cueing for TSA contraction and posterior pelvic tilt form.   From eval: Patient is a 43 y.o. female who was seen today for physical therapy evaluation and treatment for chronic low back and R knee pain.  R slump test elicits RLE symptoms which improve with repetition, R SLR negative.  Trunk and LE ROM WNL but LE and core strength deficits noted with 90/90 position hold and 30s chair stand test.  Patient is a good PT candidate to impreove LE and core strength and promote sciatic nerve mobility.    OBJECTIVE IMPAIRMENTS: decreased activity tolerance, decreased endurance, decreased knowledge of condition, decreased mobility, improper body mechanics, obesity, and pain.   ACTIVITY LIMITATIONS: carrying, lifting, bending, and standing  PERSONAL FACTORS: Age, Behavior pattern, and Fitness are also affecting patient's functional outcome.   REHAB POTENTIAL: Fair due to chronicity, body habitus and language barrier  CLINICAL DECISION MAKING: Stable/uncomplicated  EVALUATION COMPLEXITY: Moderate   GOALS: Goals reviewed with patient? No   SHORT TERM GOALS=LONG TERM GOALS: Target date: 08/10/2024    Patient will increase 30s chair stand reps from 8 to 10 without arms to demonstrate and improved functional ability with less pain/difficulty as well as reduce fall risk.  Baseline: 8 Goal status: INITIAL  2.  Patient will acknowledge 6/10 pain at least once during episode of care   Baseline: 10/10 Goal status: INITIAL  3.  Patient will score at least 8/50 on ODI to signify clinically meaningful improvement in functional abilities.   Baseline: 15/50 Goal status: INITIAL  4.  Patient to demonstrate independence in HEP   Baseline: [id number] Goal status: INITIAL  5.  Negative R slump test Baseline: + R slump Goal status: INITIAL    PLAN:  PT FREQUENCY: 1x/week  PT DURATION: 6 weeks  PLANNED INTERVENTIONS: 97110-Therapeutic exercises, 97530- Therapeutic activity, [id number]- Neuromuscular re-education, 97535- Self Care, [id number]- Manual therapy, and Patient/Family education.  PLAN FOR NEXT SESSION: HEP review and update, manual techniques as appropriate, aerobic tasks, ROM and flexibility activities, strengthening and PREs, TPDN, gait and balance training,aquatic therapy, modalities for pain and NMRE    For all possible CPT codes, reference the Planned Interventions line above.     Check all conditions that are expected to impact treatment: {Conditions expected to impact treatment:Diabetes mellitus   If treatment provided at initial evaluation, no treatment charged due to lack of authorization.       Sherisa Gilvin April Ma L Curlee Bogan, PT 07/18/2024, 4:29 PM

## 2024-07-25 ENCOUNTER — Ambulatory Visit

## 2024-07-25 DIAGNOSIS — M5459 Other low back pain: Secondary | ICD-10-CM | POA: Diagnosis not present

## 2024-07-25 DIAGNOSIS — G8929 Other chronic pain: Secondary | ICD-10-CM

## 2024-07-25 NOTE — Therapy (Signed)
 OUTPATIENT PHYSICAL THERAPY TREATMENT   Patient Name: Lisa [name] MRN: [id number] DOB:Dec 08, 1980, 43 y.o., female Today's Date: 07/25/2024  END OF SESSION:  PT End of Session - 07/25/24 1752     Visit Number 3    Number of Visits 6    Date for Recertification  08/29/24    Authorization Type [employer]    PT Start Time 1745    PT Stop Time 1823    PT Time Calculation (min) 38 min    Activity Tolerance Patient tolerated treatment well    Behavior During Therapy [facility] for tasks assessed/performed            Past Medical History:  Diagnosis Date   COVID-19    Diabetes mellitus (HCC)    Iron  deficiency anemia 06/01/2023   Morbid obesity (HCC)    No past surgical history on file. Patient Active Problem List   Diagnosis Date Noted   Itching 07/03/2024   Screening mammogram for breast cancer 07/03/2024   Pain of left heel 05/08/2024   Fatigue 05/08/2024   Blurred vision 05/08/2024   Chronic pain of right knee 12/24/2023   Dyslipidemia, goal LDL below 70 12/24/2023   Vitamin D  deficiency 12/24/2023   Iron  deficiency anemia 06/01/2023   Obesity with serious comorbidity    Diabetes mellitus (HCC)    Type 2 diabetes mellitus without complication, without long-term current use of insulin  (HCC) 09/22/2019   Low back pain 02/28/2019   High-risk pregnancy 08/15/2012   Short cervix, antepartum 08/08/2012   Twin gestation, dichorionic diamniotic 07/18/2012   Gestational diabetes mellitus, antepartum 07/18/2012    PCP: Juanice Thomes SAUNDERS, FNP   REFERRING PROVIDER: Persons, Ronal Dragon, PA  REFERRING DIAG: [phone] (ICD-10-CM) - Chronic pain of right knee G89.29,M54.41 (ICD-10-CM) - Chronic bilateral low back pain with right-sided sciatica  Rationale for Evaluation and Treatment: Rehabilitation  THERAPY DIAG:  Other low back pain  Chronic pain of right knee  ONSET DATE: chronic  SUBJECTIVE:                                                                                                                                                                                            SUBJECTIVE STATEMENT: Patient has worked her 8 hours today. Continues to have pain with work and pain with frequent lifting. She states that her pain is a little lower at this time, d/t taking ibuprofen .   From eval: HPI patient is a pleasant 43 year old woman who is seen with the help of the interpreter today.  She has a chronic history of right knee pain and right low back pain.  She does  not recall any injury however she does have a job that requires standing on her feet for 8 hours a day.  Denies any fever or chills.  She has treated this with topical medicine and ibuprofen .  No real instability.  No instability in the back no loss of bowel or bladder control pain is on the right posterior back.   Review of Systems  All other systems reviewed and are negative.  PERTINENT HISTORY:  1. Chronic pain of right knee   2. Chronic bilateral low back pain with right-sided sciatica       Plan: With regards to her right knee most of her pain is medially she has no loss of stability no effusion no erythema.  Would recommend a period of physical therapy.  Will return in 4 weeks if PT does not help her much could consider an MRI I do not see significant degenerative changes on her x-ray.  With regards to her back seems sciatic in nature but her strength is intact she does have a straight leg raise.  And pain with flexion and extension.  No paresthesias.  Would again promote physical therapy and anti-inflammatory.  PAIN:  Are you having pain? Yes: NPRS scale: 5/10 Pain location: Low back and R knee Pain description: ache Aggravating factors: prolonged standing Relieving factors: lying down and meds  PRECAUTIONS: None  RED FLAGS: None   WEIGHT BEARING RESTRICTIONS: No  FALLS:  Has patient fallen in last 6 months? No  OCCUPATION: Retail(standing)  PLOF:  Independent  PATIENT GOALS: To manage my symptoms  NEXT MD VISIT: 4 weeks  OBJECTIVE:  Note: Objective measures were completed at Evaluation unless otherwise noted.  DIAGNOSTIC FINDINGS:  IMPRESSION: No acute fracture or dislocation. Mild curvature of spine. No significant degenerative joint changes.     Electronically Signed   By: Craig Farr M.D.   On: 04/23/2023 11:35  PATIENT SURVEYS:  ODI 15/50  MUSCLE LENGTH: Hamstrings: Right 70 deg; Left 70 deg   POSTURE: rounded shoulders  PALPATION: deferred  LUMBAR ROM:   AROM eval  Flexion WNL  Extension WNL  Right lateral flexion   Left lateral flexion   Right rotation WNL  Left rotation WNL   (Blank rows = not tested)  LOWER EXTREMITY ROM:   WFL for gait and transfers  Active  Right eval Left eval  Hip flexion    Hip extension    Hip abduction    Hip adduction    Hip internal rotation    Hip external rotation    Knee flexion    Knee extension    Ankle dorsiflexion    Ankle plantarflexion    Ankle inversion    Ankle eversion     (Blank rows = not tested)  LOWER EXTREMITY MMT:  see 30s chair stand test  MMT Right eval Left eval  Hip flexion    Hip extension    Hip abduction    Hip adduction    Hip internal rotation    Hip external rotation    Knee flexion    Knee extension    Ankle dorsiflexion    Ankle plantarflexion    Ankle inversion    Ankle eversion     (Blank rows = not tested)  LUMBAR SPECIAL TESTS:  Straight leg raise test: Negative and Slump test: symptomatic R  FUNCTIONAL TESTS:  30 seconds chair stand test 8 reps arms crossed   GAIT: Distance walked: 49ftx2 Assistive device utilized: None Level of assistance: Complete  Independence Comments: unremarkable  TREATMENT:                                                                                                                            [facility] Adult PT Treatment:                                                DATE:  07/25/24 Nustep L6 x 10 min UEs/LEs Supine LTR x10 Supine posterior pelvic tilt 2x10 Supine 90/90 abdominal bracing 2x30 Supine SLR with abdominal bracing 2x10 Supine bridge 2x10 Supine clamshell red TB isometrics 2x10 Sit<>stand x8 Sitting hamstring stretch x30 Sitting sciatic nerve tensioner x10  Did not perform today: Sitting figure 4 stretch 2x30 Prone quad stretch x 30 with strap Prone hamstring curl red TB 2x10   PATIENT EDUCATION:  Education details: Discussed eval findings, rehab rationale and POC and patient is in agreement  Person educated: Patient Education method: Explanation and Handouts Education comprehension: verbalized understanding and needs further education  HOME EXERCISE PROGRAM: Access Code: [email or web address] URL: [email or web address] Date: 07/18/2024 Prepared by: Gellen April Earnie Starring  Exercises - Supine 90/90 Abdominal Bracing  - 2 x daily - 5 x weekly - 1 sets - 2 reps - 30s hold - Seated Sciatic Tensioner  - 2 x daily - 5 x weekly - 2 sets - 10 reps - Sit to Stand with Arms Crossed  - 2 x daily - 5 x weekly - 1 sets - 5 reps - Supine Bridge  - 2 x daily - 7 x weekly - 2 sets - 10 reps - Hooklying Isometric Clamshell  - 1 x daily - 7 x weekly - 2 sets - 10 reps - Supine Pelvic Tilt with Straight Leg Raise  - 1 x daily - 7 x weekly - 2 sets - 10 reps  ASSESSMENT:  CLINICAL IMPRESSION: Patient continues to respond well to current POC. Required some verbal and tactile cures for improved mechanics with core strengthening activities. We will continue to progress as tolerated.   From eval: Patient is a 43 y.o. female who was seen today for physical therapy evaluation and treatment for chronic low back and R knee pain.  R slump test elicits RLE symptoms which improve with repetition, R SLR negative.  Trunk and LE ROM WNL but LE and core strength deficits noted with 90/90 position hold and 30s chair stand test.  Patient is a good PT candidate  to impreove LE and core strength and promote sciatic nerve mobility.    OBJECTIVE IMPAIRMENTS: decreased activity tolerance, decreased endurance, decreased knowledge of condition, decreased mobility, improper body mechanics, obesity, and pain.   ACTIVITY LIMITATIONS: carrying, lifting, bending, and standing  PERSONAL FACTORS: Age, Behavior pattern, and Fitness are also affecting patient's functional outcome.   REHAB POTENTIAL: Fair due to chronicity, body habitus  and language barrier  CLINICAL DECISION MAKING: Stable/uncomplicated  EVALUATION COMPLEXITY: Moderate   GOALS: Goals reviewed with patient? No   SHORT TERM GOALS=LONG TERM GOALS: Target date: 08/10/2024    Patient will increase 30s chair stand reps from 8 to 10 without arms to demonstrate and improved functional ability with less pain/difficulty as well as reduce fall risk.  Baseline: 8 Goal status: INITIAL  2.  Patient will acknowledge 6/10 pain at least once during episode of care   Baseline: 10/10 Goal status: INITIAL  3.  Patient will score at least 8/50 on ODI to signify clinically meaningful improvement in functional abilities.   Baseline: 15/50 Goal status: INITIAL  4.  Patient to demonstrate independence in HEP  Baseline: 27A2QB2T Goal status: INITIAL  5.  Negative R slump test Baseline: + R slump Goal status: INITIAL    PLAN:  PT FREQUENCY: 1x/week  PT DURATION: 6 weeks  PLANNED INTERVENTIONS: 97110-Therapeutic exercises, 97530- Therapeutic activity, [id number]- Neuromuscular re-education, 97535- Self Care, 02859- Manual therapy, and Patient/Family education.  PLAN FOR NEXT SESSION: HEP review and update, manual techniques as appropriate, aerobic tasks, ROM and flexibility activities, strengthening and PREs, TPDN, gait and balance training,aquatic therapy, modalities for pain and NMRE    For all possible CPT codes, reference the Planned Interventions line above.     Check all conditions that are  expected to impact treatment: {Conditions expected to impact treatment:Diabetes mellitus   If treatment provided at initial evaluation, no treatment charged due to lack of authorization.       Marko Molt, PT 07/25/2024, 6:27 PM

## 2024-08-01 ENCOUNTER — Telehealth: Payer: Self-pay

## 2024-08-01 ENCOUNTER — Ambulatory Visit: Admitting: Physician Assistant

## 2024-08-01 ENCOUNTER — Ambulatory Visit

## 2024-08-01 DIAGNOSIS — M5459 Other low back pain: Secondary | ICD-10-CM | POA: Insufficient documentation

## 2024-08-01 DIAGNOSIS — G8929 Other chronic pain: Secondary | ICD-10-CM | POA: Insufficient documentation

## 2024-08-01 DIAGNOSIS — M25561 Pain in right knee: Secondary | ICD-10-CM | POA: Insufficient documentation

## 2024-08-01 NOTE — Telephone Encounter (Signed)
 Spoke with patient via [employer] interpreter [phone]. Patient states she forgot about today's appointment, thinking she wasn't scheduled this week. She confirms that she will be here for next scheduled appt. - Lisa [name]

## 2024-08-08 ENCOUNTER — Ambulatory Visit

## 2024-08-08 DIAGNOSIS — M5459 Other low back pain: Secondary | ICD-10-CM

## 2024-08-08 DIAGNOSIS — G8929 Other chronic pain: Secondary | ICD-10-CM

## 2024-08-08 DIAGNOSIS — M25561 Pain in right knee: Secondary | ICD-10-CM | POA: Diagnosis present

## 2024-08-08 NOTE — Therapy (Signed)
 OUTPATIENT PHYSICAL THERAPY TREATMENT   Patient Name: Lisa [name] MRN: [id number] DOB:1981/04/28, 43 y.o., female Today's Date: 08/08/2024  END OF SESSION:      Past Medical History:  Diagnosis Date   COVID-19    Diabetes mellitus (HCC)    Iron  deficiency anemia 06/01/2023   Morbid obesity (HCC)    No past surgical history on file. Patient Active Problem List   Diagnosis Date Noted   Itching 07/03/2024   Screening mammogram for breast cancer 07/03/2024   Pain of left heel 05/08/2024   Fatigue 05/08/2024   Blurred vision 05/08/2024   Chronic pain of right knee 12/24/2023   Dyslipidemia, goal LDL below 70 12/24/2023   Vitamin D  deficiency 12/24/2023   Iron  deficiency anemia 06/01/2023   Obesity with serious comorbidity    Diabetes mellitus (HCC)    Type 2 diabetes mellitus without complication, without long-term current use of insulin  (HCC) 09/22/2019   Low back pain 02/28/2019   High-risk pregnancy 08/15/2012   Short cervix, antepartum 08/08/2012   Twin gestation, dichorionic diamniotic 07/18/2012   Gestational diabetes mellitus, antepartum 07/18/2012    PCP: Juanice Thomes SAUNDERS, FNP   REFERRING PROVIDER: Persons, Ronal Dragon, PA  REFERRING DIAG: [phone] (ICD-10-CM) - Chronic pain of right knee G89.29,M54.41 (ICD-10-CM) - Chronic bilateral low back pain with right-sided sciatica  Rationale for Evaluation and Treatment: Rehabilitation  THERAPY DIAG:  Other low back pain  Chronic pain of right knee  ONSET DATE: chronic  SUBJECTIVE:                                                                                                                                                                                           SUBJECTIVE STATEMENT: Assisted by [employer] Interpreter [phone]  Patient reports that her pain has been a bit worse lately.  From eval: HPI patient is a pleasant 43 year old woman who is seen with the help of the interpreter today.  She  has a chronic history of right knee pain and right low back pain.  She does not recall any injury however she does have a job that requires standing on her feet for 8 hours a day.  Denies any fever or chills.  She has treated this with topical medicine and ibuprofen .  No real instability.  No instability in the back no loss of bowel or bladder control pain is on the right posterior back.   Review of Systems  All other systems reviewed and are negative.  PERTINENT HISTORY:  1. Chronic pain of right knee   2. Chronic bilateral low back pain with right-sided sciatica  Plan: With regards to her right knee most of her pain is medially she has no loss of stability no effusion no erythema.  Would recommend a period of physical therapy.  Will return in 4 weeks if PT does not help her much could consider an MRI I do not see significant degenerative changes on her x-ray.  With regards to her back seems sciatic in nature but her strength is intact she does have a straight leg raise.  And pain with flexion and extension.  No paresthesias.  Would again promote physical therapy and anti-inflammatory.  PAIN:  Are you having pain? Yes: NPRS scale: 5/10 Pain location: Low back and R knee Pain description: ache Aggravating factors: prolonged standing Relieving factors: lying down and meds  PRECAUTIONS: None  RED FLAGS: None   WEIGHT BEARING RESTRICTIONS: No  FALLS:  Has patient fallen in last 6 months? No  OCCUPATION: Retail(standing)  PLOF: Independent  PATIENT GOALS: To manage my symptoms  NEXT MD VISIT: 4 weeks  OBJECTIVE:  Note: Objective measures were completed at Evaluation unless otherwise noted.  DIAGNOSTIC FINDINGS:  IMPRESSION: No acute fracture or dislocation. Mild curvature of spine. No significant degenerative joint changes.     Electronically Signed   By: Craig Farr M.D.   On: 04/23/2023 11:35  PATIENT SURVEYS:  ODI 15/50  MUSCLE LENGTH: Hamstrings: Right 70  deg; Left 70 deg   POSTURE: rounded shoulders  PALPATION: deferred  LUMBAR ROM:   AROM eval  Flexion WNL  Extension WNL  Right lateral flexion   Left lateral flexion   Right rotation WNL  Left rotation WNL   (Blank rows = not tested)  LOWER EXTREMITY ROM:   WFL for gait and transfers  Active  Right eval Left eval  Hip flexion    Hip extension    Hip abduction    Hip adduction    Hip internal rotation    Hip external rotation    Knee flexion    Knee extension    Ankle dorsiflexion    Ankle plantarflexion    Ankle inversion    Ankle eversion     (Blank rows = not tested)  LOWER EXTREMITY MMT:  see 30s chair stand test  MMT Right eval Left eval  Hip flexion    Hip extension    Hip abduction    Hip adduction    Hip internal rotation    Hip external rotation    Knee flexion    Knee extension    Ankle dorsiflexion    Ankle plantarflexion    Ankle inversion    Ankle eversion     (Blank rows = not tested)  LUMBAR SPECIAL TESTS:  Straight leg raise test: Negative and Slump test: symptomatic R  FUNCTIONAL TESTS:  30 seconds chair stand test 8 reps arms crossed   GAIT: Distance walked: 43ftx2 Assistive device utilized: None Level of assistance: Complete Independence Comments: unremarkable  TREATMENT:  [facility] Adult PT Treatment:                                                DATE: 08/08/2024  Nustep L6 x 10 min UEs/LEs Supine LTR x10 Supine 90/90 abdominal bracing 3x15 90/90 marches up up, down down  Supine SLR with abdominal bracing x15 each Supine bridge 2x10 Supine clamshell/knee fall out red TB 2x10 Sit<>stand x8 Sitting hamstring stretch x30 Sitting sciatic nerve tensioner x10  Did not perform today: Sitting figure 4 stretch 2x30 Prone quad stretch x 30 with strap Prone hamstring curl red TB 2x10   PATIENT  EDUCATION:  Education details: Discussed eval findings, rehab rationale and POC and patient is in agreement  Person educated: Patient Education method: Explanation and Handouts Education comprehension: verbalized understanding and needs further education  HOME EXERCISE PROGRAM: Access Code: [email or web address] URL: [email or web address] Date: 07/18/2024 Prepared by: Gellen April Earnie Starring  Exercises - Supine 90/90 Abdominal Bracing  - 2 x daily - 5 x weekly - 1 sets - 2 reps - 30s hold - Seated Sciatic Tensioner  - 2 x daily - 5 x weekly - 2 sets - 10 reps - Sit to Stand with Arms Crossed  - 2 x daily - 5 x weekly - 1 sets - 5 reps - Supine Bridge  - 2 x daily - 7 x weekly - 2 sets - 10 reps - Hooklying Isometric Clamshell  - 1 x daily - 7 x weekly - 2 sets - 10 reps - Supine Pelvic Tilt with Straight Leg Raise  - 1 x daily - 7 x weekly - 2 sets - 10 reps  ASSESSMENT:  CLINICAL IMPRESSION: Patient continues to respond well to current POC. Required some verbal and tactile cures for improved mechanics with core strengthening activities. We will continue to progress as tolerated.   From eval: Patient is a 43 y.o. female who was seen today for physical therapy evaluation and treatment for chronic low back and R knee pain.  R slump test elicits RLE symptoms which improve with repetition, R SLR negative.  Trunk and LE ROM WNL but LE and core strength deficits noted with 90/90 position hold and 30s chair stand test.  Patient is a good PT candidate to impreove LE and core strength and promote sciatic nerve mobility.    OBJECTIVE IMPAIRMENTS: decreased activity tolerance, decreased endurance, decreased knowledge of condition, decreased mobility, improper body mechanics, obesity, and pain.   ACTIVITY LIMITATIONS: carrying, lifting, bending, and standing  PERSONAL FACTORS: Age, Behavior pattern, and Fitness are also affecting patient's functional outcome.   REHAB POTENTIAL: Fair due to  chronicity, body habitus and language barrier  CLINICAL DECISION MAKING: Stable/uncomplicated  EVALUATION COMPLEXITY: Moderate   GOALS: Goals reviewed with patient? No   SHORT TERM GOALS=LONG TERM GOALS: Target date: 08/10/2024    Patient will increase 30s chair stand reps from 8 to 10 without arms to demonstrate and improved functional ability with less pain/difficulty as well as reduce fall risk.  Baseline: 8 Goal status: INITIAL  2.  Patient will acknowledge 6/10 pain at least once during episode of care   Baseline: 10/10 Goal status: INITIAL  3.  Patient will score at least 8/50 on ODI to signify clinically meaningful improvement in functional abilities.   Baseline: 15/50 Goal status: INITIAL  4.  Patient to demonstrate independence in  HEP  Baseline: 27A2QB2T Goal status: INITIAL  5.  Negative R slump test Baseline: + R slump Goal status: INITIAL    PLAN:  PT FREQUENCY: 1x/week  PT DURATION: 6 weeks  PLANNED INTERVENTIONS: 97110-Therapeutic exercises, 97530- Therapeutic activity, [id number]- Neuromuscular re-education, 97535- Self Care, 02859- Manual therapy, and Patient/Family education.  PLAN FOR NEXT SESSION: HEP review and update, manual techniques as appropriate, aerobic tasks, ROM and flexibility activities, strengthening and PREs, TPDN, gait and balance training,aquatic therapy, modalities for pain and NMRE    For all possible CPT codes, reference the Planned Interventions line above.     Check all conditions that are expected to impact treatment: {Conditions expected to impact treatment:Diabetes mellitus      Marko Molt, PT, DPT  08/13/2024 12:05 PM

## 2024-08-10 ENCOUNTER — Other Ambulatory Visit: Payer: Self-pay | Admitting: Surgical

## 2024-08-10 NOTE — Telephone Encounter (Signed)
[place] patient

## 2024-09-04 ENCOUNTER — Telehealth: Payer: Self-pay

## 2024-09-04 ENCOUNTER — Ambulatory Visit: Attending: Physician Assistant

## 2024-09-04 DIAGNOSIS — G8929 Other chronic pain: Secondary | ICD-10-CM | POA: Insufficient documentation

## 2024-09-04 DIAGNOSIS — M5459 Other low back pain: Secondary | ICD-10-CM | POA: Insufficient documentation

## 2024-09-04 DIAGNOSIS — M25561 Pain in right knee: Secondary | ICD-10-CM | POA: Insufficient documentation

## 2024-09-04 NOTE — Telephone Encounter (Signed)
 Called patient via [employer] interpreter [phone]. Patient states that her car broke down. She was able to reschedule today's appt. - MJ

## 2024-09-04 NOTE — Therapy (Incomplete)
 " OUTPATIENT PHYSICAL THERAPY NOTE RECERTIFICATION   Patient Name: Lisa [name] MRN: [id number] DOB:06-16-1981, 44 y.o., female Today's Date: 09/04/2024  END OF SESSION:      Past Medical History:  Diagnosis Date   COVID-19    Diabetes mellitus (HCC)    Iron  deficiency anemia 06/01/2023   Morbid obesity (HCC)    No past surgical history on file. Patient Active Problem List   Diagnosis Date Noted   Itching 07/03/2024   Screening mammogram for breast cancer 07/03/2024   Pain of left heel 05/08/2024   Fatigue 05/08/2024   Blurred vision 05/08/2024   Chronic pain of right knee 12/24/2023   Dyslipidemia, goal LDL below 70 12/24/2023   Vitamin D  deficiency 12/24/2023   Iron  deficiency anemia 06/01/2023   Obesity with serious comorbidity    Diabetes mellitus (HCC)    Type 2 diabetes mellitus without complication, without long-term current use of insulin  (HCC) 09/22/2019   Low back pain 02/28/2019   High-risk pregnancy 08/15/2012   Short cervix, antepartum 08/08/2012   Twin gestation, dichorionic diamniotic 07/18/2012   Gestational diabetes mellitus, antepartum 07/18/2012    PCP: Paseda, Folashade R, FNP   REFERRING PROVIDER: Persons, Ronal Dragon, PA  REFERRING DIAG: [phone] (ICD-10-CM) - Chronic pain of right knee G89.29,M54.41 (ICD-10-CM) - Chronic bilateral low back pain with right-sided sciatica  Rationale for Evaluation and Treatment: Rehabilitation  THERAPY DIAG:  No diagnosis found.  ONSET DATE: chronic  SUBJECTIVE:                                                                                                                                                                                           SUBJECTIVE STATEMENT: Assisted by [employer] Interpreter [phone]  ***  From eval: HPI patient is a pleasant 44 year old woman who is seen with the help of the interpreter today.  She has a chronic history of right knee pain and right low back pain.  She  does not recall any injury however she does have a job that requires standing on her feet for 8 hours a day.  Denies any fever or chills.  She has treated this with topical medicine and ibuprofen .  No real instability.  No instability in the back no loss of bowel or bladder control pain is on the right posterior back.   Review of Systems  All other systems reviewed and are negative.  PERTINENT HISTORY:  1. Chronic pain of right knee   2. Chronic bilateral low back pain with right-sided sciatica       Plan: With regards to her right knee most of her pain is medially she has  no loss of stability no effusion no erythema.  Would recommend a period of physical therapy.  Will return in 4 weeks if PT does not help her much could consider an MRI I do not see significant degenerative changes on her x-ray.  With regards to her back seems sciatic in nature but her strength is intact she does have a straight leg raise.  And pain with flexion and extension.  No paresthesias.  Would again promote physical therapy and anti-inflammatory.  PAIN:  Are you having pain? Yes: NPRS scale: 5/10 Pain location: Low back and R knee Pain description: ache Aggravating factors: prolonged standing Relieving factors: lying down and meds  PRECAUTIONS: None  RED FLAGS: None   WEIGHT BEARING RESTRICTIONS: No  FALLS:  Has patient fallen in last 6 months? No  OCCUPATION: Retail(standing)  PLOF: Independent  PATIENT GOALS: To manage my symptoms  NEXT MD VISIT: 4 weeks  OBJECTIVE:  Note: Objective measures were completed at Evaluation unless otherwise noted.  DIAGNOSTIC FINDINGS:  IMPRESSION: No acute fracture or dislocation. Mild curvature of spine. No significant degenerative joint changes.     Electronically Signed   By: Craig Farr M.D.   On: 04/23/2023 11:35  PATIENT SURVEYS:  ODI 15/50  MUSCLE LENGTH: Hamstrings: Right 70 deg; Left 70 deg   POSTURE: rounded  shoulders  PALPATION: deferred  LUMBAR ROM:   AROM eval  Flexion WNL  Extension WNL  Right lateral flexion   Left lateral flexion   Right rotation WNL  Left rotation WNL   (Blank rows = not tested)  LOWER EXTREMITY ROM:   WFL for gait and transfers  Active  Right eval Left eval  Hip flexion    Hip extension    Hip abduction    Hip adduction    Hip internal rotation    Hip external rotation    Knee flexion    Knee extension    Ankle dorsiflexion    Ankle plantarflexion    Ankle inversion    Ankle eversion     (Blank rows = not tested)  LOWER EXTREMITY MMT:  see 30s chair stand test  MMT Right eval Left eval  Hip flexion    Hip extension    Hip abduction    Hip adduction    Hip internal rotation    Hip external rotation    Knee flexion    Knee extension    Ankle dorsiflexion    Ankle plantarflexion    Ankle inversion    Ankle eversion     (Blank rows = not tested)  LUMBAR SPECIAL TESTS:  Straight leg raise test: Negative and Slump test: symptomatic R  FUNCTIONAL TESTS:  30 seconds chair stand test 8 reps arms crossed   GAIT: Distance walked: 36ftx2 Assistive device utilized: None Level of assistance: Complete Independence Comments: unremarkable  TREATMENT:         [facility] Adult PT Treatment:                                                DATE: 09/04/2024  Therapeutic Activity:  Reassessment of objective measures and subjective assessment regarding progress towards established goals and updated plan for addressing remaining deficits and rehab goals.    Nustep L6 x 10 min UEs/LEs Supine LTR x10 Supine 90/90 abdominal bracing 3x15 90/90 marches up up, down down  Supine SLR with abdominal bracing x15 each Supine bridge 2x10 Supine clamshell/knee fall out red TB 2x10 Sit<>stand x8 Sitting hamstring stretch x30 Sitting sciatic nerve tensioner x10  Did not perform today: Sitting figure 4 stretch 2x30 Prone quad stretch x 30 with  strap Prone hamstring curl red TB 2x10                                                                                                                      [facility] Adult PT Treatment:                                                DATE: 08/08/2024  Nustep L6 x 10 min UEs/LEs Supine LTR x10 Supine 90/90 abdominal bracing 3x15 90/90 marches up up, down down  Supine SLR with abdominal bracing x15 each Supine bridge 2x10 Supine clamshell/knee fall out red TB 2x10 Sit<>stand x8 Sitting hamstring stretch x30 Sitting sciatic nerve tensioner x10  Did not perform today: Sitting figure 4 stretch 2x30 Prone quad stretch x 30 with strap Prone hamstring curl red TB 2x10   PATIENT EDUCATION:  Education details: Discussed eval findings, rehab rationale and POC and patient is in agreement  Person educated: Patient Education method: Explanation and Handouts Education comprehension: verbalized understanding and needs further education  HOME EXERCISE PROGRAM: Access Code: [email or web address] URL: [email or web address] Date: 07/18/2024 Prepared by: Gellen April Earnie Starring  Exercises - Supine 90/90 Abdominal Bracing  - 2 x daily - 5 x weekly - 1 sets - 2 reps - 30s hold - Seated Sciatic Tensioner  - 2 x daily - 5 x weekly - 2 sets - 10 reps - Sit to Stand with Arms Crossed  - 2 x daily - 5 x weekly - 1 sets - 5 reps - Supine Bridge  - 2 x daily - 7 x weekly - 2 sets - 10 reps - Hooklying Isometric Clamshell  - 1 x daily - 7 x weekly - 2 sets - 10 reps - Supine Pelvic Tilt with Straight Leg Raise  - 1 x daily - 7 x weekly - 2 sets - 10 reps  ASSESSMENT:  CLINICAL IMPRESSION: ***   From eval: Patient is a 44 y.o. female who was seen today for physical therapy evaluation and treatment for chronic low back and R knee pain.  R slump test elicits RLE symptoms which improve with repetition, R SLR negative.  Trunk and LE ROM WNL but LE and core strength deficits noted with 90/90 position  hold and 30s chair stand test.  Patient is a good PT candidate to impreove LE and core strength and promote sciatic nerve mobility.    OBJECTIVE IMPAIRMENTS: decreased activity tolerance, decreased endurance, decreased knowledge of condition, decreased mobility, improper body mechanics, obesity, and pain.   ACTIVITY LIMITATIONS: carrying, lifting, bending, and standing  PERSONAL FACTORS: Age, Behavior pattern,  and Fitness are also affecting patient's functional outcome.   REHAB POTENTIAL: Fair due to chronicity, body habitus and language barrier  CLINICAL DECISION MAKING: Stable/uncomplicated  EVALUATION COMPLEXITY: Moderate   GOALS: Goals reviewed with patient? No   SHORT TERM GOALS=LONG TERM GOALS: Target date: 08/10/2024    Patient will increase 30s chair stand reps from 8 to 10 without arms to demonstrate and improved functional ability with less pain/difficulty as well as reduce fall risk.  Baseline: 8 Goal status: INITIAL  2.  Patient will acknowledge 6/10 pain at least once during episode of care   Baseline: 10/10 Goal status: INITIAL  3.  Patient will score at least 8/50 on ODI to signify clinically meaningful improvement in functional abilities.   Baseline: 15/50 Goal status: INITIAL  4.  Patient to demonstrate independence in HEP  Baseline: 27A2QB2T Goal status: INITIAL  5.  Negative R slump test Baseline: + R slump Goal status: INITIAL    PLAN:  PT FREQUENCY: 1x/week  PT DURATION: 6 weeks  PLANNED INTERVENTIONS: 97110-Therapeutic exercises, 97530- Therapeutic activity, [id number]- Neuromuscular re-education, 97535- Self Care, 02859- Manual therapy, and Patient/Family education.  PLAN FOR NEXT SESSION: HEP review and update, manual techniques as appropriate, aerobic tasks, ROM and flexibility activities, strengthening and PREs, TPDN, gait and balance training,aquatic therapy, modalities for pain and NMRE    For all possible CPT codes, reference the Planned  Interventions line above.     Check all conditions that are expected to impact treatment: {Conditions expected to impact treatment:Diabetes mellitus      Marko Molt, PT, DPT  09/04/2024 2:51 PM  "

## 2024-09-21 ENCOUNTER — Ambulatory Visit

## 2024-09-21 DIAGNOSIS — M25561 Pain in right knee: Secondary | ICD-10-CM | POA: Diagnosis present

## 2024-09-21 DIAGNOSIS — G8929 Other chronic pain: Secondary | ICD-10-CM | POA: Diagnosis present

## 2024-09-21 DIAGNOSIS — M5459 Other low back pain: Secondary | ICD-10-CM

## 2024-09-21 NOTE — Therapy (Signed)
 " OUTPATIENT PHYSICAL THERAPY NOTE RECERTIFICATION   Patient Name: Lisa [name] MRN: [id number] DOB:16-Aug-1981, 44 y.o., female Today's Date: 09/21/2024  END OF SESSION:   PT End of Session - 09/21/24 1629     Visit Number 5    Number of Visits 11    Date for Recertification  11/02/24    Authorization Type [employer]    PT Start Time 1623    PT Stop Time 1658    PT Time Calculation (min) 35 min           Past Medical History:  Diagnosis Date   COVID-19    Diabetes mellitus (HCC)    Iron  deficiency anemia 06/01/2023   Morbid obesity (HCC)    No past surgical history on file. Patient Active Problem List   Diagnosis Date Noted   Itching 07/03/2024   Screening mammogram for breast cancer 07/03/2024   Pain of left heel 05/08/2024   Fatigue 05/08/2024   Blurred vision 05/08/2024   Chronic pain of right knee 12/24/2023   Dyslipidemia, goal LDL below 70 12/24/2023   Vitamin D  deficiency 12/24/2023   Iron  deficiency anemia 06/01/2023   Obesity with serious comorbidity    Diabetes mellitus (HCC)    Type 2 diabetes mellitus without complication, without long-term current use of insulin  (HCC) 09/22/2019   Low back pain 02/28/2019   High-risk pregnancy 08/15/2012   Short cervix, antepartum 08/08/2012   Twin gestation, dichorionic diamniotic 07/18/2012   Gestational diabetes mellitus, antepartum 07/18/2012    PCP: Juanice Thomes SAUNDERS, FNP   REFERRING PROVIDER: Persons, Ronal Dragon, PA  REFERRING DIAG: [phone] (ICD-10-CM) - Chronic pain of right knee G89.29,M54.41 (ICD-10-CM) - Chronic bilateral low back pain with right-sided sciatica  Rationale for Evaluation and Treatment: Rehabilitation  THERAPY DIAG:  Other low back pain  Chronic pain of right knee  ONSET DATE: chronic  SUBJECTIVE:                                                                                                                                                                                            SUBJECTIVE STATEMENT: Assisted by in-person interpreter throughout today's session.   Patient reports back to PT with persistent R lower back and R knee pain. She states that pain is 7/10 after OTC medications. She has been partially compliant with prescribed HEP. She reports readiness to resume PT and work on home exercises to improve her symptoms. She feels most limited with her work tasks.   From eval: HPI patient is a pleasant 44 year old woman who is seen with the help of the interpreter today.  She has a chronic  history of right knee pain and right low back pain.  She does not recall any injury however she does have a job that requires standing on her feet for 8 hours a day.  Denies any fever or chills.  She has treated this with topical medicine and ibuprofen .  No real instability.  No instability in the back no loss of bowel or bladder control pain is on the right posterior back.   Review of Systems  All other systems reviewed and are negative.  PERTINENT HISTORY:  1. Chronic pain of right knee   2. Chronic bilateral low back pain with right-sided sciatica       Plan: With regards to her right knee most of her pain is medially she has no loss of stability no effusion no erythema.  Would recommend a period of physical therapy.  Will return in 4 weeks if PT does not help her much could consider an MRI I do not see significant degenerative changes on her x-ray.  With regards to her back seems sciatic in nature but her strength is intact she does have a straight leg raise.  And pain with flexion and extension.  No paresthesias.  Would again promote physical therapy and anti-inflammatory.  PAIN:  Are you having pain? Yes: NPRS scale: 5/10 Pain location: Low back and R knee Pain description: ache Aggravating factors: prolonged standing Relieving factors: lying down and meds  PRECAUTIONS: None  RED FLAGS: None   WEIGHT BEARING RESTRICTIONS: No  FALLS:  Has patient  fallen in last 6 months? No  OCCUPATION: Factory/picking   PLOF: Independent  PATIENT GOALS: To manage my symptoms  NEXT MD VISIT: 4 weeks  OBJECTIVE:  Note: Objective measures were completed at Evaluation unless otherwise noted.  DIAGNOSTIC FINDINGS:  IMPRESSION: No acute fracture or dislocation. Mild curvature of spine. No significant degenerative joint changes.     Electronically Signed   By: Craig Farr M.D.   On: 04/23/2023 11:35  PATIENT SURVEYS:  Modified ODI 15/50 on eval  MODIFIED OSWESTRY DISABILITY SCALE  Date: 09/21/2024 Score  Pain intensity 2 =  Pain medication provides me with complete relief from pain.  2. Personal care (washing, dressing, etc.) 0 =  I can take care of myself normally without causing increased pain.  3. Lifting 2 = Pain prevents me from lifting heavy weights off the floor,but I can manage if the weights are conveniently positioned (e.g. on a table)  4. Walking 0 = Pain does not prevent me from walking any distance  5. Sitting 0 =  I can sit in any chair as long as I like.  6. Standing 4 =  Pain prevents me from standing more than 10 minutes.  7. Sleeping 0 = Pain does not prevent me from sleeping well.  8. Social Life 0 = My social life is normal and does not increase my pain.  9. Traveling 4 = My pain restricts my travel to short necessary journeys under 1/2 hour.  10. Employment/ Homemaking 1 = My normal homemaking/job activities increase my pain, but I can still perform all that is required of me  Total 13/50   Interpretation of scores: Score Category Description  0-20% Minimal Disability The patient can cope with most living activities. Usually no treatment is indicated apart from advice on lifting, sitting and exercise  21-40% Moderate Disability The patient experiences more pain and difficulty with sitting, lifting and standing. Travel and social life are more difficult and they may be  disabled from work. Personal care, sexual  activity and sleeping are not grossly affected, and the patient can usually be managed by conservative means  41-60% Severe Disability Pain remains the main problem in this group, but activities of daily living are affected. These patients require a detailed investigation  61-80% Crippled Back pain impinges on all aspects of the patients life. Positive intervention is required  81-100% Bed-bound These patients are either bed-bound or exaggerating their symptoms  Bluford FORBES Zoe DELENA Karon DELENA, et al. Surgery versus conservative management of stable thoracolumbar fracture: the PRESTO feasibility RCT. [place] ([place]): [employer]; 2021 Nov. [facility] Technology Assessment, No. 25.62.) Appendix 3, Oswestry Disability Index category descriptors. Available from: Findjewelers.cz  Minimally Clinically Important Difference (MCID) = 12.8%   MUSCLE LENGTH: Hamstrings: Right 70 deg; Left 70 deg   POSTURE: rounded shoulders  PALPATION: deferred  LUMBAR ROM:   AROM eval  Flexion WNL  Extension WNL  Right lateral flexion   Left lateral flexion   Right rotation WNL  Left rotation WNL   (Blank rows = not tested)  LOWER EXTREMITY ROM:   WFL for gait and transfers   LOWER EXTREMITY MMT:    MMT Right 09/21/2024 Left 09/21/2024  Hip flexion 5 4+  Hip extension    Hip abduction 4+ 4+  Hip adduction 5 5  Hip internal rotation 4 4  Hip external rotation 4+ 4  Knee flexion 5 5  Knee extension 5 4  Ankle dorsiflexion    Ankle plantarflexion    Ankle inversion    Ankle eversion     (Blank rows = not tested)  LUMBAR SPECIAL TESTS:  Straight leg raise test: Negative and Slump test: symptomatic R  FUNCTIONAL TESTS:  30 seconds chair stand test 8 reps arms crossed   09/21/2024: 9 reps  GAIT: Distance walked: 42ftx2 Assistive device utilized: None Level of assistance: Complete Independence Comments: unremarkable  TREATMENT:         [facility] Adult PT  Treatment:                                                DATE: 09/21/2024  Therapeutic Activity:  Reassessment of objective measures and subjective assessment regarding progress towards established goals and updated plan for addressing remaining deficits and rehab goals.  Reviewed and updated HEP Administered ODI with assistance of interpreter and clinican                                                                                                                      [facility] Adult PT Treatment:                                                DATE:  08/08/2024  Nustep L6 x 10 min UEs/LEs Supine LTR x10 Supine 90/90 abdominal bracing 3x15 90/90 marches up up, down down  Supine SLR with abdominal bracing x15 each Supine bridge 2x10 Supine clamshell/knee fall out red TB 2x10 Sit<>stand x8 Sitting hamstring stretch x30 Sitting sciatic nerve tensioner x10  Did not perform today: Sitting figure 4 stretch 2x30 Prone quad stretch x 30 with strap Prone hamstring curl red TB 2x10   PATIENT EDUCATION:  Education details: Discussed eval findings, rehab rationale and POC and patient is in agreement  Person educated: Patient Education method: Explanation and Handouts Education comprehension: verbalized understanding and needs further education  HOME EXERCISE PROGRAM: Access Code: [email or web address] URL: [email or web address] Date: 07/18/2024 Prepared by: Gellen April Earnie Starring  Exercises - Supine 90/90 Abdominal Bracing  - 2 x daily - 5 x weekly - 1 sets - 2 reps - 30s hold - Seated Sciatic Tensioner  - 2 x daily - 5 x weekly - 2 sets - 10 reps - Sit to Stand with Arms Crossed  - 2 x daily - 5 x weekly - 1 sets - 5 reps - Supine Bridge  - 2 x daily - 7 x weekly - 2 sets - 10 reps - Hooklying Isometric Clamshell  - 1 x daily - 7 x weekly - 2 sets - 10 reps - Supine Pelvic Tilt with Straight Leg Raise  - 1 x daily - 7 x weekly - 2 sets - 10 reps  ASSESSMENT:  CLINICAL  IMPRESSION: Patient has attended 5 PT sessions to address persistent LBP and R knee pain. She has made small improvements with self-reported disability index, and improved 30 sec STS test. She continues to have LE strength deficits, core strength deficits, and is limited by pain with work activitiesShe requires ongoing skilled PT intervention in order to address remaining deficits and progress towards functional rehab goals.    From eval: Patient is a 44 y.o. female who was seen today for physical therapy evaluation and treatment for chronic low back and R knee pain.  R slump test elicits RLE symptoms which improve with repetition, R SLR negative.  Trunk and LE ROM WNL but LE and core strength deficits noted with 90/90 position hold and 30s chair stand test.  Patient is a good PT candidate to impreove LE and core strength and promote sciatic nerve mobility.    OBJECTIVE IMPAIRMENTS: decreased activity tolerance, decreased endurance, decreased knowledge of condition, decreased mobility, improper body mechanics, obesity, and pain.   ACTIVITY LIMITATIONS: carrying, lifting, bending, and standing  PERSONAL FACTORS: Age, Behavior pattern, and Fitness are also affecting patient's functional outcome.   REHAB POTENTIAL: Fair due to chronicity, body habitus and language barrier  CLINICAL DECISION MAKING: Stable/uncomplicated  EVALUATION COMPLEXITY: Moderate   GOALS: Goals reviewed with patient? No   SHORT TERM GOALS=LONG TERM GOALS: Target date: 11/02/2024, last updated 09/21/2024    Patient will increase 30s chair stand reps from 8 to 10 without arms to demonstrate and improved functional ability with less pain/difficulty as well as reduce fall risk.  Baseline: 8 09/21/2024: 9 Goal status: progressing   2.  Patient will acknowledge 6/10 pain at least once during episode of care   Baseline: 10/10 09/21/2024: 7/10 or greater unless taking OTC pain medication  Goal status: progressing  3.  Patient  will score at least 8/50 on ODI to signify clinically meaningful improvement in functional abilities.   Baseline: 15/50 09/21/2024: 13/50 Goal status:progressing  4.  Patient to demonstrate  independence in HEP  Baseline: 27A2QB2T Goal status: progressing  5.  Negative R slump test Baseline: + R slump Goal status:progressing     PLAN:  PT FREQUENCY: 1x/week  PT DURATION: 6 weeks  PLANNED INTERVENTIONS: 97164- PT Re-evaluation, 97750- Physical Performance Testing, 97110-Therapeutic exercises, 97530- Therapeutic activity, [id number]- Neuromuscular re-education, 97535- Self Care, 02859- Manual therapy, G0283- Electrical stimulation (unattended), 02987- Traction (mechanical), Patient/Family education, Joint mobilization, Spinal mobilization, Cryotherapy, and Moist heat.  PLAN FOR NEXT SESSION: review and update HEP as indicated; address core strength, LE strength, R LE neural mobility; other interventions for pain modulation as appropriate; patient education    For all possible CPT codes, reference the Planned Interventions line above.     Check all conditions that are expected to impact treatment: {Conditions expected to impact treatment:Diabetes mellitus      Marko Molt, PT, DPT  09/22/2024 9:24 AM   "

## 2024-10-04 ENCOUNTER — Ambulatory Visit

## 2024-10-10 ENCOUNTER — Ambulatory Visit

## 2024-10-17 ENCOUNTER — Ambulatory Visit

## 2024-10-24 ENCOUNTER — Ambulatory Visit
